# Patient Record
Sex: Female | Born: 1950 | Race: Black or African American | Hispanic: No | Marital: Single | Smoking: Former smoker
Health system: Southern US, Community
[De-identification: ages and names within clinical notes are randomized; demographics above are authoritative.]

## PROBLEM LIST (undated history)

## (undated) DIAGNOSIS — K5790 Diverticulosis of intestine, part unspecified, without perforation or abscess without bleeding: Secondary | ICD-10-CM

## (undated) DIAGNOSIS — D3502 Benign neoplasm of left adrenal gland: Secondary | ICD-10-CM

## (undated) DIAGNOSIS — E042 Nontoxic multinodular goiter: Secondary | ICD-10-CM

## (undated) DIAGNOSIS — K649 Unspecified hemorrhoids: Secondary | ICD-10-CM

## (undated) DIAGNOSIS — C539 Malignant neoplasm of cervix uteri, unspecified: Secondary | ICD-10-CM

## (undated) DIAGNOSIS — E785 Hyperlipidemia, unspecified: Secondary | ICD-10-CM

## (undated) DIAGNOSIS — I517 Cardiomegaly: Secondary | ICD-10-CM

## (undated) DIAGNOSIS — Z8679 Personal history of other diseases of the circulatory system: Secondary | ICD-10-CM

## (undated) DIAGNOSIS — C189 Malignant neoplasm of colon, unspecified: Secondary | ICD-10-CM

## (undated) DIAGNOSIS — Z9889 Other specified postprocedural states: Secondary | ICD-10-CM

## (undated) DIAGNOSIS — E119 Type 2 diabetes mellitus without complications: Secondary | ICD-10-CM

## (undated) DIAGNOSIS — R112 Nausea with vomiting, unspecified: Secondary | ICD-10-CM

## (undated) DIAGNOSIS — Z803 Family history of malignant neoplasm of breast: Secondary | ICD-10-CM

## (undated) DIAGNOSIS — I10 Essential (primary) hypertension: Secondary | ICD-10-CM

## (undated) DIAGNOSIS — I519 Heart disease, unspecified: Secondary | ICD-10-CM

## (undated) DIAGNOSIS — R7309 Other abnormal glucose: Secondary | ICD-10-CM

## (undated) HISTORY — DX: Malignant neoplasm of colon, unspecified: C18.9

## (undated) HISTORY — DX: Other abnormal glucose: R73.09

## (undated) HISTORY — DX: Family history of malignant neoplasm of breast: Z80.3

## (undated) HISTORY — PX: DILATION AND CURETTAGE OF UTERUS: SHX78

## (undated) HISTORY — DX: Type 2 diabetes mellitus without complications: E11.9

## (undated) HISTORY — PX: ADENOIDECTOMY: SUR15

## (undated) HISTORY — DX: Hyperlipidemia, unspecified: E78.5

---

## 2001-02-08 DIAGNOSIS — Z8679 Personal history of other diseases of the circulatory system: Secondary | ICD-10-CM

## 2001-02-08 HISTORY — DX: Personal history of other diseases of the circulatory system: Z86.79

## 2001-02-28 ENCOUNTER — Ambulatory Visit (HOSPITAL_COMMUNITY): Admission: RE | Admit: 2001-02-28 | Discharge: 2001-02-28 | Payer: Self-pay | Admitting: Family Medicine

## 2001-02-28 ENCOUNTER — Encounter: Payer: Self-pay | Admitting: Family Medicine

## 2001-03-20 ENCOUNTER — Encounter: Admission: RE | Admit: 2001-03-20 | Discharge: 2001-03-20 | Payer: Self-pay | Admitting: Family Medicine

## 2001-03-20 ENCOUNTER — Encounter: Payer: Self-pay | Admitting: Family Medicine

## 2002-06-28 ENCOUNTER — Emergency Department (HOSPITAL_COMMUNITY): Admission: EM | Admit: 2002-06-28 | Discharge: 2002-06-28 | Payer: Self-pay | Admitting: Emergency Medicine

## 2002-10-09 ENCOUNTER — Encounter: Admission: RE | Admit: 2002-10-09 | Discharge: 2002-10-09 | Payer: Self-pay | Admitting: Family Medicine

## 2002-10-09 ENCOUNTER — Encounter: Payer: Self-pay | Admitting: Family Medicine

## 2004-03-27 ENCOUNTER — Other Ambulatory Visit: Admission: RE | Admit: 2004-03-27 | Discharge: 2004-03-27 | Payer: Self-pay | Admitting: Family Medicine

## 2006-05-26 ENCOUNTER — Other Ambulatory Visit: Admission: RE | Admit: 2006-05-26 | Discharge: 2006-05-26 | Payer: Self-pay | Admitting: Family Medicine

## 2006-09-02 ENCOUNTER — Ambulatory Visit (HOSPITAL_COMMUNITY): Admission: RE | Admit: 2006-09-02 | Discharge: 2006-09-02 | Payer: Self-pay | Admitting: Obstetrics and Gynecology

## 2006-09-02 ENCOUNTER — Encounter (INDEPENDENT_AMBULATORY_CARE_PROVIDER_SITE_OTHER): Payer: Self-pay | Admitting: Obstetrics and Gynecology

## 2006-09-02 HISTORY — PX: HYSTEROSCOPY WITH D & C: SHX1775

## 2008-02-06 ENCOUNTER — Encounter: Admission: RE | Admit: 2008-02-06 | Discharge: 2008-02-06 | Payer: Self-pay | Admitting: Family Medicine

## 2010-06-18 ENCOUNTER — Other Ambulatory Visit (HOSPITAL_COMMUNITY): Payer: Self-pay | Admitting: Obstetrics and Gynecology

## 2010-06-18 DIAGNOSIS — E049 Nontoxic goiter, unspecified: Secondary | ICD-10-CM

## 2010-06-23 NOTE — Op Note (Signed)
[name], Toni Peterson              ACCOUNT NO.:  [id number]   MEDICAL RECORD NO.:  [id number]          PATIENT TYPE:  AMB   LOCATION:  SDC                           FACILITY:  [facility]   PHYSICIAN:  Juluis Mire, M.D.   DATE OF BIRTH:  1950/11/23   DATE OF PROCEDURE:  09/02/2006  DATE OF DISCHARGE:                               OPERATIVE REPORT   PREOPERATIVE DIAGNOSIS:  Post menopausal bleeding with evidence of  endometrial outgrowths.   POSTOPERATIVE DIAGNOSIS:  Post menopausal bleeding with evidence of  endometrial outgrowths and with evidence of a submucosal fibroid.  No  evidence of polyp.   PROCEDURE:  Paracervical block using Nesacaine, hysteroscopy with  multiple endometrial biopsies and curettings.   SURGEON:  Juluis Mire, M.D.   ANESTHESIA:  General.   ESTIMATED BLOOD LOSS:  Minimal.   PACKS AND DRAINS:  None.   BLOOD REPLACED:  None.   COMPLICATIONS:  None.   INDICATIONS:  See Dictated history and physical.   PROCEDURE IN DETAIL:  The patient was taken to the OR and placed in a  supine position.  After a satisfactory level of general anesthesia was  obtained, the patient was placed in the dorsal lithotomy position using  the Kober stirrups.  The perineum and vagina were prepped out with  Betadine and draped in a sterile field.  A speculum was placed in the  vaginal vault.  The cervix was grasped with a single tooth tenaculum.  A  paracervical block was instituted using 1% Nesacaine.  The uterus  sounded to 8 cm.  The cervix was serially dilated to a size 35 Pratt  dilator.  The operative hysteroscope was introduced.  Visualization  revealed some old clot in the intrauterine cavity that was removed.  Visualization revealed a smooth atrophic endometrium but she did have a  large submucosal fibroid extending from the right side of the uterus and  posteriorly. We did multiple biopsies of the fibroids as well as  multiple endometrial biopsies.  I could not see any  polyps that had been  noted on saline infusion, this may have just been part of the fibroid.  At this point in time, we endometrial curettings.  Total deficit was 60  mL.  We had no active  bleeding or signs of perforation.  The single tooth tenaculum and  speculum were then removed.  The patient was taken out of the dorsal  lithotomy position, once alert and extubated, was transferred to the  recovery room in good condition.      Juluis Mire, M.D.  Electronically Signed     JSM/MEDQ  D:  09/02/2006  T:  09/03/2006  Job:  [id number]

## 2010-06-23 NOTE — H&P (Signed)
NAME:  Toni, Peterson NO.:  [id number]   MEDICAL RECORD NO.:  [id number]          PATIENT TYPE:  AMB   LOCATION:  SDC                           [facility]:  [facility]   PHYSICIAN:  Juluis Mire, M.D.   DATE OF BIRTH:  August 03, 1950   DATE OF ADMISSION:  09/02/2006  DATE OF DISCHARGE:                              HISTORY & PHYSICAL   The patient is a 60-year gravida 5, para 1, abortus 4, married female,  presents for hysteroscopy.  She was referred to our practice for  postmenopausal bleeding.  A saline infusion ultrasound did reveal a  large intrauterine fibroid with myometrial component, but she also had a  couple of polyps noted.  In view of this now, she presents for  hysteroscopic evaluation.   ALLERGIES:  IN TERMS OF ALLERGIES, SHE IS INTOLERANT TO DILACOR, NORVASC  AND BIAXIN.   PAST MEDICAL HISTORY:  She is being actively managed by Dr. Merri Brunette for hypertension.   PAST SURGICAL HISTORY:  She has had an adenoidectomy.   OBSTETRICAL HISTORY:  Had one vaginal delivery.  The rest were  miscarriages.   SOCIAL HISTORY:  She has no history of tobacco use.  Does have rare  alcohol use.   FAMILY HISTORY:  Positive for hypertension in mother, brother, father at  maternal grandmother.  There is also history of coronary artery disease  in her mother at age 82.  Brother had a stroke at age 85.   REVIEW OF SYSTEMS:  Noncontributory.   PHYSICAL EXAMINATION:  The patient is afebrile, stable vital signs.  HEENT:  The patient is normocephalic.  Pupils equal, round, reactive to  light and accommodation.  Extraocular movements were intact.  Sclerae  and conjunctivae are clear.  Oropharynx clear.  NECK:  Without thyromegaly.  BREASTS:  Not examined.  LUNGS:  Clear.  CARDIAC SYSTEM:  Regular rate without murmurs or gallops.  ABDOMINAL EXAM:  Benign.  No mass, organomegaly or tenderness.  PELVIC:  Normal external genitalia.  Vaginal mucosa clear.  Cervix  remarkable.   Uterus upper limits of normal size.  Adnexa unremarkable.  EXTREMITIES:  Trace edema.  NEUROLOGIC:  Grossly within normal limits.   IMPRESSION:  1. Postmenopausal bleeding with endometrial polyp and fibroid.  2. Hypertension.   PLAN:  The patient to undergo hysteroscopic evaluation with resection of  polyp.  The risks of surgery have been discussed, including the risk of  infection.  The risk of hemorrhage could require transfusion with the  risk of AIDS or hepatitis.  Risk of injury to adjacent organs through  perforation could require exploratory surgery, risk of deep venous  thrombosis and pulmonary embolus.  The patient expressed understanding  of indications and risks.      Juluis Mire, M.D.  Electronically Signed     JSM/MEDQ  D:  09/02/2006  T:  09/02/2006  Job:  [id number]

## 2010-06-25 ENCOUNTER — Other Ambulatory Visit (HOSPITAL_COMMUNITY): Payer: Self-pay

## 2010-07-02 ENCOUNTER — Other Ambulatory Visit (HOSPITAL_COMMUNITY): Payer: Self-pay

## 2010-07-02 ENCOUNTER — Ambulatory Visit (HOSPITAL_COMMUNITY)
Admission: RE | Admit: 2010-07-02 | Discharge: 2010-07-02 | Disposition: A | Discharge status: Home / Self Care | Payer: BC Managed Care – PPO | Source: Ambulatory Visit | Attending: Obstetrics and Gynecology | Admitting: Obstetrics and Gynecology

## 2010-07-02 DIAGNOSIS — E049 Nontoxic goiter, unspecified: Secondary | ICD-10-CM

## 2010-07-02 DIAGNOSIS — E042 Nontoxic multinodular goiter: Secondary | ICD-10-CM | POA: Insufficient documentation

## 2010-07-24 ENCOUNTER — Other Ambulatory Visit: Payer: Self-pay | Admitting: Obstetrics and Gynecology

## 2010-07-24 DIAGNOSIS — R928 Other abnormal and inconclusive findings on diagnostic imaging of breast: Secondary | ICD-10-CM

## 2010-07-28 ENCOUNTER — Other Ambulatory Visit: Payer: Self-pay | Admitting: Obstetrics and Gynecology

## 2010-07-28 DIAGNOSIS — R928 Other abnormal and inconclusive findings on diagnostic imaging of breast: Secondary | ICD-10-CM

## 2010-07-30 ENCOUNTER — Other Ambulatory Visit: Payer: Self-pay | Admitting: Obstetrics and Gynecology

## 2010-07-30 DIAGNOSIS — R928 Other abnormal and inconclusive findings on diagnostic imaging of breast: Secondary | ICD-10-CM

## 2010-08-07 ENCOUNTER — Ambulatory Visit
Admission: RE | Admit: 2010-08-07 | Discharge: 2010-08-07 | Disposition: A | Discharge status: Home / Self Care | Payer: BC Managed Care – PPO | Source: Ambulatory Visit | Attending: Obstetrics and Gynecology | Admitting: Obstetrics and Gynecology

## 2010-08-07 DIAGNOSIS — R928 Other abnormal and inconclusive findings on diagnostic imaging of breast: Secondary | ICD-10-CM

## 2010-08-14 ENCOUNTER — Other Ambulatory Visit: Payer: Self-pay | Admitting: Endocrinology

## 2010-08-14 DIAGNOSIS — E041 Nontoxic single thyroid nodule: Secondary | ICD-10-CM

## 2010-08-21 ENCOUNTER — Inpatient Hospital Stay (HOSPITAL_COMMUNITY): Admission: RE | Admit: 2010-08-21 | Payer: BC Managed Care – PPO | Source: Ambulatory Visit

## 2010-08-25 ENCOUNTER — Ambulatory Visit
Admission: RE | Admit: 2010-08-25 | Discharge: 2010-08-25 | Disposition: A | Discharge status: Home / Self Care | Payer: BC Managed Care – PPO | Source: Ambulatory Visit | Attending: Endocrinology | Admitting: Endocrinology

## 2010-08-25 ENCOUNTER — Other Ambulatory Visit (HOSPITAL_COMMUNITY)
Admission: RE | Admit: 2010-08-25 | Discharge: 2010-08-25 | Disposition: A | Discharge status: Home / Self Care | Payer: BC Managed Care – PPO | Source: Ambulatory Visit | Attending: Interventional Radiology | Admitting: Interventional Radiology

## 2010-08-25 DIAGNOSIS — E049 Nontoxic goiter, unspecified: Secondary | ICD-10-CM | POA: Insufficient documentation

## 2010-08-25 DIAGNOSIS — E041 Nontoxic single thyroid nodule: Secondary | ICD-10-CM

## 2010-08-28 ENCOUNTER — Encounter (HOSPITAL_COMMUNITY): Admission: RE | Payer: Self-pay | Source: Ambulatory Visit

## 2010-08-28 ENCOUNTER — Ambulatory Visit (HOSPITAL_COMMUNITY)
Admission: RE | Admit: 2010-08-28 | Payer: BC Managed Care – PPO | Source: Ambulatory Visit | Admitting: Obstetrics and Gynecology

## 2010-08-28 SURGERY — DILATATION & CURETTAGE/HYSTEROSCOPY WITH RESECTOCOPE
Anesthesia: General

## 2010-09-01 ENCOUNTER — Other Ambulatory Visit: Payer: Self-pay | Admitting: Obstetrics and Gynecology

## 2010-11-23 LAB — CBC
HCT: 35.7 — ABNORMAL LOW
MCHC: 33.5
MCV: 93.6
Platelets: 339
RBC: 3.81 — ABNORMAL LOW

## 2011-02-09 HISTORY — PX: COLONOSCOPY: SHX174

## 2011-03-05 ENCOUNTER — Encounter (HOSPITAL_BASED_OUTPATIENT_CLINIC_OR_DEPARTMENT_OTHER): Payer: Self-pay | Admitting: *Deleted

## 2011-03-08 ENCOUNTER — Encounter (HOSPITAL_BASED_OUTPATIENT_CLINIC_OR_DEPARTMENT_OTHER): Payer: Self-pay | Admitting: *Deleted

## 2011-03-08 NOTE — Progress Notes (Signed)
Pt instructed NPO p MN 1/30 x atenolol w/ sip of h20.  To [facility] 1/31 @ 0600.  Needs ekg, cbc, cmet, serum hcg on arrival.

## 2011-03-08 NOTE — H&P (Signed)
  Patient name  Toni Peterson DICTATION#  [id number] CSN# [id number]  Juluis Mire, MD 03/08/2011 11:59 AM

## 2011-03-09 NOTE — H&P (Signed)
Toni Peterson, Toni Peterson NO.:  [id number]  MEDICAL RECORD NO.:  [id number]  LOCATION:                               FACILITY:  [facility]  PHYSICIAN:  Juluis Mire, M.D.   DATE OF BIRTH:  1950/05/02  DATE OF ADMISSION:  03/11/2011 DATE OF DISCHARGE:                             HISTORY & PHYSICAL   DATE OF SURGERY:  March 11, 2011.  Surgery is going to be done at [facility] outpatient area on [facility].  HISTORY:  The patient is a 61 year old, gravida 5, para 1, abortus 4 postmenopausal patient who presents for hysteroscopy and D and C.  The patient has been having trouble with abnormal bleeding.  Previous blood tests were consistent with menopause.  A saline infusion ultrasound had been done previously and revealed an endometrial polyp. She was scheduled for hysteroscopy, which she never followed through with.  We repeated the saline infusion ultrasound on January 17.  She had submucosal fibroid and this was seen previously.  It had enlarged slightly, but she had an enlarging apparent endometrial polyp now measuring 2.2 cm.  In view of that she now presents for hysteroscopic resection.  ALLERGIES:  No known drug allergies.  MEDICATIONS:  She is on enalapril, hydrochlorothiazide, Tenormin and Crestor.  PAST MEDICAL HISTORY:  She has a history of hypertension, hypercholesterolemia.  She is under active management for this.  PAST SURGICAL HISTORY:  She has had a previous hysteroscopic evaluation resection.  She has had a previous adenoidectomy.  OBSTETRICAL HISTORY:  She has had 1 vaginal delivery.  The rest 4 miscarriages.  SOCIAL HISTORY:  Reveals no tobacco or alcohol use.  FAMILY HISTORY:  She has a family history of hypertension in her mother. Also brother, father, maternal grandmother had hypertension.  History of coronary artery disease in her mother at age 87.  Brother had a stroke at age 69.  REVIEW OF SYSTEMS:   Noncontributory.  PHYSICAL EXAMINATION:  VITAL SIGNS:  The patient is afebrile, stable vital signs. HEENT:  The patient is normocephalic.  Pupils equal, round, reactive to light and accommodation.  Extraocular movements intact.  Sclerae clear. Oropharynx clear. NECK:  Without thyromegaly. BREASTS:  No discrete masses. LUNGS:  Clear. CARDIOVASCULAR:  Regular rhythm, rate.  There are no murmurs or gallops. ABDOMINAL:  Exam is benign.  No masses, organomegaly, or tenderness. PELVIC:  Normal external genitalia.  Vaginal mucosa clear.  Cervix unremarkable.  Uterus, upper limits of normal size.  Adnexa are unremarkable. EXTREMITIES:  Trace edema. NEUROLOGIC:  Exam is grossly within normal limits.  IMPRESSION: 1. Large endometrial polyp. 2. Submucosal fibroid. 3. Hypertension. 4. Hypercholesterolemia.  PLAN:  The patient will undergo a hysteroscopic evaluation with resection along with dilation and curettage.  Risks of procedure have been discussed including the risk of infection.  Risk of vascular injury that could lead to hemorrhage requiring transfusion with the risk of AIDS or hepatitis.  Excessive bleeding could require hysterectomy. There is a risk of uterine perforation leading to injury to adjacent organs such as bowel, bladder, ureters that could require further exploratory surgery.  Risk of deep venous thrombosis and pulmonary emboli.  The patient does understand  potential risks, and complications.     Juluis Mire, M.D.     JSM/MEDQ  D:  03/08/2011  T:  03/08/2011  Job:  [id number]

## 2011-03-10 NOTE — Anesthesia Preprocedure Evaluation (Addendum)
Anesthesia Evaluation  Patient identified by MRN, date of birth, ID band Patient awake    Reviewed: Allergy & Precautions, H&P , NPO status , Patient's Chart, lab work & pertinent test results, reviewed documented beta blocker date and time   History of Anesthesia Complications (+) PONV  Airway Mallampati: II TM Distance: >3 FB Neck ROM: full    Dental No notable dental hx. (+) Teeth Intact and Dental Advisory Given   Pulmonary neg pulmonary ROS,  clear to auscultation  Pulmonary exam normal       Cardiovascular Exercise Tolerance: Good hypertension, On Home Beta Blockers regular Normal Junctional rhythm on ECG.   Neuro/Psych Negative Neurological ROS  Negative Psych ROS   GI/Hepatic negative GI ROS, Neg liver ROS,   Endo/Other  Negative Endocrine ROS  Renal/GU negative Renal ROS  Genitourinary negative   Musculoskeletal   Abdominal   Peds  Hematology negative hematology ROS (+)   Anesthesia Other Findings   Reproductive/Obstetrics negative OB ROS                          Anesthesia Physical Anesthesia Plan  ASA: II  Anesthesia Plan: General   Post-op Pain Management:    Induction: Intravenous  Airway Management Planned: LMA  Additional Equipment:   Intra-op Plan:   Post-operative Plan:   Informed Consent: I have reviewed the patients History and Physical, chart, labs and discussed the procedure including the risks, benefits and alternatives for the proposed anesthesia with the patient or authorized representative who has indicated his/her understanding and acceptance.   Dental Advisory Given  Plan Discussed with: CRNA and Surgeon  Anesthesia Plan Comments:         Anesthesia Quick Evaluation

## 2011-03-11 ENCOUNTER — Other Ambulatory Visit: Payer: Self-pay | Admitting: Obstetrics and Gynecology

## 2011-03-11 ENCOUNTER — Other Ambulatory Visit: Payer: Self-pay

## 2011-03-11 ENCOUNTER — Encounter (HOSPITAL_BASED_OUTPATIENT_CLINIC_OR_DEPARTMENT_OTHER)
Admission: RE | Disposition: A | Discharge status: Home / Self Care | Payer: Self-pay | Source: Ambulatory Visit | Attending: Obstetrics and Gynecology

## 2011-03-11 ENCOUNTER — Ambulatory Visit (HOSPITAL_BASED_OUTPATIENT_CLINIC_OR_DEPARTMENT_OTHER): Payer: BC Managed Care – PPO | Admitting: Anesthesiology

## 2011-03-11 ENCOUNTER — Encounter (HOSPITAL_BASED_OUTPATIENT_CLINIC_OR_DEPARTMENT_OTHER): Payer: Self-pay | Admitting: Anesthesiology

## 2011-03-11 ENCOUNTER — Ambulatory Visit (HOSPITAL_BASED_OUTPATIENT_CLINIC_OR_DEPARTMENT_OTHER)
Admission: RE | Admit: 2011-03-11 | Discharge: 2011-03-11 | Disposition: A | Discharge status: Home / Self Care | Payer: BC Managed Care – PPO | Source: Ambulatory Visit | Attending: Obstetrics and Gynecology | Admitting: Obstetrics and Gynecology

## 2011-03-11 ENCOUNTER — Encounter (HOSPITAL_BASED_OUTPATIENT_CLINIC_OR_DEPARTMENT_OTHER): Payer: Self-pay | Admitting: *Deleted

## 2011-03-11 DIAGNOSIS — D250 Submucous leiomyoma of uterus: Secondary | ICD-10-CM | POA: Insufficient documentation

## 2011-03-11 DIAGNOSIS — Z79899 Other long term (current) drug therapy: Secondary | ICD-10-CM | POA: Insufficient documentation

## 2011-03-11 DIAGNOSIS — E7800 Pure hypercholesterolemia, unspecified: Secondary | ICD-10-CM | POA: Insufficient documentation

## 2011-03-11 DIAGNOSIS — D259 Leiomyoma of uterus, unspecified: Secondary | ICD-10-CM

## 2011-03-11 DIAGNOSIS — I10 Essential (primary) hypertension: Secondary | ICD-10-CM | POA: Insufficient documentation

## 2011-03-11 HISTORY — DX: Nausea with vomiting, unspecified: R11.2

## 2011-03-11 HISTORY — DX: Other specified postprocedural states: Z98.890

## 2011-03-11 HISTORY — DX: Essential (primary) hypertension: I10

## 2011-03-11 HISTORY — PX: DILATION AND CURETTAGE OF UTERUS: SHX78

## 2011-03-11 HISTORY — PX: HYSTEROSCOPY WITH RESECTOSCOPE: SHX5395

## 2011-03-11 LAB — CBC
HCT: 35.8 % — ABNORMAL LOW (ref 36.0–46.0)
MCHC: 33.2 g/dL (ref 30.0–36.0)
RDW: 14.3 % (ref 11.5–15.5)

## 2011-03-11 LAB — COMPREHENSIVE METABOLIC PANEL
ALT: 18 U/L (ref 0–35)
Calcium: 9.8 mg/dL (ref 8.4–10.5)
GFR calc Af Amer: >90 mL/min (ref >90)
Glucose, Bld: 118 mg/dL — ABNORMAL HIGH (ref 70–99)
Sodium: 139 mEq/L (ref 135–145)
Total Protein: 7.2 g/dL (ref 6.0–8.3)

## 2011-03-11 LAB — HCG, SERUM, QUALITATIVE: Preg, Serum: NEGATIVE

## 2011-03-11 SURGERY — HYSTEROSCOPY, USING RESECTOSCOPE
Anesthesia: General | Site: Vagina | Wound class: Clean Contaminated

## 2011-03-11 MED ORDER — LACTATED RINGERS IV SOLN: INTRAVENOUS | Status: DC

## 2011-03-11 MED ORDER — GLYCINE 1.5 % IR SOLN
Status: DC | PRN
  Administered 2011-03-11: 3000 mL

## 2011-03-11 MED ORDER — EPHEDRINE SULFATE 50 MG/ML IJ SOLN
INTRAMUSCULAR | Status: DC | PRN
  Administered 2011-03-11: 10 mg via INTRAVENOUS

## 2011-03-11 MED ORDER — PROPOFOL 10 MG/ML IV EMUL
INTRAVENOUS | Status: DC | PRN
  Administered 2011-03-11: 200 mg via INTRAVENOUS

## 2011-03-11 MED ORDER — DEXAMETHASONE SODIUM PHOSPHATE 4 MG/ML IJ SOLN
INTRAMUSCULAR | Status: DC | PRN
  Administered 2011-03-11: 8 mg via INTRAVENOUS

## 2011-03-11 MED ORDER — OXYCODONE-ACETAMINOPHEN 5-325 MG PO TABS
1.0000 | ORAL_TABLET | ORAL | Status: AC | PRN
  Administered 2011-03-11: 1 via ORAL

## 2011-03-11 MED ORDER — LIDOCAINE HCL 1 % IJ SOLN
INTRAMUSCULAR | Status: DC | PRN
  Administered 2011-03-11: 18 mL

## 2011-03-11 MED ORDER — MIDAZOLAM HCL 5 MG/5ML IJ SOLN
INTRAMUSCULAR | Status: DC | PRN
  Administered 2011-03-11: 2 mg via INTRAVENOUS

## 2011-03-11 MED ORDER — ONDANSETRON HCL 4 MG/2ML IJ SOLN
INTRAMUSCULAR | Status: DC | PRN
  Administered 2011-03-11: 4 mg via INTRAVENOUS

## 2011-03-11 MED ORDER — LIDOCAINE HCL (CARDIAC) 20 MG/ML IV SOLN
INTRAVENOUS | Status: DC | PRN
  Administered 2011-03-11: 100 mg via INTRAVENOUS

## 2011-03-11 MED ORDER — FENTANYL CITRATE 0.05 MG/ML IJ SOLN
INTRAMUSCULAR | Status: DC | PRN
  Administered 2011-03-11: 50 ug via INTRAVENOUS
  Administered 2011-03-11: 25 ug via INTRAVENOUS

## 2011-03-11 MED ORDER — PROMETHAZINE HCL 25 MG/ML IJ SOLN: 6.2500 mg | INTRAMUSCULAR | Status: DC | PRN

## 2011-03-11 MED ORDER — FENTANYL CITRATE 0.05 MG/ML IJ SOLN: 25.0000 ug | INTRAMUSCULAR | Status: DC | PRN

## 2011-03-11 MED ORDER — LACTATED RINGERS IV SOLN
INTRAVENOUS | Status: DC
  Administered 2011-03-11: 100 mL/h via INTRAVENOUS

## 2011-03-11 SURGICAL SUPPLY — 36 items
CANISTER SUCTION 2500CC (MISCELLANEOUS) ×2 IMPLANT
CATH ROBINSON RED A/P 16FR (CATHETERS) IMPLANT
CLOTH BEACON ORANGE TIMEOUT ST (SAFETY) ×2 IMPLANT
CORD ACTIVE DISPOSABLE (ELECTRODE) ×1
CORD ELECTRO ACTIVE DISP (ELECTRODE) IMPLANT
COVER TABLE BACK 60X90 (DRAPES) ×2 IMPLANT
DRAPE CAMERA CLOSED 9X96 (DRAPES) ×2 IMPLANT
DRAPE LG THREE QUARTER DISP (DRAPES) ×2 IMPLANT
DRESSING TELFA 8X3 (GAUZE/BANDAGES/DRESSINGS) ×2 IMPLANT
ELECT LOOP GYNE PRO 24FR (CUTTING LOOP)
ELECT REM PT RETURN 9FT ADLT (ELECTROSURGICAL) ×2
ELECT VAPORTRODE GRVD BAR (ELECTRODE) IMPLANT
ELECTRODE LOOP GYNE PRO 24FR (CUTTING LOOP) IMPLANT
ELECTRODE REM PT RTRN 9FT ADLT (ELECTROSURGICAL) ×1 IMPLANT
ELECTRODE ROLLER BARREL 22FR (ELECTROSURGICAL) IMPLANT
ELECTRODE VAPORCUT 22FR (ELECTROSURGICAL) IMPLANT
GLOVE BIO SURGEON STRL SZ7 (GLOVE) ×4 IMPLANT
GLOVE BIOGEL PI IND STRL 6.5 (GLOVE) IMPLANT
GLOVE BIOGEL PI IND STRL 7.0 (GLOVE) IMPLANT
GLOVE BIOGEL PI INDICATOR 6.5 (GLOVE) ×2
GLOVE BIOGEL PI INDICATOR 7.0 (GLOVE) ×1
GLOVE ECLIPSE 7.0 STRL STRAW (GLOVE) ×1 IMPLANT
GOWN PREVENTION PLUS LG XLONG (DISPOSABLE) ×4 IMPLANT
GOWN STRL NON-REIN LRG LVL3 (GOWN DISPOSABLE) ×2 IMPLANT
LEGGING LITHOTOMY PAIR STRL (DRAPES) ×2 IMPLANT
LOOP ANGLED CUTTING 22FR (CUTTING LOOP) ×1 IMPLANT
NDL SPNL 18GX3.5 QUINCKE PK (NEEDLE) IMPLANT
NDL SPNL 22GX3.5 QUINCKE BK (NEEDLE) IMPLANT
NEEDLE SPNL 18GX3.5 QUINCKE PK (NEEDLE) ×2 IMPLANT
NEEDLE SPNL 22GX3.5 QUINCKE BK (NEEDLE) ×2 IMPLANT
PACK BASIN DAY SURGERY FS (CUSTOM PROCEDURE TRAY) ×2 IMPLANT
PAD OB MATERNITY 4.3X12.25 (PERSONAL CARE ITEMS) ×2 IMPLANT
PAD PREP 24X48 CUFFED NSTRL (MISCELLANEOUS) ×2 IMPLANT
TOWEL OR 17X24 6PK STRL BLUE (TOWEL DISPOSABLE) ×4 IMPLANT
TUBING HYDROFLEX HYSTEROSCOPY (TUBING) ×2 IMPLANT
WATER STERILE IRR 500ML POUR (IV SOLUTION) ×2 IMPLANT

## 2011-03-11 NOTE — Brief Op Note (Signed)
03/11/2011  7:58 AM  PATIENT:  Blima Rich  61 y.o. female  PRE-OPERATIVE DIAGNOSIS:  endometrial polyp  POST-OPERATIVE DIAGNOSIS:  endometrial polyp  PROCEDURE:  Procedure(s): HYSTEROSCOPY WITH RESECTOSCOPE  SURGEON:  Surgeon(s): Juluis Mire, MD  PHYSICIAN ASSISTANT:   ASSISTANTS: none   ANESTHESIA:   local and general  EBL:  Total I/O In: 100 [I.V.:100] Out: -   BLOOD ADMINISTERED:none  DRAINS: none   LOCAL MEDICATIONS USED:  XYLOCAINE 18CC  SPECIMEN:  Source of Specimen:  submucosal fibroid and curretting  DISPOSITION OF SPECIMEN:  PATHOLOGY  COUNTS:  YES  TOURNIQUET:  * No tourniquets in log *  DICTATION: .Other Dictation: Dictation Number [id number]  PLAN OF CARE: Discharge to home after PACU  PATIENT DISPOSITION:  PACU - hemodynamically stable.   Delay start of Pharmacological VTE agent (>24hrs) due to surgical blood loss or risk of bleeding:  {YES/NO/NOT APPLICABLE:20182

## 2011-03-11 NOTE — Anesthesia Procedure Notes (Signed)
Procedure Name: LMA Insertion Date/Time: 03/11/2011 7:33 AM Performed by: Renella Cunas D Pre-anesthesia Checklist: Patient identified, Emergency Drugs available, Suction available and Patient being monitored Patient Re-evaluated:Patient Re-evaluated prior to inductionOxygen Delivery Method: Circle System Utilized Preoxygenation: Pre-oxygenation with 100% oxygen Intubation Type: IV induction Ventilation: Mask ventilation without difficulty LMA: LMA inserted LMA Size: 4.0 Number of attempts: 1 Airway Equipment and Method: bite block Placement Confirmation: positive ETCO2 Tube secured with: Tape Dental Injury: Teeth and Oropharynx as per pre-operative assessment

## 2011-03-11 NOTE — Anesthesia Postprocedure Evaluation (Signed)
  Anesthesia Post-op Note  Patient: Toni Peterson  Procedure(s) Performed:  HYSTEROSCOPY WITH RESECTOSCOPE; DILATATION AND CURETTAGE  Patient Location: PACU  Anesthesia Type: General  Level of Consciousness: awake and alert   Airway and Oxygen Therapy: Patient Spontanous Breathing  Post-op Pain: mild  Post-op Assessment: Post-op Vital signs reviewed, Patient's Cardiovascular Status Stable, Respiratory Function Stable, Patent Airway and No signs of Nausea or vomiting  Post-op Vital Signs: stable  Complications: No apparent anesthesia complications

## 2011-03-11 NOTE — H&P (Signed)
  History and physical exam unchanged 

## 2011-03-11 NOTE — Transfer of Care (Signed)
Immediate Anesthesia Transfer of Care Note  Patient: Toni Peterson  Procedure(s) Performed:  HYSTEROSCOPY WITH RESECTOSCOPE; DILATATION AND CURETTAGE  Patient Location: PACU  Anesthesia Type: General  Level of Consciousness: awake, oriented, sedated and patient cooperative  Airway & Oxygen Therapy: Patient Spontanous Breathing and Patient connected to face mask oxygen  Post-op Assessment: Report given to PACU RN and Post -op Vital signs reviewed and stable  Post vital signs: Reviewed and stable  Complications: No apparent anesthesia complications

## 2011-03-11 NOTE — Op Note (Signed)
NAMEMarland Kitchen  Toni, Peterson NO.:  [id number]  MEDICAL RECORD NO.:  [id number]  LOCATION:                                 FACILITY:  PHYSICIAN:  Juluis Mire, M.D.        DATE OF BIRTH:  DATE OF PROCEDURE:  03/11/2011 DATE OF DISCHARGE:                              OPERATIVE REPORT   PREOPERATIVE DIAGNOSIS:  Endometrial polyp and fibroids.  POSTOPERATIVE DIAGNOSIS:  Submucosal fibroids.  OPERATIVE PROCEDURE: 1. Paracervical block. 2. Hysteroscopy with resection of submucosal fibroid. 3. Endometrial curettings.  SURGEON:  Juluis Mire, M.D.  ANESTHESIA:  Paracervical block and general anesthesia.  ESTIMATED BLOOD LOSS:  Minimal.  PACKS AND DRAINS:  None.  INTRAOPERATIVE BLOOD PLACED:  None.  COMPLICATIONS:  None.  INDICATION:  Dictated in history and physical.  PROCEDURE IN DETAIL:  Patient was taken to the OR, placed in supine position.  After satisfactory level of general anesthesia obtained, the patient was placed in a dorsal lithotomy position using the Fromer stirrups.  Perineum and vagina prepped out with Betadine and draped sterile fields.  Speculum was placed in vaginal vault.  Cervix grasped with single-toothed tenaculum.  Paracervical block 1% Xylocaine was instituted, uterus sounded to 9 cm, cervix serially dilated to a size 33- Pratt dilator.  Operative hysteroscope was introduced and intrauterine cavity was distended using glycine.  Visualization; she had a fundal submucosal fibroid, in the lower segment was another submucosal fibroid that was mistakenly thought to be a polyp.  The lower segment fibroid was resected and sent for pathology.  We did multiple biopsies from the larger fundal fibroid.  These were sent for pathology.  We also obtained endometrial curettings.  Endometrium was otherwise clear.  There were no signs of perforation.  Total deficit was 50 cc.  At this point in time, the single-tooth tenaculum speculum then removed.   Patient taken out of the dorsal lithotomy position.  Once alert and extubated, transferred to recovery room in good condition.  Sponge, instrument, and needle count reported as correct by circulating nurse.     Juluis Mire, M.D.     JSM/MEDQ  D:  03/11/2011  T:  03/11/2011  Job:  [id number]

## 2011-03-11 NOTE — Op Note (Signed)
Patient name  Toni Peterson, Toni Peterson DICTATION#  [id number] CSN# [id number]  Juluis Mire, MD 03/11/2011 8:02 AM

## 2011-03-12 ENCOUNTER — Encounter (HOSPITAL_BASED_OUTPATIENT_CLINIC_OR_DEPARTMENT_OTHER): Payer: Self-pay | Admitting: Obstetrics and Gynecology

## 2011-03-12 NOTE — Addendum Note (Signed)
Addendum  created 03/12/11 1112 by Eriyanna Kofoed L Rayleen Wyrick, MD   Modules edited:Anesthesia Responsible Staff    

## 2011-03-12 NOTE — Addendum Note (Signed)
Addendum  created 03/12/11 1112 by Gaetano Hawthorne, MD   Modules edited:Anesthesia Responsible Staff

## 2012-11-03 ENCOUNTER — Encounter: Payer: Self-pay | Admitting: Cardiology

## 2012-11-03 ENCOUNTER — Encounter: Payer: Self-pay | Admitting: *Deleted

## 2012-11-03 DIAGNOSIS — I10 Essential (primary) hypertension: Secondary | ICD-10-CM | POA: Insufficient documentation

## 2012-11-03 DIAGNOSIS — R7309 Other abnormal glucose: Secondary | ICD-10-CM | POA: Insufficient documentation

## 2012-11-03 DIAGNOSIS — E785 Hyperlipidemia, unspecified: Secondary | ICD-10-CM | POA: Insufficient documentation

## 2012-11-03 DIAGNOSIS — R112 Nausea with vomiting, unspecified: Secondary | ICD-10-CM | POA: Insufficient documentation

## 2012-11-03 DIAGNOSIS — Z9889 Other specified postprocedural states: Secondary | ICD-10-CM

## 2012-11-10 ENCOUNTER — Encounter: Payer: Self-pay | Admitting: Cardiology

## 2012-11-10 ENCOUNTER — Ambulatory Visit (INDEPENDENT_AMBULATORY_CARE_PROVIDER_SITE_OTHER): Payer: BC Managed Care – PPO | Admitting: Cardiology

## 2012-11-10 VITALS — BP 150/90 | HR 51 | Ht 61.0 in | Wt 183.0 lb

## 2012-11-10 DIAGNOSIS — E785 Hyperlipidemia, unspecified: Secondary | ICD-10-CM

## 2012-11-10 DIAGNOSIS — Z789 Other specified health status: Secondary | ICD-10-CM

## 2012-11-10 DIAGNOSIS — Z888 Allergy status to other drugs, medicaments and biological substances status: Secondary | ICD-10-CM

## 2012-11-10 DIAGNOSIS — E669 Obesity, unspecified: Secondary | ICD-10-CM

## 2012-11-10 DIAGNOSIS — I10 Essential (primary) hypertension: Secondary | ICD-10-CM

## 2012-11-10 NOTE — Progress Notes (Addendum)
[address], [address] [place], [place]  [address] Phone: [phone] Fax:  [phone]  Date:  11/10/2012   ID:  Toni Peterson, DOB 1950-12-30, MRN [id number]  PCP:  No primary provider on file.   History of Present Illness: Toni Peterson is a 62 y.o. female with statin intolerance. Since January of last year (tried Crestor once a week now off), who feels like she has done well with diet exercise, down 15 pounds.   Tried 10mg  but felt knee pain. When she stopped felt better.  Tried 5mg  as well but still with same results.  For a month now, tried 5mg  again. Taking on Sunday. Now doing OK but skeptical.   HgA1c went up. Dr. Azucena Cecil gave her a meter. Not quite 7 but close. Been 104 to 124 (one day only).   Brother had massive stroke at age 40.   Professor at [employer].    Wt Readings from Last 3 Encounters:  11/10/12 183 lb (83.008 kg)  03/08/11 190 lb (86.183 kg)  03/08/11 190 lb (86.183 kg)     Past Medical History  Diagnosis Date  . Hypertension   . PONV (postoperative nausea and vomiting)     slow awakening after dental visit  . Hyperlipidemia   . Other abnormal glucose   . Diabetes     Past Surgical History  Procedure Laterality Date  . Hysteroscopy w/d&c  09-02-2006    ENDOMETRIAL BX'S  . Dilation and curettage of uterus    . Adenoidectomy    . Hysteroscopy with resectoscope  03/11/2011    Procedure: HYSTEROSCOPY WITH RESECTOSCOPE;  Surgeon: Juluis Mire, MD;  [facility];  Service: Gynecology;  Laterality: N/A;  . Dilation and curettage of uterus  03/11/2011    Procedure: DILATATION AND CURETTAGE;  Surgeon: Juluis Mire, MD;  Location: [facility];  Service: Gynecology;  Laterality: N/A;    Current Outpatient Prescriptions  Medication Sig Dispense Refill  . Ascorbic Acid (VITAMIN C) 100 MG tablet Take 100 mg by mouth daily.      Marland Kitchen atenolol (TENORMIN) 25 MG tablet Take by mouth daily.      . beta carotene  w/minerals (OCUVITE) tablet Take 1 tablet by mouth daily.      . enalapril (VASOTEC) 10 MG tablet Take 10 mg by mouth daily.      . fish oil-omega-3 fatty acids 1000 MG capsule Take 2 g by mouth daily.      . hydrochlorothiazide (HYDRODIURIL) 25 MG tablet Take 25 mg by mouth daily.      Marland Kitchen ibuprofen (ADVIL,MOTRIN) 200 MG tablet Take 200 mg by mouth every 6 (six) hours as needed.      . RED YEAST RICE EXTRACT PO Take 1 tablet by mouth daily.       . vitamin B-12 (CYANOCOBALAMIN) 100 MCG tablet Take 50 mcg by mouth daily.      Marland Kitchen zinc sulfate 220 MG capsule Take 220 mg by mouth daily.       No current facility-administered medications for this visit.    Allergies:    Allergies  Allergen Reactions  . Clarithromycin   . Dilacor [Diltiazem Hcl]   . Norvasc [Amlodipine Besylate]     Social History:  The patient  reports that she has never smoked. She has never used smokeless tobacco. She reports that she does not drink alcohol or use illicit drugs.   ROS:  Please see  the history of present illness.  Denies any bleeding, syncope, orthopnea, PND   All other systems reviewed and negative.   PHYSICAL EXAM: VS:  BP 150/90  Pulse 51  Ht 5\' 1"  (1.549 m)  Wt 183 lb (83.008 kg)  BMI 34.6 kg/m2 Well nourished, well developed, in no acute distress HEENT: normal Neck: no JVD Cardiac:  normal S1, S2; RRR; no murmur Lungs:  clear to auscultation bilaterally, no wheezing, rhonchi or rales Abd: soft, nontender, no hepatomegalyobese Ext: no edema Skin: warm and dry Neuro: no focal abnormalities noted      ASSESSMENT AND PLAN:  62 year old with family history of stroke, hyperlipidemia, obesity  1. Hyperlipidemia/statin intolerance-she has gone back and forth on taking Crestor and is currently taking Crestor 5 mg once a week. She seems to be tolerating it. Previously had knee pain with higher dosing. With her brothers massive stroke at an early age, LDL of 92, I would like for her to try to  continue at least once a week statin therapy. I will also have her referred to lipid clinic with [facility], Pharm.D. to discuss both nonpharmacologic as well as pharmacologic options. 2. Obesity-she has great job with weight loss.  Signed, Donato Schultz, MD [facility]  11/10/2012 2:32 PM   EKG today shows sinus bradycardia rate 50 with nonspecific T-wave flattening. No significant changes.

## 2012-11-10 NOTE — Patient Instructions (Addendum)
Your physician recommends that you continue on your current medications as directed. Please refer to the Current Medication list given to you today.  Your physician wants you to follow-up in: 1 year with Dr. Algis Liming will receive a reminder letter in the mail two months in advance. If you don't receive a letter, please call our office to schedule the follow-up appointment.  You are being referred to the Lipid Clinic, (Sally/Jeremy)

## 2012-11-17 ENCOUNTER — Ambulatory Visit: Payer: BC Managed Care – PPO | Admitting: Pharmacist

## 2012-12-05 ENCOUNTER — Encounter: Payer: BC Managed Care – PPO | Attending: Family Medicine | Admitting: Dietician

## 2012-12-05 VITALS — Ht 61.5 in | Wt 184.4 lb

## 2012-12-05 DIAGNOSIS — E119 Type 2 diabetes mellitus without complications: Secondary | ICD-10-CM | POA: Insufficient documentation

## 2012-12-05 DIAGNOSIS — Z713 Dietary counseling and surveillance: Secondary | ICD-10-CM | POA: Insufficient documentation

## 2012-12-06 ENCOUNTER — Encounter: Payer: Self-pay | Admitting: Dietician

## 2012-12-06 NOTE — Patient Instructions (Signed)
Goals:  Monitor glucose levels as instructed by your doctor  Bring food record and glucose log to your next nutrition visit 

## 2012-12-06 NOTE — Progress Notes (Signed)
Patient was seen on 12/05/12 for the first of a series of three diabetes self-management courses at the [facility].  Current HbA1c: 6.7% on 8/15  The following learning objectives were met by the patient during this class:  Describe diabetes  State some common risk factors for diabetes  Defines the role of glucose and insulin  Identifies type of diabetes and pathophysiology  Describe the relationship between diabetes and cardiovascular risk  State the members of the Healthcare Team  States the rationale for glucose monitoring  State when to test glucose  State their individual Target Range  State the importance of logging glucose readings  Describe how to interpret glucose readings  Identifies A1C target  Explain the correlation between A1c and eAG values  State symptoms and treatment of high blood glucose  State symptoms and treatment of low blood glucose  Explain proper technique for glucose testing  Identifies proper sharps disposal  Handouts given during class include:  Living Well with Diabetes book  Carb Counting and Meal Planning book  Meal Plan Card  Carbohydrate guide  Meal planning worksheet  Low Sodium Flavoring Tips  The diabetes portion plate  Low Carbohydrate Snack Suggestions  A1c to eAG Conversion Chart  Diabetes Medications  Stress Management  Diabetes Recommended Care Schedule  Diabetes Success Plan  Core Class Satisfaction Survey  Your patient has identified their diabetes care support plan as:  [facility]  Staff   Follow-Up Plan:  Attend core 2

## 2012-12-12 ENCOUNTER — Encounter: Payer: BC Managed Care – PPO | Attending: Family Medicine

## 2012-12-12 DIAGNOSIS — Z713 Dietary counseling and surveillance: Secondary | ICD-10-CM | POA: Insufficient documentation

## 2012-12-12 DIAGNOSIS — E119 Type 2 diabetes mellitus without complications: Secondary | ICD-10-CM

## 2012-12-13 NOTE — Progress Notes (Signed)
Patient was seen on 12/12/12 for the second of a series of three diabetes self-management courses at the [facility]. The following learning objectives were met by the patient during this class:   Describe the role of different macronutrients on glucose  Explain how carbohydrates affect blood glucose  State what foods contain the most carbohydrates  Demonstrate carbohydrate counting  Demonstrate how to read Nutrition Facts food label  Describe effects of various fats on heart health  Describe the importance of good nutrition for health and healthy eating strategies  Describe techniques for managing your shopping, cooking and meal planning  List strategies to follow meal plan when dining out  Describe the effects of alcohol on glucose and how to use it safely  Follow-Up Plan:  Attend Core 3  Work towards following your personal food plan.    

## 2012-12-19 DIAGNOSIS — E119 Type 2 diabetes mellitus without complications: Secondary | ICD-10-CM

## 2012-12-20 NOTE — Progress Notes (Signed)
Patient was seen on 12/19/12 for the third of a series of three diabetes self-management courses at the [facility]. The following learning objectives were met by the patient during this class:    State the amount of activity recommended for healthy living   Describe activities suitable for individual needs   Identify ways to regularly incorporate activity into daily life   Identify barriers to activity and ways to over come these barriers  Identify diabetes medications being personally used and their primary action for lowering glucose and possible side effects   Describe role of stress on blood glucose and develop strategies to address psychosocial issues   Identify diabetes complications and ways to prevent them  Explain how to manage diabetes during illness   Evaluate success in meeting personal goal   Establish 2-3 goals that they will plan to diligently work on until they return for the free 46-month follow-up visit  Your patient has established the following 4 month goals in their individualized success plan: I will count my carb choices at most meals and snacks and reduce my fat intake I will increase my activity level at least 3 days a week  Your patient has identified these potential barriers to change:  None stated  Your patient has identified their diabetes self-care support plan as  None stated  Support group available

## 2013-04-24 ENCOUNTER — Ambulatory Visit: Payer: BC Managed Care – PPO | Admitting: *Deleted

## 2014-01-11 ENCOUNTER — Other Ambulatory Visit: Payer: Self-pay | Admitting: Obstetrics and Gynecology

## 2014-01-14 LAB — CYTOLOGY - PAP

## 2014-01-15 ENCOUNTER — Other Ambulatory Visit: Payer: Self-pay | Admitting: Obstetrics and Gynecology

## 2014-01-15 DIAGNOSIS — R928 Other abnormal and inconclusive findings on diagnostic imaging of breast: Secondary | ICD-10-CM

## 2014-02-05 ENCOUNTER — Encounter (INDEPENDENT_AMBULATORY_CARE_PROVIDER_SITE_OTHER): Payer: Self-pay

## 2014-02-05 ENCOUNTER — Ambulatory Visit
Admission: RE | Admit: 2014-02-05 | Discharge: 2014-02-05 | Disposition: A | Discharge status: Home / Self Care | Payer: BC Managed Care – PPO | Source: Ambulatory Visit | Attending: Obstetrics and Gynecology | Admitting: Obstetrics and Gynecology

## 2014-02-05 DIAGNOSIS — R928 Other abnormal and inconclusive findings on diagnostic imaging of breast: Secondary | ICD-10-CM

## 2014-03-18 ENCOUNTER — Other Ambulatory Visit: Payer: Self-pay | Admitting: Family Medicine

## 2014-03-18 ENCOUNTER — Ambulatory Visit
Admission: RE | Admit: 2014-03-18 | Discharge: 2014-03-18 | Disposition: A | Discharge status: Home / Self Care | Payer: BC Managed Care – PPO | Source: Ambulatory Visit | Attending: Family Medicine | Admitting: Family Medicine

## 2014-03-18 DIAGNOSIS — M25561 Pain in right knee: Secondary | ICD-10-CM

## 2015-01-22 ENCOUNTER — Other Ambulatory Visit: Payer: Self-pay | Admitting: Obstetrics and Gynecology

## 2015-01-22 DIAGNOSIS — N6320 Unspecified lump in the left breast, unspecified quadrant: Secondary | ICD-10-CM

## 2015-01-28 ENCOUNTER — Other Ambulatory Visit: Payer: Self-pay | Admitting: Obstetrics and Gynecology

## 2015-01-28 ENCOUNTER — Ambulatory Visit
Admission: RE | Admit: 2015-01-28 | Discharge: 2015-01-28 | Disposition: A | Discharge status: Home / Self Care | Payer: BC Managed Care – PPO | Source: Ambulatory Visit | Attending: Obstetrics and Gynecology | Admitting: Obstetrics and Gynecology

## 2015-01-28 DIAGNOSIS — N6320 Unspecified lump in the left breast, unspecified quadrant: Secondary | ICD-10-CM

## 2016-01-28 ENCOUNTER — Other Ambulatory Visit (HOSPITAL_COMMUNITY): Payer: Self-pay | Admitting: Obstetrics and Gynecology

## 2016-01-28 DIAGNOSIS — E042 Nontoxic multinodular goiter: Secondary | ICD-10-CM

## 2016-01-28 DIAGNOSIS — E049 Nontoxic goiter, unspecified: Secondary | ICD-10-CM

## 2016-02-04 ENCOUNTER — Ambulatory Visit (HOSPITAL_COMMUNITY): Admission: RE | Admit: 2016-02-04 | Payer: BC Managed Care – PPO | Source: Ambulatory Visit

## 2017-02-08 DIAGNOSIS — D3502 Benign neoplasm of left adrenal gland: Secondary | ICD-10-CM

## 2017-02-08 HISTORY — DX: Benign neoplasm of left adrenal gland: D35.02

## 2017-04-15 ENCOUNTER — Other Ambulatory Visit: Payer: Self-pay | Admitting: Obstetrics and Gynecology

## 2017-04-15 DIAGNOSIS — E041 Nontoxic single thyroid nodule: Secondary | ICD-10-CM

## 2017-05-09 DIAGNOSIS — E042 Nontoxic multinodular goiter: Secondary | ICD-10-CM

## 2017-05-09 HISTORY — DX: Nontoxic multinodular goiter: E04.2

## 2017-05-10 ENCOUNTER — Ambulatory Visit
Admission: RE | Admit: 2017-05-10 | Discharge: 2017-05-10 | Disposition: A | Discharge status: Home / Self Care | Payer: BC Managed Care – PPO | Source: Ambulatory Visit | Attending: Obstetrics and Gynecology | Admitting: Obstetrics and Gynecology

## 2017-05-10 DIAGNOSIS — E041 Nontoxic single thyroid nodule: Secondary | ICD-10-CM

## 2017-07-27 ENCOUNTER — Other Ambulatory Visit: Payer: Self-pay | Admitting: Obstetrics and Gynecology

## 2017-07-27 DIAGNOSIS — E041 Nontoxic single thyroid nodule: Secondary | ICD-10-CM

## 2017-09-15 ENCOUNTER — Other Ambulatory Visit (HOSPITAL_COMMUNITY)
Admission: RE | Admit: 2017-09-15 | Discharge: 2017-09-15 | Disposition: A | Discharge status: Home / Self Care | Payer: BC Managed Care – PPO | Source: Ambulatory Visit | Attending: Physician Assistant | Admitting: Physician Assistant

## 2017-09-15 ENCOUNTER — Ambulatory Visit
Admission: RE | Admit: 2017-09-15 | Discharge: 2017-09-15 | Disposition: A | Discharge status: Home / Self Care | Payer: BC Managed Care – PPO | Source: Ambulatory Visit | Attending: Obstetrics and Gynecology | Admitting: Obstetrics and Gynecology

## 2017-09-15 DIAGNOSIS — E041 Nontoxic single thyroid nodule: Secondary | ICD-10-CM | POA: Insufficient documentation

## 2017-09-15 NOTE — Procedures (Signed)
  PROCEDURE SUMMARY:  Using direct ultrasound guidance, 4 passes were made using 25 g needles into the nodule within the right lobe of the thyroid.   Ultrasound was used to confirm needle placements on all occasions.   Specimens were sent to Pathology for analysis.  See procedure note under Imaging tab in Epic for full procedure details.  Judie Grieve Zanna Hawn PA-C 09/15/2017 3:46 PM

## 2017-09-25 NOTE — Progress Notes (Signed)
Cardiology Office Note   Date:  09/27/2017   ID:  Toni Peterson, DOB 1950/07/03, MRN [id number]  PCP:  Antony Contras, MD  Cardiologist:   Minus Breeding, MD   Chief Complaint  Patient presents with  . Hypertension      History of Present Illness: Toni Peterson is a 67 y.o. female who is referred by Antony Contras, MD for evaluation of difficult to control.  She was seen in 2014 by Dr. Marlou Porch.  Note that she had an echo in 2013 that was within normal EF.  She has had difficult to control hypertension recently.  She came with some leg pain to see her primary care doctor and I reviewed notes where her blood pressure was 205/112.  It subsequently came down and she did not have medication adjustment but she still running somewhat high with blood pressures at start in the 120s in the morning and are more often 623 systolic in the evening.  She takes all of her medicines at night.  The patient denies any new symptoms such as chest discomfort, neck or arm discomfort. There has been no new shortness of breath, PND or orthopnea. There have been no reported palpitations, presyncope or syncope.  She does fair amount of walking at work.       Past Medical History:  Diagnosis Date  . Diabetes ([place])   . Hyperlipidemia   . Hypertension   . Other abnormal glucose   . PONV (postoperative nausea and vomiting)    slow awakening after dental visit    Past Surgical History:  Procedure Laterality Date  . ADENOIDECTOMY    . DILATION AND CURETTAGE OF UTERUS    . DILATION AND CURETTAGE OF UTERUS  03/11/2011   Procedure: DILATATION AND CURETTAGE;  Surgeon: Darlyn Chamber, MD;  Location: [facility];  Service: Gynecology;  Laterality: N/A;  . HYSTEROSCOPY W/D&C  09-02-2006   ENDOMETRIAL BX'S  . HYSTEROSCOPY WITH RESECTOSCOPE  03/11/2011   Procedure: HYSTEROSCOPY WITH RESECTOSCOPE;  Surgeon: Darlyn Chamber, MD;  Location: [facility];  Service: Gynecology;  Laterality:  N/A;     Current Outpatient Medications  Medication Sig Dispense Refill  . amLODipine (NORVASC) 10 MG tablet Take 1 tablet by mouth every evening.     . Ascorbic Acid (VITAMIN C) 100 MG tablet Take 100 mg by mouth daily.    Marland Kitchen atenolol (TENORMIN) 50 MG tablet Take 50 mg by mouth daily.  1  . beta carotene w/minerals (OCUVITE) tablet Take 1 tablet by mouth daily.    . Cholecalciferol (VITAMIN D3) 5000 units CAPS Take 1 capsule by mouth daily.    . cyclobenzaprine (FLEXERIL) 10 MG tablet TAKE 1 TABLET BY MOUTH THREE TIMES DAILY AS NEEDED FOR 10 DAYS  0  . enalapril (VASOTEC) 20 MG tablet Take 20 mg by mouth daily.  1  . fish oil-omega-3 fatty acids 1000 MG capsule Take 1 g by mouth daily.     Marland Kitchen ibuprofen (ADVIL,MOTRIN) 200 MG tablet Take 200 mg by mouth every 6 (six) hours as needed.    Marland Kitchen spironolactone (ALDACTONE) 25 MG tablet Take 1 tablet (25 mg total) by mouth daily. 90 tablet 3   No current facility-administered medications for this visit.     Allergies:   Clarithromycin; Dilacor [diltiazem hcl]; and Norvasc [amlodipine besylate]    Social History:  The patient  reports that she has never smoked. She has never used smokeless tobacco. She reports that  she does not drink alcohol or use drugs.   Family History:  The patient's family history includes Heart attack in her mother; Hyperlipidemia in her other; Hypertension in her father, mother, and other; Stroke in her brother.    ROS:  Please see the history of present illness.   Otherwise, review of systems are positive for none.   All other systems are reviewed and negative.    PHYSICAL EXAM: VS:  BP (!) 164/83 (BP Location: Right Arm)   Pulse 80   Ht 5' 1.5" (1.562 m)   Wt 191 lb (86.6 kg)   BMI 35.50 kg/m  , BMI Body mass index is 35.5 kg/m. GENERAL:  Well appearing HEENT:  Pupils equal round and reactive, fundi not visualized, oral mucosa unremarkable NECK:  No jugular venous distention, waveform within normal limits,  carotid upstroke brisk and symmetric, no bruits, no thyromegaly LYMPHATICS:  No cervical, inguinal adenopathy LUNGS:  Clear to auscultation bilaterally BACK:  No CVA tenderness CHEST:  Unremarkable HEART:  PMI not displaced or sustained,S1 and S2 within normal limits, no S3, no S4, no clicks, no rubs,  no murmurs ABD:  Flat, positive bowel sounds normal in frequency in pitch, no bruits, no rebound, no guarding, no midline pulsatile mass, no hepatomegaly, no splenomegaly EXT:  2 plus pulses throughout, no edema, no cyanosis no clubbing SKIN:  No rashes no nodules NEURO:  Cranial nerves II through XII grossly intact, motor grossly intact throughout PSYCH:  Cognitively intact, oriented to person place and time    EKG:  EKG is not ordered today.    Recent Labs: No results found for requested labs within last 8760 hours.    Lipid Panel No results found for: CHOL, TRIG, HDL, CHOLHDL, VLDL, LDLCALC, LDLDIRECT    Wt Readings from Last 3 Encounters:  09/27/17 191 lb (86.6 kg)  12/06/12 184 lb 6.4 oz (83.6 kg)  11/10/12 183 lb (83 kg)      Other studies Reviewed: Additional studies/ records that were reviewed today include: Labs, office recrods. Review of the above records demonstrates:  Please see elsewhere in the note.     ASSESSMENT AND PLAN:  HTN:   She has familial hypertension.  I suspect probably hyper renin situation and will change her hydrochlorothiazide to spironolactone 25 mg daily.  Should get a basic metabolic profile in 1 week.  She will continue the other medicines.  I would like her to take the spironolactone in the morning and if her blood pressures are not better controlled I would like her to switch the ACE inhibitor to the morning as well.  We also talked at length about therapeutic lifestyle changes to include weight loss, salt restriction and increased physical activity.  Her labs otherwise are unremarkable.  I will check a TSH when she comes back.  OVERWEIGHT:   We discussed this at length and I gave her a goal of 10 lbs of weight loss.     Current medicines are reviewed at length with the patient today.  The patient does not have concerns regarding medicines.  The following changes have been made:  As above  Labs/ tests ordered today include:   Orders Placed This Encounter  Procedures  . Basic Metabolic Panel (BMET)  . TSH     Disposition:   FU with me or APP in one month.      Signed, Minus Breeding, MD  09/27/2017 5:16 PM    [place]

## 2017-09-27 ENCOUNTER — Encounter: Payer: Self-pay | Admitting: Cardiology

## 2017-09-27 ENCOUNTER — Ambulatory Visit: Payer: BC Managed Care – PPO | Admitting: Cardiology

## 2017-09-27 VITALS — BP 164/83 | HR 80 | Ht 61.5 in | Wt 191.0 lb

## 2017-09-27 DIAGNOSIS — I10 Essential (primary) hypertension: Secondary | ICD-10-CM

## 2017-09-27 DIAGNOSIS — Z79899 Other long term (current) drug therapy: Secondary | ICD-10-CM

## 2017-09-27 DIAGNOSIS — R5383 Other fatigue: Secondary | ICD-10-CM | POA: Diagnosis not present

## 2017-09-27 MED ORDER — SPIRONOLACTONE 25 MG PO TABS: 25.0000 mg | ORAL_TABLET | Freq: Every day | ORAL | 3 refills | Status: DC

## 2017-09-27 NOTE — Patient Instructions (Addendum)
Medication Instructions:  STOP- Hydrochlorothiazide START- Spironolactone 25 mg daily  If you need a refill on your cardiac medications before your next appointment, please call your pharmacy.  Labwork: BMP and TSH in 1 week HERE IN OUR OFFICE AT [facility]  Take the provided lab slips with you to the lab for your blood draw.   You will NOT need to fast   Testing/Procedures: None Ordered   Follow-Up: Your physician wants you to follow-up in: 1 Month.     Thank you for choosing [facility] at [facility]!!

## 2017-11-17 NOTE — Progress Notes (Signed)
Cardiology Office Note   Date:  11/18/2017   ID:  Toni Peterson, DOB June 02, 1950, MRN [id number]  PCP:  Antony Contras, MD  Cardiologist:   Minus Breeding, MD   Chief Complaint  Patient presents with  . Hypertension  . Chest Pain      History of Present Illness: Toni Peterson is a 67 y.o. female who is referred by Antony Contras, MD for evaluation of difficult to control.  She was seen in 2014 by Dr. Marlou Porch.  Note that she had an echo in 2013 that demonstrated a normal EF.  She has had difficult to control hypertension.  When I last saw her I had spironolactone.  She is done well with this.  Her blood pressures have been much better controlled.  She brings her machine with her and although she had accidentally erased the readings she reports that they are typically with systolics in the 403K.  She did have one episode of chest discomfort under her left breast about a week ago.  She thought it might of been gas but it took a long time to go away.  She is otherwise not having any chest pressure, neck or arm discomfort.  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  She does not typically bring on any symptoms with this however.  She denies any new shortness of breath, PND or orthopnea.  She is had no new palpitations, presyncope or syncope.  Past Medical History:  Diagnosis Date  . Diabetes (Toni Peterson)   . Hyperlipidemia   . Hypertension   . Other abnormal glucose   . PONV (postoperative nausea and vomiting)    slow awakening after dental visit    Past Surgical History:  Procedure Laterality Date  . ADENOIDECTOMY    . DILATION AND CURETTAGE OF UTERUS    . DILATION AND CURETTAGE OF UTERUS  03/11/2011   Procedure: DILATATION AND CURETTAGE;  Surgeon: Darlyn Chamber, MD;  Location: [facility];  Service: Gynecology;  Laterality: N/A;  . HYSTEROSCOPY W/D&C  09-02-2006   ENDOMETRIAL BX'S  . HYSTEROSCOPY WITH RESECTOSCOPE  03/11/2011   Procedure: HYSTEROSCOPY WITH  RESECTOSCOPE;  Surgeon: Darlyn Chamber, MD;  Location: [facility];  Service: Gynecology;  Laterality: N/A;     Current Outpatient Medications  Medication Sig Dispense Refill  . amLODipine (NORVASC) 10 MG tablet Take 1 tablet by mouth every evening.     . Ascorbic Acid (VITAMIN C) 100 MG tablet Take 100 mg by mouth daily.    Marland Kitchen atenolol (TENORMIN) 50 MG tablet Take 50 mg by mouth daily.  1  . beta carotene w/minerals (OCUVITE) tablet Take 1 tablet by mouth daily.    . Cholecalciferol (VITAMIN D3) 5000 units CAPS Take 1 capsule by mouth daily.    . cyclobenzaprine (FLEXERIL) 10 MG tablet TAKE 1 TABLET BY MOUTH THREE TIMES DAILY AS NEEDED FOR 10 DAYS  0  . enalapril (VASOTEC) 20 MG tablet Take 20 mg by mouth daily.  1  . fish oil-omega-3 fatty acids 1000 MG capsule Take 1 g by mouth daily.     Marland Kitchen ibuprofen (ADVIL,MOTRIN) 200 MG tablet Take 200 mg by mouth every 6 (six) hours as needed.    Marland Kitchen spironolactone (ALDACTONE) 25 MG tablet Take 1 tablet (25 mg total) by mouth daily. 90 tablet 3   No current facility-administered medications for this visit.     Allergies:   Clarithromycin; Dilacor [diltiazem hcl]; and  Norvasc [amlodipine besylate]    ROS:  Please see the history of present illness.   Otherwise, review of systems are positive for none.   All other systems are reviewed and negative.    PHYSICAL EXAM: VS:  BP 124/72   Pulse (!) 55   Ht 5' 1.5" (1.562 m)   Wt 188 lb (85.3 kg)   SpO2 98%   BMI 34.95 kg/m  , BMI Body mass index is 34.95 kg/m. GENERAL:  Well appearing NECK:  No jugular venous distention, waveform within normal limits, carotid upstroke brisk and symmetric, no bruits, no thyromegaly LUNGS:  Clear to auscultation bilaterally CHEST:  Unremarkable HEART:  PMI not displaced or sustained,S1 and S2 within normal limits, no S3, no S4, no clicks, no rubs, 2 out of 6 systolic murmur heard best at the right upper sternal border and no change with Valsalva, no  diastolic murmurs ABD:  Flat, positive bowel sounds normal in frequency in pitch, no bruits, no rebound, no guarding, no midline pulsatile mass, no hepatomegaly, no splenomegaly EXT:  2 plus pulses throughout, no edema, no cyanosis no clubbing   EKG:  EKG is ordered today. Sinus bradycardia, rate 55, axis within normal limits, intervals within normal limits, no acute ST-T wave changes.   Recent Labs: No results found for requested labs within last 8760 hours.    Lipid Panel No results found for: CHOL, TRIG, HDL, CHOLHDL, VLDL, LDLCALC, LDLDIRECT    Wt Readings from Last 3 Encounters:  11/18/17 188 lb (85.3 kg)  09/27/17 191 lb (86.6 kg)  12/06/12 184 lb 6.4 oz (83.6 kg)      Other studies Reviewed: Additional studies/ records that were reviewed today include:  Labs Review of the above records demonstrates:      ASSESSMENT AND PLAN:  HTN:   Her blood pressure is much better controlled.  No change in therapy.  MURMUR: In 2013 and echo demonstrated some septal hypertrophy.  I will repeat an echocardiogram.  There was no dynamic component to this murmur.  I will be looking at the echo and also doing the POET (Plain Old Exercise Treadmill) which will allow me to risk stratify septal hypertrophy as well.  OVERWEIGHT: She lost a little weight and I encouraged more of the same.  CHEST PAIN: This was somewhat atypical.  I like to bring her back for a POET (Plain Old Exercise Treadmill).  I am going to do this on beta-blocker realizing that it might be difficult to get her heart rate elevated to target.     Current medicines are reviewed at length with the patient today.  The patient does not have concerns regarding medicines.  The following changes have been made: None  Labs/ tests ordered today include:  None  Orders Placed This Encounter  Procedures  . EXERCISE TOLERANCE TEST (ETT)  . EKG 12-Lead  . ECHOCARDIOGRAM COMPLETE     Disposition:   FU with me      Signed, Minus Breeding, MD  11/18/2017 5:26 PM    [place]

## 2017-11-18 ENCOUNTER — Ambulatory Visit: Payer: BC Managed Care – PPO | Admitting: Cardiology

## 2017-11-18 ENCOUNTER — Encounter: Payer: Self-pay | Admitting: Cardiology

## 2017-11-18 VITALS — BP 124/72 | HR 55 | Ht 61.5 in | Wt 188.0 lb

## 2017-11-18 DIAGNOSIS — I10 Essential (primary) hypertension: Secondary | ICD-10-CM | POA: Diagnosis not present

## 2017-11-18 DIAGNOSIS — R079 Chest pain, unspecified: Secondary | ICD-10-CM | POA: Diagnosis not present

## 2017-11-18 DIAGNOSIS — I517 Cardiomegaly: Secondary | ICD-10-CM | POA: Diagnosis not present

## 2017-11-18 NOTE — Patient Instructions (Signed)
Medication Instructions:  Continue current medications  If you need a refill on your cardiac medications before your next appointment, please call your pharmacy.  Labwork: None Ordered   If you have labs (blood work) drawn today and your tests are completely normal, you will receive your results only by: Marland Kitchen MyChart Message (if you have MyChart) OR . A paper copy in the mail If you have any lab test that is abnormal or we need to change your treatment, we will call you to review the results.  Testing/Procedures: Your physician has requested that you have an echocardiogram. Echocardiography is a painless test that uses sound waves to create images of your heart. It provides your doctor with information about the size and shape of your heart and how well your heart's chambers and valves are working. This procedure takes approximately one hour. There are no restrictions for this procedure.  Your physician has requested that you have an exercise tolerance test. For further information please visit [email or web address]. Please also follow instruction sheet, as given.    Follow-Up: You will need a follow up appointment in 1 Year.  Please call our office 2 months in advance([phone]) to schedule the appointment.  You may see  DR Percival Spanish or one of the following Advanced Practice Providers on your designated Care Team:   . Rosaria Ferries, PA-C . Jory Sims, DNP, ANP  At [facility], you and your health needs are our priority.  As part of our continuing mission to provide you with exceptional heart care, we have created designated Provider Care Teams.  These Care Teams include your primary Cardiologist (physician) and Advanced Practice Providers (APPs -  Physician Assistants and Nurse Practitioners) who all work together to provide you with the care you need, when you need it.   Thank you for choosing [facility] at [facility]!!

## 2017-11-30 ENCOUNTER — Telehealth: Payer: Self-pay | Admitting: *Deleted

## 2017-11-30 NOTE — Telephone Encounter (Signed)
Patient called back and was giving the appt for tomorrow along with instructions for parking and exam

## 2017-11-30 NOTE — Telephone Encounter (Signed)
Called and left the patient a message to call the office back. Need to schedule the patient for a new patient appt  

## 2017-12-01 ENCOUNTER — Encounter: Payer: Self-pay | Admitting: Oncology

## 2017-12-01 ENCOUNTER — Encounter: Payer: Self-pay | Admitting: Radiation Oncology

## 2017-12-01 ENCOUNTER — Inpatient Hospital Stay: Payer: BC Managed Care – PPO | Attending: Gynecologic Oncology | Admitting: Gynecologic Oncology

## 2017-12-01 ENCOUNTER — Encounter: Payer: Self-pay | Admitting: Gynecologic Oncology

## 2017-12-01 ENCOUNTER — Telehealth: Payer: Self-pay | Admitting: Oncology

## 2017-12-01 VITALS — BP 138/83 | HR 57 | Temp 98.4°F | Resp 16 | Ht 61.5 in | Wt 184.3 lb

## 2017-12-01 DIAGNOSIS — C530 Malignant neoplasm of endocervix: Secondary | ICD-10-CM

## 2017-12-01 DIAGNOSIS — C539 Malignant neoplasm of cervix uteri, unspecified: Secondary | ICD-10-CM | POA: Diagnosis not present

## 2017-12-01 DIAGNOSIS — Z87891 Personal history of nicotine dependence: Secondary | ICD-10-CM | POA: Insufficient documentation

## 2017-12-01 DIAGNOSIS — I10 Essential (primary) hypertension: Secondary | ICD-10-CM | POA: Insufficient documentation

## 2017-12-01 DIAGNOSIS — Z79899 Other long term (current) drug therapy: Secondary | ICD-10-CM | POA: Insufficient documentation

## 2017-12-01 NOTE — Patient Instructions (Signed)
Plan to have a PET scan. Nothing to eat or drink six hours before your PET scan. We will arrange for you to meet with Dr. Gery Pray with Radiation Oncology to discuss radiation.  We will also arrange for you to meet with a medical oncologist to discuss chemotherapy.  Please call our office at [phone] for any questions or concerns.

## 2017-12-01 NOTE — Telephone Encounter (Signed)
Toni Peterson and scheduled appointment to see Dr. Alvy Bimler tomorrow, 12/01/17 at 11:30 am with arrival at 11:15 am.  She verbalized understanding and agreement.

## 2017-12-01 NOTE — Progress Notes (Signed)
Consult Note: Gyn-Onc  Consult was requested by Dr. Radene Knee for the evaluation of Toni Peterson 67 y.o. female  CC:  Chief Complaint  Patient presents with  . Cervical Cancer    Assessment/Plan:  Toni Peterson  is a 67 y.o.  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  Given the size of the lesion >4cm, I do not think she is a good candidate for primary surgery with radical hysterectomy as radiation would be necessary postop which is associated with higher morbidity. However, she may be a candidate for chemoradiation and abbreviated brachytherapy dosing with a plan for extrafascial hysterectomy 6 weeks post radiation. This plan would be contingent upon demonstrating good response to therapy and pretreatment staging imaging showing no metastatic disease.  We will order a PET/CT for staging purposes.  I have facilitated referrals to medical oncology for weekly cddp and radiation oncology to discuss external beam and brachytherapy.   HPI: Toni Peterson is a 67 year old P1 who is seen in consultation at the request of Dr Radene Knee for endocervical vs endometrial carcinoma.  Patient has a history of symptomatic endometrial fibroids in 2013 for which she underwent hysteroscopic resection with benign pathology.  She has had lifelong normal Pap smears including in 2019.  Of note HPV testing was not performed in this Pap in 2019.  She developed symptoms of very light vaginal spotting in August 2019 and was seen by her gynecologist, Dr. Radene Knee, who performed a pelvic examination.  On physical examination and endocervical mass was appreciated and felt to be likely to be a polyp or fibroid was biopsied on 11/22/17.  This revealed poorly differentiated invasive carcinoma.  Immunostains revealed that it was CK7 positive, P 16+, p53 positive, PAX 8+ and CK 5 6 weakly positive.  CK 20, ER PR and p53 immunostains were negative.  The morphology and  Immuno profile favored a gynecologic primary with endocervical and endometrial adenocarcinoma in the differential diagnosis.  An ultrasound scan on November 22, 2017 revealed several intramural fibroids.  With saline infusion there was a 1.8 cm density within the endometrial cavity which could be a fibroid or polyp.  She has medical history significant for atypical chest pain and is scheduled for stress test 12/26/17 with Dr Percival Spanish. She has type II DM which is diet controlled.  Current Meds:  Outpatient Encounter Medications as of 12/01/2017  Medication Sig  . amLODipine (NORVASC) 10 MG tablet Take 1 tablet by mouth every evening.   . Ascorbic Acid (VITAMIN C) 100 MG tablet Take 100 mg by mouth daily.  Marland Kitchen atenolol (TENORMIN) 50 MG tablet Take 50 mg by mouth daily.  . beta carotene w/minerals (OCUVITE) tablet Take 1 tablet by mouth daily.  . Cholecalciferol (VITAMIN D3) 5000 units CAPS Take 1 capsule by mouth daily.  . cyclobenzaprine (FLEXERIL) 10 MG tablet TAKE 1 TABLET BY MOUTH THREE TIMES DAILY AS NEEDED FOR 10 DAYS  . enalapril (VASOTEC) 20 MG tablet Take 20 mg by mouth daily.  . fish oil-omega-3 fatty acids 1000 MG capsule Take 1 g by mouth daily.   Marland Kitchen ibuprofen (ADVIL,MOTRIN) 200 MG tablet Take 200 mg by mouth every 6 (six) hours as needed.  Marland Kitchen spironolactone (ALDACTONE) 25 MG tablet Take 1 tablet (25 mg total) by mouth daily.   No facility-administered encounter medications on file as of 12/01/2017.     Allergy:  Allergies  Allergen Reactions  . Clarithromycin   . Dilacor [Diltiazem  Hcl]   . Norvasc [Amlodipine Besylate]     Social Hx:   Social History   Socioeconomic History  . Marital status: Single    Spouse name: Not on file  . Number of children: Not on file  . Years of education: Not on file  . Highest education level: Not on file  Occupational History  . Not on file  Social Needs  . Financial resource strain: Not on file  . Food insecurity:    Worry: Not on  file    Inability: Not on file  . Transportation needs:    Medical: Not on file    Non-medical: Not on file  Tobacco Use  . Smoking status: Never Smoker  . Smokeless tobacco: Never Used  Substance and Sexual Activity  . Alcohol use: No  . Drug use: No  . Sexual activity: Not on file  Lifestyle  . Physical activity:    Days per week: Not on file    Minutes per session: Not on file  . Stress: Not on file  Relationships  . Social connections:    Talks on phone: Not on file    Gets together: Not on file    Attends religious service: Not on file    Active member of club or organization: Not on file    Attends meetings of clubs or organizations: Not on file    Relationship status: Not on file  . Intimate partner violence:    Fear of current or ex partner: Not on file    Emotionally abused: Not on file    Physically abused: Not on file    Forced sexual activity: Not on file  Other Topics Concern  . Not on file  Social History Narrative   Former smoker, Quit in year quit 2003. Tobacco history last updated 11/052014.no smoking.no Alcohol , no recreational  Drugs   Nothing structured.Occupation : Building services engineer Status Single   Children 1   Religion ; [employer]   sdeat belt use  yes          Past Surgical Hx:  Past Surgical History:  Procedure Laterality Date  . ADENOIDECTOMY    . DILATION AND CURETTAGE OF UTERUS    . DILATION AND CURETTAGE OF UTERUS  03/11/2011   Procedure: DILATATION AND CURETTAGE;  Surgeon: Darlyn Chamber, MD;  Location: [facility];  Service: Gynecology;  Laterality: N/A;  . HYSTEROSCOPY W/D&C  09-02-2006   ENDOMETRIAL BX'S  . HYSTEROSCOPY WITH RESECTOSCOPE  03/11/2011   Procedure: HYSTEROSCOPY WITH RESECTOSCOPE;  Surgeon: Darlyn Chamber, MD;  Location: [facility];  Service: Gynecology;  Laterality: N/A;    Past Medical Hx:  Past Medical History:  Diagnosis Date  . Diabetes ([place])   . Hyperlipidemia   .  Hypertension   . Other abnormal glucose   . PONV (postoperative nausea and vomiting)    slow awakening after dental visit    Past Gynecological History:  SVD x 1, no hx of abnormal paps, last pap in 2019 No LMP recorded. (Menstrual status: Perimenopausal).  Family Hx:  Family History  Problem Relation Age of Onset  . Stroke Brother        Died 44  . Heart attack Mother        Vague  . Hypertension Mother   . Hyperlipidemia Other   . Hypertension Other   . Hypertension Father     Review of Systems:  Constitutional  Feels well,  ENT Normal appearing ears and nares bilaterally Skin/Breast  No rash, sores, jaundice, itching, dryness Cardiovascular  No chest pain, shortness of breath, or edema  Pulmonary  No cough or wheeze.  Gastro Intestinal  No nausea, vomitting, or diarrhoea. No bright red blood per rectum, no abdominal pain, change in bowel movement, or constipation.  Genito Urinary  No frequency, urgency, dysuria, + postmenopausal bleeding Musculo Skeletal  No myalgia, arthralgia, joint swelling or pain  Neurologic  No weakness, numbness, change in gait,  Psychology  No depression, anxiety, insomnia.   Vitals:  Blood pressure 138/83, pulse (!) 57, temperature 98.4 F (36.9 C), temperature source Oral, resp. rate 16, height 5' 1.5" (1.562 m), weight 184 lb 5 oz (83.6 kg), SpO2 100 %.  Physical Exam: WD in NAD Neck  Supple NROM, without any enlargements.  Lymph Node Survey No cervical supraclavicular or inguinal adenopathy Cardiovascular  Pulse normal rate, regularity and rhythm. S1 and S2 normal.  Lungs  Clear to auscultation bilateraly, without wheezes/crackles/rhonchi. Good air movement.  Skin  No rash/lesions/breakdown  Psychiatry  Alert and oriented to person, place, and time  Abdomen  Normoactive bowel sounds, abdomen soft, non-tender and obese without evidence of hernia.  Back No CVA tenderness Genito Urinary  Vulva/vagina: Normal external  female genitalia.  No lesions. No discharge or bleeding.  Bladder/urethra:  No lesions or masses, well supported bladder  Vagina: normal  Cervix: There is a firm, immobile endocervical mass 4+cm in diameter which extends into the endocervical os and replaces the anterior lip of the cervix. There is no apparent vaginal involvement.  Uterus: Bulky,, mobile, no parametrial involvement or nodularity.  Adnexa: no palpable parametrial disease (though the mass protrudes/barrels towards the right) or adnexal masses. Rectal  Good tone, no masses no cul de sac nodularity. No parametrial disease palpable Extremities  No bilateral cyanosis, clubbing or edema.   Thereasa Solo, MD  12/01/2017, 11:35 AM

## 2017-12-02 ENCOUNTER — Encounter: Payer: Self-pay | Admitting: Oncology

## 2017-12-02 ENCOUNTER — Telehealth: Payer: Self-pay | Admitting: Hematology and Oncology

## 2017-12-02 ENCOUNTER — Encounter: Payer: Self-pay | Admitting: Hematology and Oncology

## 2017-12-02 ENCOUNTER — Inpatient Hospital Stay (HOSPITAL_BASED_OUTPATIENT_CLINIC_OR_DEPARTMENT_OTHER): Payer: BC Managed Care – PPO | Admitting: Hematology and Oncology

## 2017-12-02 DIAGNOSIS — Z79899 Other long term (current) drug therapy: Secondary | ICD-10-CM

## 2017-12-02 DIAGNOSIS — Z87891 Personal history of nicotine dependence: Secondary | ICD-10-CM | POA: Diagnosis not present

## 2017-12-02 DIAGNOSIS — I10 Essential (primary) hypertension: Secondary | ICD-10-CM | POA: Diagnosis not present

## 2017-12-02 DIAGNOSIS — C539 Malignant neoplasm of cervix uteri, unspecified: Secondary | ICD-10-CM | POA: Diagnosis present

## 2017-12-02 DIAGNOSIS — C530 Malignant neoplasm of endocervix: Secondary | ICD-10-CM

## 2017-12-02 NOTE — Assessment & Plan Note (Addendum)
Based on the assessment of GYN oncologist, she could either have locally advanced cervical cancer versus uterine cancer PET CT scan will hopefully help delineate the type of disease  The patient is stunned with the diagnosis She has received regular care and Pap smear without any abnormalities in the past and has difficulties accepting the diagnosis I have asked the GYN oncologist navigator to review test results of PET CT scan with her next week She has appointment to see radiation oncologist on December 12, 2017 If PET CT scan confirmed diagnosis of cervical cancer, then I would recommend concurrent chemo radiation therapy with weekly cisplatin.  In this case, I would recommend the addition of PDL 1 testing on her tissue However, if she has disease suggestive of uterine cancer, I would recommend combination chemotherapy with carboplatin and Taxol. In this case, I would recommend ER, PR and MSI testing on her tissue sample If she has diffuse metastatic cancer of either type, then I would recommend combination chemotherapy with carboplatin and Taxol with or without bevacizumab We discussed port placement.  The patient is overwhelmed and undecided I recommend chemo education class on December 12, 2017.  We discussed briefly some of the expected risk, benefits, side effects of therapy.

## 2017-12-02 NOTE — Progress Notes (Signed)
[facility] CONSULT NOTE  Patient Care Team: Antony Contras, MD as PCP - General (Family Medicine) Minus Breeding, MD as PCP - Cardiology (Cardiology)  ASSESSMENT & PLAN:  Cervical cancer [facility]) Based on the assessment of GYN oncologist, she could either have locally advanced cervical cancer versus uterine cancer PET CT scan will hopefully help delineate the type of disease  The patient is stunned with the diagnosis She has received regular care and Pap smear without any abnormalities in the past and has difficulties accepting the diagnosis I have asked the GYN oncologist navigator to review test results of PET CT scan with her next week She has appointment to see radiation oncologist on December 12, 2017 If PET CT scan confirmed diagnosis of cervical cancer, then I would recommend concurrent chemo radiation therapy with weekly cisplatin.  In this case, I would recommend the addition of PDL 1 testing on her tissue However, if she has disease suggestive of uterine cancer, I would recommend combination chemotherapy with carboplatin and Taxol. In this case, I would recommend ER, PR and MSI testing on her tissue sample If she has diffuse metastatic cancer of either type, then I would recommend combination chemotherapy with carboplatin and Taxol with or without bevacizumab We discussed port placement.  The patient is overwhelmed and undecided I recommend chemo education class on December 12, 2017.  We discussed briefly some of the expected risk, benefits, side effects of therapy.     No orders of the defined types were placed in this encounter.    CHIEF COMPLAINTS/PURPOSE OF CONSULTATION:  Locally advanced cancer involving the cervix, either cervical cancer versus uterine cancer  HISTORY OF PRESENTING ILLNESS:  Toni Peterson 67 y.o. female is here because of recent diagnosis of GYN malignancy. She was referred here due to intermittent vaginal spotting since August of this  year.  She also have discomfort in her pelvis which she described like menstrual cramps. I have the opportunity to reviewed the outside materials I have reviewed her chart and materials related to her cancer extensively and collaborated history with the patient. Summary of oncologic history is as follows:   Cervical cancer ([place])   09/08/2017 Initial Diagnosis    Patient has a history of symptomatic endometrial fibroids in 2013 for which she underwent hysteroscopic resection with benign pathology.  She has had lifelong normal Pap smears including in 2019.  Of note HPV testing was not performed in this Pap in 2019.  She developed symptoms of very light vaginal spotting in August 2019     11/22/2017 Procedure    She was seen by her gynecologist, Dr. Radene Knee, who performed a pelvic examination.  On physical examination and endocervical mass was appreciated and felt to be likely to be a polyp or fibroid was biopsied on 11/22/17.       11/22/2017 Pathology Results    This revealed poorly differentiated invasive carcinoma.  Immunostains revealed that it was CK7 positive, P 16+, p53 positive, PAX 8+ and CK 5 6 weakly positive.  CK 20, ER PR and p53 immunostains were negative.  The morphology and Immuno profile favored a gynecologic primary with endocervical and endometrial adenocarcinoma in the differential diagnosis.     11/22/2017 Imaging    An ultrasound scan on November 22, 2017 revealed several intramural fibroids.  With saline infusion there was a 1.8 cm density within the endometrial cavity which could be a fibroid or polyp.     The patient is stunned with a diagnosis.  Her appetite remains stable and she denies recent weight loss.  No recent changes in bowel habits.  No urinary retention hematuria or hematochezia   MEDICAL HISTORY:  Past Medical History:  Diagnosis Date  . Diabetes ([place])   . Hyperlipidemia   . Hypertension   . Other abnormal glucose   . PONV (postoperative nausea and  vomiting)    slow awakening after dental visit    SURGICAL HISTORY: Past Surgical History:  Procedure Laterality Date  . ADENOIDECTOMY    . DILATION AND CURETTAGE OF UTERUS    . DILATION AND CURETTAGE OF UTERUS  03/11/2011   Procedure: DILATATION AND CURETTAGE;  Surgeon: Darlyn Chamber, MD;  Location: [facility];  Service: Gynecology;  Laterality: N/A;  . HYSTEROSCOPY W/D&C  09-02-2006   ENDOMETRIAL BX'S  . HYSTEROSCOPY WITH RESECTOSCOPE  03/11/2011   Procedure: HYSTEROSCOPY WITH RESECTOSCOPE;  Surgeon: Darlyn Chamber, MD;  Location: [facility];  Service: Gynecology;  Laterality: N/A;    SOCIAL HISTORY: Social History   Socioeconomic History  . Marital status: Single    Spouse name: Not on file  . Number of children: 1  . Years of education: Not on file  . Highest education level: Not on file  Occupational History  . Not on file  Social Needs  . Financial resource strain: Not on file  . Food insecurity:    Worry: Not on file    Inability: Not on file  . Transportation needs:    Medical: Not on file    Non-medical: Not on file  Tobacco Use  . Smoking status: Never Smoker  . Smokeless tobacco: Never Used  Substance and Sexual Activity  . Alcohol use: No  . Drug use: No  . Sexual activity: Not on file  Lifestyle  . Physical activity:    Days per week: Not on file    Minutes per session: Not on file  . Stress: Not on file  Relationships  . Social connections:    Talks on phone: Not on file    Gets together: Not on file    Attends religious service: Not on file    Active member of club or organization: Not on file    Attends meetings of clubs or organizations: Not on file    Relationship status: Not on file  . Intimate partner violence:    Fear of current or ex partner: Not on file    Emotionally abused: Not on file    Physically abused: Not on file    Forced sexual activity: Not on file  Other Topics Concern  . Not on file  Social  History Narrative   Former smoker, Quit in year quit 2003. 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 Stroke Brother        Died 44  . Heart attack Mother        Vague  . Hypertension Mother   . Hyperlipidemia Other   . Hypertension Other   . Hypertension Father   . Cancer Paternal Aunt        breast ca  . Cancer Cousin        breast ca  ALLERGIES:  is allergic to clarithromycin; dilacor [diltiazem hcl]; and norvasc [amlodipine besylate].  MEDICATIONS:  Current Outpatient Medications  Medication Sig Dispense Refill  . amLODipine (NORVASC) 10 MG tablet Take 1 tablet by mouth every evening.     . Ascorbic Acid (VITAMIN C) 100 MG tablet Take 100 mg by mouth daily.    Marland Kitchen atenolol (TENORMIN) 50 MG tablet Take 50 mg by mouth daily.  1  . beta carotene w/minerals (OCUVITE) tablet Take 1 tablet by mouth daily.    . Cholecalciferol (VITAMIN D3) 5000 units CAPS Take 1 capsule by mouth daily.    . cyclobenzaprine (FLEXERIL) 10 MG tablet TAKE 1 TABLET BY MOUTH THREE TIMES DAILY AS NEEDED FOR 10 DAYS  0  . enalapril (VASOTEC) 20 MG tablet Take 20 mg by mouth daily.  1  . fish oil-omega-3 fatty acids 1000 MG capsule Take 1 g by mouth daily.     Marland Kitchen ibuprofen (ADVIL,MOTRIN) 200 MG tablet Take 200 mg by mouth every 6 (six) hours as needed.    Marland Kitchen spironolactone (ALDACTONE) 25 MG tablet Take 1 tablet (25 mg total) by mouth daily. 90 tablet 3   No current facility-administered medications for this visit.     REVIEW OF SYSTEMS:   Constitutional: Denies fevers, chills or abnormal night sweats Eyes: Denies blurriness of vision, double vision or watery eyes Ears, nose, mouth, throat, and face: Denies mucositis or sore throat Respiratory:  Denies cough, dyspnea or wheezes Cardiovascular: Denies palpitation, chest discomfort or lower extremity swelling Gastrointestinal:  Denies nausea, heartburn or change in bowel habits Skin: Denies abnormal skin rashes Lymphatics: Denies new lymphadenopathy or easy bruising Neurological:Denies numbness, tingling or new weaknesses Behavioral/Psych: Mood is stable, no new changes  All other systems were reviewed with the patient and are negative.  PHYSICAL EXAMINATION: ECOG PERFORMANCE STATUS: 1 - Symptomatic but completely ambulatory  Vitals:   12/02/17 1207  BP: (!) 144/59  Pulse: (!) 55  Resp: 18  Temp: 98.3 F (36.8 C)  SpO2: 100%   Filed Weights   12/02/17 1207  Weight: 185 lb 9.6 oz (84.2 kg)    GENERAL:alert, no distress and comfortable SKIN: skin color, texture, turgor are normal, no rashes or significant lesions EYES: normal, conjunctiva are pink and non-injected, sclera clear OROPHARYNX:no exudate, no erythema and lips, buccal mucosa, and tongue normal  NECK: supple, thyroid normal size, non-tender, without nodularity LYMPH:  no palpable lymphadenopathy in the cervical, axillary or inguinal LUNGS: clear to auscultation and percussion with normal breathing effort HEART: regular rate & rhythm and no murmurs and no lower extremity edema ABDOMEN:abdomen soft, non-tender and normal bowel sounds Musculoskeletal:no cyanosis of digits and no clubbing  PSYCH: alert & oriented x 3 with fluent speech NEURO: no focal motor/sensory deficits  LABORATORY DATA:  I have reviewed the data as listed Lab Results  Component Value Date   WBC 4.8 03/11/2011   HGB 11.9 (L) 03/11/2011   HCT 35.8 (L) 03/11/2011   MCV 92.5 03/11/2011   PLT 314 03/11/2011   I spent 50 minutes counseling the patient face to face. The total time spent in the appointment was 60 minutes and more than 50% was on counseling.  All questions were answered. The patient knows to call the clinic with any problems,  questions or concerns.  Heath Lark, MD 12/02/2017 5:00 PM

## 2017-12-02 NOTE — Telephone Encounter (Signed)
Gave pt avs and calendar  °

## 2017-12-05 ENCOUNTER — Telehealth: Payer: Self-pay | Admitting: Oncology

## 2017-12-05 NOTE — Telephone Encounter (Addendum)
Toni Peterson called back and said she needs to have an afternoon appointment due to her work.  Rescheduled the appointment to 1 pm.  Notified Melissa in scheduling.

## 2017-12-05 NOTE — Telephone Encounter (Signed)
Left a message for patient regarding appointment with Dr. Talbert Cage on 12/14/17 at 10 am.  Requested a return call.

## 2017-12-07 NOTE — Progress Notes (Signed)
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  Toni Peterson presented with symptoms of: She developed symptoms of very light vaginal spotting in August 2019 and was seen by her gynecologist, Dr. Radene Knee, who performed a pelvic examination.  On physical examination and endocervical mass was appreciated and felt to be likely to be a polyp or fibroid was biopsied on 11/22/17.  This revealed poorly differentiated invasive carcinoma.  Immunostains revealed that it was CK7 positive, P 16+, p53 positive, PAX 8+ and CK 5 6 weakly positive.  CK 20, ER PR and p53 immunostains were negative.  The morphology and Immuno profile favored a gynecologic primary with endocervical and endometrial adenocarcinoma in the differential diagnosis.  Biopsies revealed:   Past/Anticipated interventions by Gyn/Onc surgery, if any: Per Dr. Denman George 12/01/17:  Given the size of the lesion >4cm, I do not think she is a good candidate for primary surgery with radical hysterectomy as radiation would be necessary postop which is associated with higher morbidity. However, she may be a candidate for chemoradiation and abbreviated brachytherapy dosing with a plan for extrafascial hysterectomy 6 weeks post radiation. This plan would be contingent upon demonstrating good response to therapy and pretreatment staging imaging showing no metastatic disease.  We will order a PET/CT for staging purposes.  I have facilitated referrals to medical oncology for weekly cddp and radiation oncology to discuss external beam and brachytherapy.  Past/Anticipated interventions by medical oncology, if any: Per Dr. Alvy Bimler 12/02/17: If PET CT scan confirmed diagnosis of cervical cancer, then I would recommend concurrent chemo radiation therapy with weekly cisplatin.  In this case, I would recommend the addition of PDL 1 testing on her tissue However, if she has disease suggestive of uterine  cancer, I would recommend combination chemotherapy with carboplatin and Taxol. In this case, I would recommend ER, PR and MSI testing on her tissue sample If she has diffuse metastatic cancer of either type, then I would recommend combination chemotherapy with carboplatin and Taxol with or without bevacizumab We discussed port placement.  The patient is overwhelmed and undecided I recommend chemo education class on December 12, 2017.  We discussed briefly some of the expected risk, benefits, side effects of therapy.  Weight changes, if ZJQ:BHAL  Bowel/Bladder complaints, if any: Patient states that she has constipation sometime . Patient states that she takes medication for relief. Patient denies any hematuria or dysuria. Patient denies any vaginal discharge . Patient denies any rectal bleeding.  Nausea/Vomiting, if PFX:TKWI   Pain issues, if any:  Patient denies any vaginal pain. States that she has some pain when she gets constipated.  Pacemaker/ICD? No  Possible current pregnancy? No  Is the patient on methotrexate? No  Current Complaints / other details:  None Vitals:   12/12/17 0826  BP: (!) 159/84  Pulse: 61  Resp: 20  Temp: 98.2 F (36.8 C)  TempSrc: Oral  SpO2: 100%  Weight: 185 lb 12.8 oz (84.3 kg)  Height: 5' 1.5" (1.562 m)   Wt Readings from Last 3 Encounters:  12/12/17 185 lb 12.8 oz (84.3 kg)  12/02/17 185 lb 9.6 oz (84.2 kg)  12/01/17 184 lb 5 oz (83.6 kg)

## 2017-12-08 ENCOUNTER — Ambulatory Visit (HOSPITAL_COMMUNITY)
Admission: RE | Admit: 2017-12-08 | Discharge: 2017-12-08 | Disposition: A | Discharge status: Home / Self Care | Payer: BC Managed Care – PPO | Source: Ambulatory Visit | Attending: Gynecologic Oncology | Admitting: Gynecologic Oncology

## 2017-12-08 ENCOUNTER — Telehealth: Payer: Self-pay | Admitting: Oncology

## 2017-12-08 ENCOUNTER — Ambulatory Visit: Payer: BC Managed Care – PPO | Admitting: Radiation Oncology

## 2017-12-08 DIAGNOSIS — C530 Malignant neoplasm of endocervix: Secondary | ICD-10-CM | POA: Insufficient documentation

## 2017-12-08 LAB — GLUCOSE, CAPILLARY: Glucose-Capillary: 96 mg/dL (ref 70–99)

## 2017-12-08 MED ORDER — FLUDEOXYGLUCOSE F - 18 (FDG) INJECTION
9.5000 | Freq: Once | INTRAVENOUS | Status: AC | PRN
  Administered 2017-12-08: 9.5 via INTRAVENOUS

## 2017-12-08 NOTE — Telephone Encounter (Signed)
Patient called and said she ate oatmeal at about 8 am this morning and wants to make sure this is OK.  She also drank water about 30 minutes ago.  Called Nuclear Medicine and they said the oatmeal was fine and will not interfere with the scan.  They also said the water is fine but to advised her not to eat or drink anything else.  Called patient back and advised her of message from Nuclear Medicine.  She verbalized understanding and agreement.

## 2017-12-12 ENCOUNTER — Encounter: Payer: Self-pay | Admitting: Radiation Oncology

## 2017-12-12 ENCOUNTER — Telehealth: Payer: Self-pay | Admitting: Oncology

## 2017-12-12 ENCOUNTER — Inpatient Hospital Stay: Payer: BC Managed Care – PPO | Attending: Hematology and Oncology

## 2017-12-12 ENCOUNTER — Other Ambulatory Visit: Payer: Self-pay | Admitting: Hematology

## 2017-12-12 ENCOUNTER — Ambulatory Visit
Admission: RE | Admit: 2017-12-12 | Discharge: 2017-12-12 | Disposition: A | Discharge status: Home / Self Care | Payer: BC Managed Care – PPO | Source: Ambulatory Visit | Attending: Radiation Oncology | Admitting: Radiation Oncology

## 2017-12-12 ENCOUNTER — Other Ambulatory Visit: Payer: Self-pay | Admitting: Gynecologic Oncology

## 2017-12-12 ENCOUNTER — Encounter: Payer: Self-pay | Admitting: Oncology

## 2017-12-12 ENCOUNTER — Other Ambulatory Visit: Payer: Self-pay

## 2017-12-12 VITALS — BP 159/84 | HR 61 | Temp 98.2°F | Resp 20 | Ht 61.5 in | Wt 185.8 lb

## 2017-12-12 DIAGNOSIS — Z79899 Other long term (current) drug therapy: Secondary | ICD-10-CM | POA: Diagnosis not present

## 2017-12-12 DIAGNOSIS — G959 Disease of spinal cord, unspecified: Secondary | ICD-10-CM

## 2017-12-12 DIAGNOSIS — C539 Malignant neoplasm of cervix uteri, unspecified: Secondary | ICD-10-CM | POA: Diagnosis not present

## 2017-12-12 DIAGNOSIS — R933 Abnormal findings on diagnostic imaging of other parts of digestive tract: Secondary | ICD-10-CM | POA: Insufficient documentation

## 2017-12-12 DIAGNOSIS — C530 Malignant neoplasm of endocervix: Secondary | ICD-10-CM

## 2017-12-12 DIAGNOSIS — C7951 Secondary malignant neoplasm of bone: Secondary | ICD-10-CM | POA: Insufficient documentation

## 2017-12-12 NOTE — Telephone Encounter (Signed)
Called [facility] and advised her that a biopsy of the L5 lesion has been ordered and that we will give her a call with the appointment once it is scheduled.  Also advised her that the plan is for her to see Dr. Sondra Come 2 days after the biopsy to discuss the results and plan for treatment.  If the lesion is not cancer, she will proceed with external beam radiation for 25 treatments along with weekly chemotherapy followed by 1 implant.  Also let her know surgery will be planned for 6 weeks after radiation is finished.  Discussed per Dr. Sondra Come that there was an abnormal area in her colon on the PET scan and that a colonoscopy would be scheduled after chemoradiation during the 6 weeks before surgery.  She verbalized understanding and agreement.

## 2017-12-12 NOTE — Progress Notes (Signed)
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 female who is presenting to the office today for evaluation of  IB3 endocervical vs stage IV  poorly differentiated adenocarcinoma. She is accompanied by her cousin.    She developed symptoms of very light vaginal spotting in August 2019 and was seen by her gynecologist, Dr. Radene Knee, who performed a pelvic examination.  On physical examination and endocervical mass was appreciated and felt to be likely to be a polyp or fibroid was biopsied on 11/22/17.  This revealed poorly differentiated invasive carcinoma.  Immunostains revealed that it was CK7 positive, P 16+, p53 positive, PAX 8+ and CK 5 6 weakly positive.  CK 20, ER PR and p53 immunostains were negative.  The morphology and Immuno profile favored a gynecologic primary with endocervical and endometrial adenocarcinoma in the differential diagnosis.  The patient was seen by Dr. Denman George and exam revealed a 4+cm cervical mass, not felt to be a candidate definitive surgery alone. Referrals made to medical oncology for weekly cddp and radiation oncology to discuss external beam and brachytherapy  She recently underwent a PET Skull base to thigh on 12/08/17 that showed marked hypermetabolism involving the cervix and uterus with no evidence of parametrial or parauterine tumor and no pelvic or retroperitoneal hypermetabolic lymphadenopathy; L5 vertebral body lesion worrisome for metastasis; hypermetabolic area involving the hepatic flexure region of the colon worrisome for colon cancer; benign left adrenal gland adenoma; and symmetric hypermetabolism in the  tongue base and tonsillar regions that is likely inflamed lymphoid tissue - no neck mass or adenopathy.    she reports associated anxiety, and constipation. she denies chest pain, low back pain, incontinence, headaches, visual problems, cough, breathing problems, numbness or swelling in the lower extremities and any other symptoms.    PREVIOUS RADIATION THERAPY: No  PAST MEDICAL HISTORY:  has a past medical history of Diabetes ([employer]), Hyperlipidemia, Hypertension, Other abnormal glucose, and PONV (postoperative nausea and vomiting).    PAST SURGICAL HISTORY: Past Surgical History:  Procedure Laterality Date  . ADENOIDECTOMY    . DILATION AND CURETTAGE OF UTERUS    . DILATION AND CURETTAGE OF UTERUS  03/11/2011   Procedure: DILATATION AND CURETTAGE;  Surgeon: Darlyn Chamber, MD;  Location: [facility];  Service: Gynecology;  Laterality: N/A;  . HYSTEROSCOPY W/D&C  09-02-2006   ENDOMETRIAL BX'S  . HYSTEROSCOPY WITH RESECTOSCOPE  03/11/2011   Procedure: HYSTEROSCOPY WITH RESECTOSCOPE;  Surgeon: Darlyn Chamber, MD;  Location: [facility];  Service: Gynecology;  Laterality: N/A;    FAMILY HISTORY: family history includes Cancer in her cousin and paternal aunt; Heart attack in her mother; Hyperlipidemia in her other; Hypertension in her father, mother, and other; Stroke in her brother.  SOCIAL HISTORY:  reports that she has never smoked. She has never used smokeless tobacco. She reports that she does not drink alcohol or use drugs.  ALLERGIES: Clarithromycin; Dilacor [diltiazem hcl]; and Norvasc [amlodipine besylate]  MEDICATIONS:  Current Outpatient Medications  Medication Sig Dispense Refill  . amLODipine (NORVASC) 10 MG tablet Take 1 tablet by mouth every evening.     . Ascorbic Acid (VITAMIN C)  100 MG tablet Take 100 mg by mouth daily.    Marland Kitchen atenolol (TENORMIN) 50 MG tablet Take 50 mg by mouth daily.  1  . Cholecalciferol (VITAMIN D3) 5000 units CAPS Take 1  capsule by mouth daily.    . enalapril (VASOTEC) 20 MG tablet Take 20 mg by mouth daily.  1  . fish oil-omega-3 fatty acids 1000 MG capsule Take 1 g by mouth daily.     Marland Kitchen ibuprofen (ADVIL,MOTRIN) 200 MG tablet Take 200 mg by mouth every 6 (six) hours as needed.    Marland Kitchen spironolactone (ALDACTONE) 25 MG tablet Take 1 tablet (25 mg total) by mouth daily. 90 tablet 3  . beta carotene w/minerals (OCUVITE) tablet Take 1 tablet by mouth daily.    . cyclobenzaprine (FLEXERIL) 10 MG tablet TAKE 1 TABLET BY MOUTH THREE TIMES DAILY AS NEEDED FOR 10 DAYS  0   No current facility-administered medications for this encounter.     REVIEW OF SYSTEMS:  A 10+ POINT REVIEW OF SYSTEMS WAS OBTAINED including neurology, dermatology, psychiatry, cardiac, respiratory, lymph, extremities, GI, GU, musculoskeletal, constitutional, reproductive, HEENT. All pertinent positives are noted in the HPI. All others are negative.    PHYSICAL EXAM:  height is 5' 1.5" (1.562 m) and weight is 185 lb 12.8 oz (84.3 kg). Her oral temperature is 98.2 F (36.8 C). Her blood pressure is 159/84 (abnormal) and her pulse is 61. Her respiration is 20 and oxygen saturation is 100%.   General: Alert and oriented, in no acute distress HEENT: Head is normocephalic. Extraocular movements are intact. Oropharynx is clear. Neck: Neck is supple, no palpable cervical or supraclavicular lymphadenopathy. Heart: Regular in rate and rhythm with no murmurs, rubs, or gallops. Chest: Clear to auscultation bilaterally, with no rhonchi, wheezes, or rales. Abdomen: Soft, nontender, nondistended, with no rigidity or guarding. Extremities: No cyanosis or edema. Lymphatics: see Neck Exam Skin: No concerning lesions. Musculoskeletal: symmetric strength and muscle tone throughout. Neurologic: Cranial nerves II through XII are grossly intact. No obvious focalities. Speech is fluent. Coordination is intact. Psychiatric: Judgment and insight are intact. Affect is  appropriate.  Pelvic exam delayed in light of new findings on PET scan.     ECOG = 0  0 - Asymptomatic (Fully active, able to carry on all predisease activities without restriction)  1 - Symptomatic but completely ambulatory (Restricted in physically strenuous activity but ambulatory and able to carry out work of a light or sedentary nature. For example, light housework, office work)  2 - Symptomatic, <50% in bed during the day (Ambulatory and capable of all self care but unable to carry out any work activities. Up and about more than 50% of waking hours)  3 - Symptomatic, >50% in bed, but not bedbound (Capable of only limited self-care, confined to bed or chair 50% or more of waking hours)  4 - Bedbound (Completely disabled. Cannot carry on any self-care. Totally confined to bed or chair)  5 - Death   Eustace Pen MM, Creech RH, Tormey DC, et al. [phone]). "Toxicity and response criteria of the [facility]". [place] Oncol. 5 (6): 649-55  LABORATORY DATA:  Lab Results  Component Value Date   WBC 4.8 03/11/2011   HGB 11.9 (L) 03/11/2011   HCT 35.8 (L) 03/11/2011   MCV 92.5 03/11/2011   PLT 314 03/11/2011   Lab Results  Component Value Date   NA 139 03/11/2011   K 3.6 03/11/2011   CL 101 03/11/2011  CO2 29 03/11/2011   GLUCOSE 118 (H) 03/11/2011   CREATININE 0.75 03/11/2011   CALCIUM 9.8 03/11/2011      RADIOGRAPHY: Nm Pet Image Initial (pi) Skull Base To Thigh  Result Date: 12/09/2017 CLINICAL DATA:  Initial treatment strategy for cervical cancer. EXAM: NUCLEAR MEDICINE PET SKULL BASE TO THIGH TECHNIQUE: 9.5 mCi F-18 FDG was injected intravenously. Full-ring PET imaging was performed from the skull base to thigh after the radiotracer. CT data was obtained and used for attenuation correction and anatomic localization. Fasting blood glucose: 96 mg/dl COMPARISON:  None. FINDINGS: Mediastinal blood pool activity: SUV max 2.91 NECK: Symmetric hypermetabolism  noted in the base of the tongue and tonsillar regions, consistent with lymphoid tissue which may be due to upper respiratory infection. There is no mass on the CT scan and no adenopathy in the neck. Incidental CT findings: Enlarged multinodular thyroid gland. No worrisome hypermetabolism. CHEST: No enlarged or hypermetabolic mediastinal or hilar lymph nodes. No definite pulmonary nodules. No breast masses, supraclavicular or axillary adenopathy. Incidental CT findings: none ABDOMEN/PELVIS: Focal area of fairly marked hypermetabolism noted in the hepatic flexure region of the colon with some associated soft tissue density on the CT scan. SUV max is 16.55. Recommend colonoscopy for further evaluation and to exclude colon cancer. No worrisome hypermetabolic hepatic lesions. Small cysts are noted. There is a 16 mm left adrenal gland nodule which measures 1 Hounsfield units and is consistent with a benign adenoma. It is mildly hypermetabolic which is not atypical. SUV max is 5.0. The uterus and cervix are markedly hypermetabolic with SUV max of 32.35. A large left-sided uterine fibroid is noted. The epicenter appears to be the cervix and I would favor this being cervical cancer that has extended up into the uterus. No pelvic sidewall or locoregional lymphadenopathy. No hydronephrosis. No mesenteric or retroperitoneal lymphadenopathy. Incidental CT findings: Moderate distal aortic and iliac artery atherosclerotic calcifications. SKELETON: There is a hypermetabolic lesion involving the L5 vertebral body. SUV max is 12.04. Mixed lytic and sclerotic appearance on the CT scan. I do not see any other definite bone metastasis. Incidental CT findings: Advanced facet disease in the lower lumbar spine. IMPRESSION: 1. Marked hypermetabolism involving the cervix and uterus. Difficult to be certain of the origin but favor cervix. No evidence of parametrial or parauterine tumor and no pelvic or retroperitoneal hypermetabolic  lymphadenopathy. 2. L5 vertebral body lesion worrisome for metastasis. 3. Hypermetabolic area involving the hepatic flexure region of the colon is worrisome for colon cancer. Recommend colonoscopy. 4. Benign left adrenal gland adenoma. 5. Symmetric hypermetabolism in the tongue base and tonsillar regions is likely inflamed lymphoid tissue. No neck mass or adenopathy. Electronically Signed   By: Marijo Sanes M.D.   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 The PET scan is a surprise finding. Pt is completely asymptomatic with no pain in the lumbar spine or pelvis region. The activity on the PET scan, however, is concerning for metastatic disease. I would recommend biopsy of this area, but will discuss with Dr. Denman George to get her input. If the CT guided biopsy is not a safe proc then we may want to consider MRI of the lumbar spine for further evaluation.   We discussed the general course of radiation for early stage cervical cancer with the patient and her cousin. We discussed some of the general side effects with this treatment. Further discussion after staging issues clarified.  PLAN: Pending further evaluation of the L5 lesion.   ------------------------------------------------  Blair Promise, PhD, MD      This document serves as a record of services personally performed by Gery Pray, MD. It was created on his behalf by Mary-Margaret Loma Messing, a trained medical scribe. The creation of this record is based on the scribe's personal observations and the provider's statements to them. This document has been checked and approved by the attending provider.

## 2017-12-13 ENCOUNTER — Telehealth (HOSPITAL_COMMUNITY): Payer: Self-pay

## 2017-12-13 ENCOUNTER — Other Ambulatory Visit: Payer: Self-pay | Admitting: Gynecologic Oncology

## 2017-12-13 DIAGNOSIS — M899 Disorder of bone, unspecified: Secondary | ICD-10-CM

## 2017-12-13 NOTE — Progress Notes (Unsigned)
MR lumbar spine with contrast ordered at request of Dr Kathlene Cote for planning purposes prior to attempting biopsy.  Thereasa Solo, MD

## 2017-12-13 NOTE — Progress Notes (Signed)
[place] OFFICE PROGRESS NOTE  Patient Care Team: Toni Contras, MD as PCP - General (Family Medicine) Minus Breeding, MD as PCP - Cardiology (Cardiology)  HEME/ONC OVERVIEW: 1. 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 Sondra Come of radiation oncology on 12/12/2017, who has ordered CT-guided biopsy of the L5 vertebral lesion (scheduled on 12/26/2017) -Pending the pathology from the biopsy, if metastatic disease is confirmed, then she will require palliative chemotherapy with carbo/Taxol with or without Avastin; we will also request PD-L1 stain on the biopsy if it is confirmed to be metastatic disease  -If the biopsy is negative for malignancy, then she will likely require concurrent chemoradiation with weekly cisplatin  -As the patient will require chemotherapy (either in definitive or  metastatic setting), I will go ahead and order port placement  L5 vertebral body lesion -As discussed above, CT-guided biopsy of the bony lesion has been ordered to assess for possible metastatic disease -Patient is currently asymptomatic at this time  Incidental colonic lesion near the hepatic flexure -I will refer the patient to gastroenterology for diagnostic colonoscopy -If colonoscopy shows second primary malignancy, the patient will warrant genetic evaluation to rule out any genetic syndrome  Orders Placed This Encounter  Procedures  . IR IMAGING GUIDED PORT INSERTION    Standing Status:   Future    Standing Expiration Date:   02/14/2019    Order Specific Question:   Reason for Exam (SYMPTOM  OR DIAGNOSIS REQUIRED)    Answer:   Chemotherapy access for cervical cancer; pt is already scheduled for CT-guided bx on 12/21/2017    Order Specific Question:   Preferred Imaging Location?    Answer:   [facility]  . CBC with Differential ([place] Only)    Standing Status:   Future    Standing Expiration Date:   01/18/2019  . CMP ([place] only)    Standing Status:   Future    Standing Expiration Date:   01/18/2019  . Magnesium    Standing Status:   Future    Standing Expiration Date:   01/18/2019  . Ambulatory referral to Gastroenterology    Referral Priority:   Urgent    Referral Type:   Consultation    Referral Reason:   Specialty Services Required    Number of Visits Requested:   1   All questions were answered. The patient knows to call the clinic with any problems, questions or concerns. No barriers to learning was detected.  A total of more than 25 minutes were spent face-to-face with the  patient during this encounter and over half of that time was spent on counseling and coordination of care as outlined above.   Return to clinic on 12/28/2017 to follow up biopsy results and determine the next step of treatment.   Tish Men, MD 12/14/2017 3:57 PM  CHIEF  COMPLAINT: "I am here for my scan results"  INTERVAL HISTORY: Toni Peterson returns to clinic for follow-up of recent PET scan results.  Patient reports that she had one episode of night sweat this week, but otherwise denies any fever, chill, weight change, chest pain, dyspnea, abdominal pain, nausea/vomiting, diarrhea, or back pain.  SUMMARY OF ONCOLOGIC HISTORY:   Cervical cancer ([place])   09/08/2017 Initial Diagnosis    Patient has a history of symptomatic endometrial fibroids in 2013 for which she underwent hysteroscopic resection with benign pathology.  She has had lifelong normal Pap smears including in 2019.  Of note HPV testing was not performed in this Pap in 2019.  She developed symptoms of very light vaginal spotting in August 2019     11/22/2017 Procedure    She was seen by her gynecologist, Dr. Radene Knee, who performed a pelvic examination.  On physical examination and endocervical mass was appreciated and felt to be likely to be a polyp or fibroid was biopsied on 11/22/17.       11/22/2017 Pathology Results    This revealed poorly differentiated invasive carcinoma.  Immunostains revealed that it was CK7 positive, P 16+, p53 positive, PAX 8+ and CK 5 6 weakly positive.  CK 20, ER PR and p53 immunostains were negative.  The morphology and Immuno profile favored a gynecologic primary with endocervical and endometrial adenocarcinoma in the differential diagnosis.     11/22/2017 Imaging    An ultrasound scan on November 22, 2017 revealed several intramural fibroids.  With saline infusion there was a 1.8 cm density within the endometrial cavity which could be a fibroid or polyp.     12/08/2017 Imaging    PET:  1. Marked hypermetabolism involving the cervix and uterus. Difficult to be certain of the origin but favor cervix. No evidence of parametrial or parauterine tumor and no pelvic or retroperitoneal hypermetabolic lymphadenopathy. 2. L5 vertebral body lesion worrisome for metastasis. 3.  Hypermetabolic area involving the hepatic flexure region of the colon is worrisome for colon cancer. Recommend colonoscopy. 4. Benign left adrenal gland adenoma. 5. Symmetric hypermetabolism in the tongue base and tonsillar regions is likely inflamed lymphoid tissue. No neck mass or adenopathy.     REVIEW OF SYSTEMS:   Constitutional: ( - ) fevers, ( - )  chills , ( - ) night sweats Eyes: ( - ) blurriness of vision, ( - ) double vision, ( - ) watery eyes Ears, nose, mouth, throat, and face: ( - ) mucositis, ( - ) sore throat Respiratory: ( - ) cough, ( - ) dyspnea, ( - ) wheezes Cardiovascular: ( - ) palpitation, ( - ) chest discomfort, ( - ) lower extremity swelling Gastrointestinal:  ( - ) nausea, ( - ) heartburn, ( - ) change in bowel habits Skin: ( - ) abnormal skin rashes Lymphatics: ( - ) new lymphadenopathy, ( - ) easy bruising Neurological: ( - ) numbness, ( - ) tingling, ( - ) new weaknesses Behavioral/Psych: ( - ) mood change, ( - ) new changes  All other systems were reviewed with the patient and are negative.  I have reviewed the past medical history, past surgical history, social history and  family history with the patient and they are unchanged from previous note.  ALLERGIES:  is allergic to clarithromycin; dilacor [diltiazem hcl]; and norvasc [amlodipine besylate].  MEDICATIONS:  Current Outpatient Medications  Medication Sig Dispense Refill  . amLODipine (NORVASC) 10 MG tablet Take 1 tablet by mouth every evening.     . Ascorbic Acid (VITAMIN C) 100 MG tablet Take 100 mg by mouth daily.    Marland Kitchen atenolol (TENORMIN) 50 MG tablet Take 50 mg by mouth daily.  1  . beta carotene w/minerals (OCUVITE) tablet Take 1 tablet by mouth daily.    . Cholecalciferol (VITAMIN D3) 5000 units CAPS Take 1 capsule by mouth daily.    . cyclobenzaprine (FLEXERIL) 10 MG tablet TAKE 1 TABLET BY MOUTH THREE TIMES DAILY AS NEEDED FOR 10 DAYS  0  . enalapril (VASOTEC) 20 MG tablet Take 20 mg by  mouth daily.  1  . fish oil-omega-3 fatty acids 1000 MG capsule Take 1 g by mouth daily.     Marland Kitchen ibuprofen (ADVIL,MOTRIN) 200 MG tablet Take 200 mg by mouth every 6 (six) hours as needed.    Marland Kitchen spironolactone (ALDACTONE) 25 MG tablet Take 1 tablet (25 mg total) by mouth daily. 90 tablet 3   No current facility-administered medications for this visit.     PHYSICAL EXAMINATION: ECOG PERFORMANCE STATUS: 0 - Asymptomatic  Today's Vitals   12/14/17 1306 12/14/17 1308  BP: (!) 144/72   Pulse: (!) 59   Resp: 17   Temp: 98 F (36.7 C)   TempSrc: Oral   SpO2: 100%   Weight: 185 lb 12.8 oz (84.3 kg)   Height: 5' 1.5" (1.562 m)   PainSc:  3    Body mass index is 34.54 kg/m.  Filed Weights   12/14/17 1306  Weight: 185 lb 12.8 oz (84.3 kg)    GENERAL: alert, no distress and comfortable SKIN: skin color, texture, turgor are normal, no rashes or significant lesions EYES: conjunctiva are pink and non-injected, sclera clear OROPHARYNX: no exudate, no erythema; lips, buccal mucosa, and tongue normal  NECK: supple, non-tender LYMPH:  no palpable lymphadenopathy in the cervical or axillary  LUNGS: clear to auscultation and percussion with normal breathing effort HEART: regular rate & rhythm and no murmurs and no lower extremity edema ABDOMEN: soft, non-tender, non-distended, normal bowel sounds Musculoskeletal: no cyanosis of digits and no clubbing  PSYCH: alert & oriented x 3, fluent speech NEURO: no focal motor/sensory deficits  LABORATORY DATA:  I have reviewed the data as listed    Component Value Date/Time   NA 139 03/11/2011 0615   K 3.6 03/11/2011 0615   CL 101 03/11/2011 0615   CO2 29 03/11/2011 0615   GLUCOSE 118 (H) 03/11/2011 0615   BUN 14 03/11/2011 0615   CREATININE 0.75 03/11/2011 0615   CALCIUM 9.8 03/11/2011 0615   PROT 7.2 03/11/2011 0615   ALBUMIN 3.6 03/11/2011 0615   AST 17 03/11/2011 0615   ALT 18 03/11/2011 0615   ALKPHOS 52 03/11/2011 0615   BILITOT 0.2  (L) 03/11/2011 0615   GFRNONAA >90 03/11/2011 0615   GFRAA >90 03/11/2011 0615    No results found for: SPEP, UPEP  Lab Results  Component Value Date   WBC 4.8 03/11/2011   HGB 11.9 (L) 03/11/2011   HCT 35.8 (L) 03/11/2011   MCV 92.5 03/11/2011   PLT 314 03/11/2011      Chemistry      Component Value Date/Time   NA 139 03/11/2011  0615   K 3.6 03/11/2011 0615   CL 101 03/11/2011 0615   CO2 29 03/11/2011 0615   BUN 14 03/11/2011 0615   CREATININE 0.75 03/11/2011 0615      Component Value Date/Time   CALCIUM 9.8 03/11/2011 0615   ALKPHOS 52 03/11/2011 0615   AST 17 03/11/2011 0615   ALT 18 03/11/2011 0615   BILITOT 0.2 (L) 03/11/2011 0615       RADIOGRAPHIC STUDIES: I have personally reviewed the radiological images as listed below and agreed with the findings in the report. Nm Pet Image Initial (pi) Skull Base To Thigh  Result Date: 12/09/2017 CLINICAL DATA:  Initial treatment strategy for cervical cancer. EXAM: NUCLEAR MEDICINE PET SKULL BASE TO THIGH TECHNIQUE: 9.5 mCi F-18 FDG was injected intravenously. Full-ring PET imaging was performed from the skull base to thigh after the radiotracer. CT data was obtained and used for attenuation correction and anatomic localization. Fasting blood glucose: 96 mg/dl COMPARISON:  None. FINDINGS: Mediastinal blood pool activity: SUV max 2.91 NECK: Symmetric hypermetabolism noted in the base of the tongue and tonsillar regions, consistent with lymphoid tissue which may be due to upper respiratory infection. There is no mass on the CT scan and no adenopathy in the neck. Incidental CT findings: Enlarged multinodular thyroid gland. No worrisome hypermetabolism. CHEST: No enlarged or hypermetabolic mediastinal or hilar lymph nodes. No definite pulmonary nodules. No breast masses, supraclavicular or axillary adenopathy. Incidental CT findings: none ABDOMEN/PELVIS: Focal area of fairly marked hypermetabolism noted in the hepatic flexure region of  the colon with some associated soft tissue density on the CT scan. SUV max is 16.55. Recommend colonoscopy for further evaluation and to exclude colon cancer. No worrisome hypermetabolic hepatic lesions. Small cysts are noted. There is a 16 mm left adrenal gland nodule which measures 1 Hounsfield units and is consistent with a benign adenoma. It is mildly hypermetabolic which is not atypical. SUV max is 5.0. The uterus and cervix are markedly hypermetabolic with SUV max of 22.48. A large left-sided uterine fibroid is noted. The epicenter appears to be the cervix and I would favor this being cervical cancer that has extended up into the uterus. No pelvic sidewall or locoregional lymphadenopathy. No hydronephrosis. No mesenteric or retroperitoneal lymphadenopathy. Incidental CT findings: Moderate distal aortic and iliac artery atherosclerotic calcifications. SKELETON: There is a hypermetabolic lesion involving the L5 vertebral body. SUV max is 12.04. Mixed lytic and sclerotic appearance on the CT scan. I do not see any other definite bone metastasis. Incidental CT findings: Advanced facet disease in the lower lumbar spine. IMPRESSION: 1. Marked hypermetabolism involving the cervix and uterus. Difficult to be certain of the origin but favor cervix. No evidence of parametrial or parauterine tumor and no pelvic or retroperitoneal hypermetabolic lymphadenopathy. 2. L5 vertebral body lesion worrisome for metastasis. 3. Hypermetabolic area involving the hepatic flexure region of the colon is worrisome for colon cancer. Recommend colonoscopy. 4. Benign left adrenal gland adenoma. 5. Symmetric hypermetabolism in the tongue base and tonsillar regions is likely inflamed lymphoid tissue. No neck mass or adenopathy. Electronically Signed   By: Marijo Sanes M.D.   On: 12/09/2017 09:18

## 2017-12-13 NOTE — Telephone Encounter (Signed)
Called to schedule biopsy, no answer, left vm. AW 

## 2017-12-14 ENCOUNTER — Inpatient Hospital Stay (HOSPITAL_BASED_OUTPATIENT_CLINIC_OR_DEPARTMENT_OTHER): Payer: BC Managed Care – PPO | Admitting: Hematology

## 2017-12-14 ENCOUNTER — Telehealth: Payer: Self-pay | Admitting: Hematology

## 2017-12-14 ENCOUNTER — Ambulatory Visit: Payer: BC Managed Care – PPO | Admitting: Hematology

## 2017-12-14 ENCOUNTER — Encounter: Payer: Self-pay | Admitting: Hematology

## 2017-12-14 ENCOUNTER — Encounter: Payer: Self-pay | Admitting: Oncology

## 2017-12-14 VITALS — BP 144/72 | HR 59 | Temp 98.0°F | Resp 17 | Ht 61.5 in | Wt 185.8 lb

## 2017-12-14 DIAGNOSIS — R833 Abnormal level of substances chiefly nonmedicinal as to source in cerebrospinal fluid: Secondary | ICD-10-CM

## 2017-12-14 DIAGNOSIS — C539 Malignant neoplasm of cervix uteri, unspecified: Secondary | ICD-10-CM | POA: Diagnosis not present

## 2017-12-14 DIAGNOSIS — Z79899 Other long term (current) drug therapy: Secondary | ICD-10-CM

## 2017-12-14 DIAGNOSIS — K639 Disease of intestine, unspecified: Secondary | ICD-10-CM

## 2017-12-14 DIAGNOSIS — R933 Abnormal findings on diagnostic imaging of other parts of digestive tract: Secondary | ICD-10-CM | POA: Diagnosis not present

## 2017-12-14 DIAGNOSIS — C530 Malignant neoplasm of endocervix: Secondary | ICD-10-CM

## 2017-12-14 DIAGNOSIS — C7951 Secondary malignant neoplasm of bone: Secondary | ICD-10-CM | POA: Diagnosis not present

## 2017-12-14 NOTE — Progress Notes (Signed)
Toni Peterson with appointment for MRI Lumbar spine on Monday, 12/19/17 at 7 pm.  She verbalized understanding and agreement.

## 2017-12-14 NOTE — Telephone Encounter (Signed)
Appts scheduled avs/calendar printed per 11/6 los °

## 2017-12-15 ENCOUNTER — Other Ambulatory Visit: Payer: Self-pay | Admitting: Oncology

## 2017-12-15 DIAGNOSIS — C530 Malignant neoplasm of endocervix: Secondary | ICD-10-CM

## 2017-12-19 ENCOUNTER — Ambulatory Visit (HOSPITAL_COMMUNITY)
Admission: RE | Admit: 2017-12-19 | Discharge: 2017-12-19 | Disposition: A | Discharge status: Home / Self Care | Payer: BC Managed Care – PPO | Source: Ambulatory Visit | Attending: Gynecologic Oncology | Admitting: Gynecologic Oncology

## 2017-12-19 ENCOUNTER — Telehealth: Payer: Self-pay | Admitting: Oncology

## 2017-12-19 ENCOUNTER — Encounter (HOSPITAL_COMMUNITY): Payer: Self-pay

## 2017-12-19 ENCOUNTER — Other Ambulatory Visit: Payer: Self-pay | Admitting: Radiology

## 2017-12-19 DIAGNOSIS — M48061 Spinal stenosis, lumbar region without neurogenic claudication: Secondary | ICD-10-CM | POA: Diagnosis not present

## 2017-12-19 DIAGNOSIS — M899 Disorder of bone, unspecified: Secondary | ICD-10-CM | POA: Diagnosis present

## 2017-12-19 MED ORDER — GADOBUTROL 1 MMOL/ML IV SOLN
8.0000 mL | Freq: Once | INTRAVENOUS | Status: AC | PRN
  Administered 2017-12-19: 9 mL via INTRAVENOUS

## 2017-12-19 NOTE — Telephone Encounter (Signed)
Toni Peterson called and had questions about when her port will be placed.  She thought the appointment was for 12/28/17.  Advised her that that appointment was for labs and for a port flush.  The port insertion appointment will be on 12/23/17.  She verbalized understanding and agreement.

## 2017-12-20 ENCOUNTER — Ambulatory Visit (INDEPENDENT_AMBULATORY_CARE_PROVIDER_SITE_OTHER): Payer: BC Managed Care – PPO

## 2017-12-20 ENCOUNTER — Other Ambulatory Visit: Payer: Self-pay

## 2017-12-20 ENCOUNTER — Ambulatory Visit (HOSPITAL_BASED_OUTPATIENT_CLINIC_OR_DEPARTMENT_OTHER): Payer: BC Managed Care – PPO

## 2017-12-20 DIAGNOSIS — G959 Disease of spinal cord, unspecified: Secondary | ICD-10-CM | POA: Diagnosis not present

## 2017-12-20 DIAGNOSIS — E042 Nontoxic multinodular goiter: Secondary | ICD-10-CM | POA: Diagnosis not present

## 2017-12-20 DIAGNOSIS — C412 Malignant neoplasm of vertebral column: Secondary | ICD-10-CM | POA: Diagnosis not present

## 2017-12-20 DIAGNOSIS — E785 Hyperlipidemia, unspecified: Secondary | ICD-10-CM | POA: Diagnosis not present

## 2017-12-20 DIAGNOSIS — Z881 Allergy status to other antibiotic agents status: Secondary | ICD-10-CM | POA: Diagnosis not present

## 2017-12-20 DIAGNOSIS — Z9889 Other specified postprocedural states: Secondary | ICD-10-CM | POA: Diagnosis not present

## 2017-12-20 DIAGNOSIS — Z79899 Other long term (current) drug therapy: Secondary | ICD-10-CM | POA: Diagnosis not present

## 2017-12-20 DIAGNOSIS — I517 Cardiomegaly: Secondary | ICD-10-CM

## 2017-12-20 DIAGNOSIS — Z8249 Family history of ischemic heart disease and other diseases of the circulatory system: Secondary | ICD-10-CM | POA: Diagnosis not present

## 2017-12-20 DIAGNOSIS — R079 Chest pain, unspecified: Secondary | ICD-10-CM | POA: Diagnosis not present

## 2017-12-20 DIAGNOSIS — C539 Malignant neoplasm of cervix uteri, unspecified: Secondary | ICD-10-CM | POA: Diagnosis not present

## 2017-12-20 DIAGNOSIS — Z888 Allergy status to other drugs, medicaments and biological substances status: Secondary | ICD-10-CM | POA: Diagnosis not present

## 2017-12-20 DIAGNOSIS — I110 Hypertensive heart disease with heart failure: Secondary | ICD-10-CM | POA: Diagnosis not present

## 2017-12-20 DIAGNOSIS — Z823 Family history of stroke: Secondary | ICD-10-CM | POA: Diagnosis not present

## 2017-12-20 DIAGNOSIS — Z803 Family history of malignant neoplasm of breast: Secondary | ICD-10-CM | POA: Diagnosis not present

## 2017-12-20 DIAGNOSIS — E119 Type 2 diabetes mellitus without complications: Secondary | ICD-10-CM | POA: Diagnosis not present

## 2017-12-20 DIAGNOSIS — I5189 Other ill-defined heart diseases: Secondary | ICD-10-CM

## 2017-12-20 DIAGNOSIS — I420 Dilated cardiomyopathy: Secondary | ICD-10-CM | POA: Diagnosis not present

## 2017-12-20 DIAGNOSIS — I5030 Unspecified diastolic (congestive) heart failure: Secondary | ICD-10-CM | POA: Diagnosis not present

## 2017-12-20 DIAGNOSIS — Z87891 Personal history of nicotine dependence: Secondary | ICD-10-CM | POA: Diagnosis not present

## 2017-12-20 HISTORY — DX: Other ill-defined heart diseases: I51.89

## 2017-12-20 HISTORY — DX: Cardiomegaly: I51.7

## 2017-12-20 LAB — EXERCISE TOLERANCE TEST
CHL CUP MPHR: 155 {beats}/min
CHL CUP RESTING HR STRESS: 62 {beats}/min
CHL RATE OF PERCEIVED EXERTION: 17
CSEPEDS: 0 s
Estimated workload: 7.6 METS
Exercise duration (min): 7 min
Peak HR: 142 {beats}/min
Percent HR: 91 %

## 2017-12-20 LAB — POCT I-STAT CREATININE: Creatinine, Ser: 0.6 mg/dL (ref 0.44–1.00)

## 2017-12-21 ENCOUNTER — Other Ambulatory Visit: Payer: Self-pay

## 2017-12-21 ENCOUNTER — Other Ambulatory Visit: Payer: Self-pay | Admitting: Radiology

## 2017-12-21 ENCOUNTER — Other Ambulatory Visit: Payer: Self-pay | Admitting: Gynecologic Oncology

## 2017-12-21 ENCOUNTER — Ambulatory Visit (HOSPITAL_COMMUNITY)
Admission: RE | Admit: 2017-12-21 | Discharge: 2017-12-21 | Disposition: A | Discharge status: Home / Self Care | Payer: BC Managed Care – PPO | Source: Ambulatory Visit | Attending: Gynecologic Oncology | Admitting: Gynecologic Oncology

## 2017-12-21 ENCOUNTER — Encounter (HOSPITAL_COMMUNITY): Payer: Self-pay

## 2017-12-21 DIAGNOSIS — I420 Dilated cardiomyopathy: Secondary | ICD-10-CM | POA: Insufficient documentation

## 2017-12-21 DIAGNOSIS — E785 Hyperlipidemia, unspecified: Secondary | ICD-10-CM | POA: Insufficient documentation

## 2017-12-21 DIAGNOSIS — Z8249 Family history of ischemic heart disease and other diseases of the circulatory system: Secondary | ICD-10-CM | POA: Insufficient documentation

## 2017-12-21 DIAGNOSIS — E119 Type 2 diabetes mellitus without complications: Secondary | ICD-10-CM | POA: Insufficient documentation

## 2017-12-21 DIAGNOSIS — Z79899 Other long term (current) drug therapy: Secondary | ICD-10-CM | POA: Insufficient documentation

## 2017-12-21 DIAGNOSIS — Z9889 Other specified postprocedural states: Secondary | ICD-10-CM | POA: Insufficient documentation

## 2017-12-21 DIAGNOSIS — I110 Hypertensive heart disease with heart failure: Secondary | ICD-10-CM | POA: Insufficient documentation

## 2017-12-21 DIAGNOSIS — I5030 Unspecified diastolic (congestive) heart failure: Secondary | ICD-10-CM | POA: Insufficient documentation

## 2017-12-21 DIAGNOSIS — Z87891 Personal history of nicotine dependence: Secondary | ICD-10-CM | POA: Insufficient documentation

## 2017-12-21 DIAGNOSIS — Z823 Family history of stroke: Secondary | ICD-10-CM | POA: Insufficient documentation

## 2017-12-21 DIAGNOSIS — C412 Malignant neoplasm of vertebral column: Secondary | ICD-10-CM | POA: Insufficient documentation

## 2017-12-21 DIAGNOSIS — Z881 Allergy status to other antibiotic agents status: Secondary | ICD-10-CM | POA: Insufficient documentation

## 2017-12-21 DIAGNOSIS — Z803 Family history of malignant neoplasm of breast: Secondary | ICD-10-CM | POA: Insufficient documentation

## 2017-12-21 DIAGNOSIS — C539 Malignant neoplasm of cervix uteri, unspecified: Secondary | ICD-10-CM | POA: Insufficient documentation

## 2017-12-21 DIAGNOSIS — G959 Disease of spinal cord, unspecified: Secondary | ICD-10-CM | POA: Insufficient documentation

## 2017-12-21 DIAGNOSIS — E042 Nontoxic multinodular goiter: Secondary | ICD-10-CM | POA: Insufficient documentation

## 2017-12-21 DIAGNOSIS — Z888 Allergy status to other drugs, medicaments and biological substances status: Secondary | ICD-10-CM | POA: Insufficient documentation

## 2017-12-21 HISTORY — PX: IR FLUORO GUIDED NEEDLE PLC ASPIRATION/INJECTION LOC: IMG2395

## 2017-12-21 LAB — CBC
HCT: 35.8 % — ABNORMAL LOW (ref 36.0–46.0)
Hemoglobin: 11.2 g/dL — ABNORMAL LOW (ref 12.0–15.0)
MCH: 28.6 pg (ref 26.0–34.0)
MCHC: 31.3 g/dL (ref 30.0–36.0)
MCV: 91.3 fL (ref 80.0–100.0)
PLATELETS: 531 10*3/uL — AB (ref 150–400)
RBC: 3.92 MIL/uL (ref 3.87–5.11)
RDW: 15 % (ref 11.5–15.5)
WBC: 5.9 10*3/uL (ref 4.0–10.5)
nRBC: 0 % (ref 0.0–0.2)

## 2017-12-21 LAB — PROTIME-INR
INR: 1.12
Prothrombin Time: 14.3 seconds (ref 11.4–15.2)

## 2017-12-21 LAB — GLUCOSE, CAPILLARY: Glucose-Capillary: 108 mg/dL — ABNORMAL HIGH (ref 70–99)

## 2017-12-21 MED ORDER — HYDROCODONE-ACETAMINOPHEN 5-325 MG PO TABS: 1.0000 | ORAL_TABLET | ORAL | Status: DC | PRN

## 2017-12-21 MED ORDER — FENTANYL CITRATE (PF) 100 MCG/2ML IJ SOLN
INTRAMUSCULAR | Status: AC | PRN
  Administered 2017-12-21 (×2): 50 ug via INTRAVENOUS

## 2017-12-21 MED ORDER — LIDOCAINE HCL 1 % IJ SOLN
INTRAMUSCULAR | Status: AC
  Filled 2017-12-21: qty 20

## 2017-12-21 MED ORDER — SODIUM CHLORIDE 0.9 % IV SOLN
INTRAVENOUS | Status: AC | PRN
  Administered 2017-12-21: 10 mL/h via INTRAVENOUS

## 2017-12-21 MED ORDER — MIDAZOLAM HCL 2 MG/2ML IJ SOLN
INTRAMUSCULAR | Status: AC | PRN
  Administered 2017-12-21 (×2): 1 mg via INTRAVENOUS

## 2017-12-21 MED ORDER — SODIUM CHLORIDE 0.9 % IV SOLN: INTRAVENOUS | Status: DC

## 2017-12-21 MED ORDER — LIDOCAINE HCL 1 % IJ SOLN
INTRAMUSCULAR | Status: AC | PRN
  Administered 2017-12-21: 20 mL
  Administered 2017-12-21: 10 mL

## 2017-12-21 MED ORDER — FENTANYL CITRATE (PF) 100 MCG/2ML IJ SOLN
INTRAMUSCULAR | Status: AC
  Filled 2017-12-21: qty 2

## 2017-12-21 MED ORDER — MIDAZOLAM HCL 2 MG/2ML IJ SOLN
INTRAMUSCULAR | Status: AC
  Filled 2017-12-21: qty 2

## 2017-12-21 NOTE — Discharge Instructions (Signed)
Needle Biopsy, Care After °These instructions give you information about caring for yourself after your procedure. Your doctor may also give you more specific instructions. Call your doctor if you have any problems or questions after your procedure. °Follow these instructions at home: °· Rest as told by your doctor. °· Take medicines only as told by your doctor. °· There are many different ways to close and cover the biopsy site, including stitches (sutures), skin glue, and adhesive strips. Follow instructions from your doctor about: °? How to take care of your biopsy site. °? When and how you should change your bandage (dressing). °? When you should remove your dressing. °? Removing whatever was used to close your biopsy site. °· Check your biopsy site every day for signs of infection. Watch for: °? Redness, swelling, or pain. °? Fluid, blood, or pus. °Contact a doctor if: °· You have a fever. °· You have redness, swelling, or pain at the biopsy site, and it lasts longer than a few days. °· You have fluid, blood, or pus coming from the biopsy site. °· You feel sick to your stomach (nauseous). °· You throw up (vomit). °Get help right away if: °· You are short of breath. °· You have trouble breathing. °· Your chest hurts. °· You feel dizzy or you pass out (faint). °· You have bleeding that does not stop with pressure or a bandage. °· You cough up blood. °· Your belly (abdomen) hurts. °This information is not intended to replace advice given to you by your health care provider. Make sure you discuss any questions you have with your health care provider. °Document Released: 01/08/2008 Document Revised: 07/03/2015 Document Reviewed: 01/21/2014 °Elsevier Interactive Patient Education © 2018 [employer] ° °

## 2017-12-21 NOTE — Procedures (Signed)
  Procedure: Fluoro guided L5 core biopsy x2 EBL:   minimal Complications:  none immediate  See full dictation in [facility].  Dillard Cannon MD Main # [phone] Pager  [phone]

## 2017-12-21 NOTE — H&P (Signed)
Chief Complaint: Patient was seen in consultation today for Lumbar 5 lesion biopsy at the request of Dr Wardell Honour  Referring Physician(s): Dr Maylon Peppers Supervising Physician: Arne Cleveland  Patient Status: [facility] - Out-pt  History of Present Illness: Toni Peterson is a 67 y.o. female   Cervical Ca dx 11/2017 Pathology Results      This revealed poorly differentiated invasive carcinoma. Immunostains revealed that it was CK7 positive, P 16+, p53 positive, PAX 8+ and CK 5 6 weakly positive. CK 20, ER PR and p53 immunostains were negative. The morphology and Immuno profile favored a gynecologic primary with endocervical and endometrial adenocarcinoma in the differential diagnosis     PET 10/31: IMPRESSION: 1. Marked hypermetabolism involving the cervix and uterus. Difficult to be certain of the origin but favor cervix. No evidence of parametrial or parauterine tumor and no pelvic or retroperitoneal hypermetabolic lymphadenopathy. 2. L5 vertebral body lesion worrisome for metastasis. 3. Hypermetabolic area involving the hepatic flexure region of the colon is worrisome for colon cancer. Recommend colonoscopy. 4. Benign left adrenal gland adenoma. 5. Symmetric hypermetabolism in the tongue base and tonsillar regions is likely inflamed lymphoid tissue. No neck mass or Adenopathy.  Lumbar MRI:  IMPRESSION: 1. Diffuse marrow replacement in the L5 vertebral body, indeterminate though metastatic disease is a concern. Correlate with upcoming biopsy. No extraosseous tumor or pathologic fracture.  Now scheduled for L5 Lesion biopsy  Past Medical History:  Diagnosis Date  . Cancer ([place])    Cervical  . Diabetes ([place])   . Hyperlipidemia   . Hypertension   . Other abnormal glucose   . PONV (postoperative nausea and vomiting)    slow awakening after dental visit    Past Surgical History:  Procedure Laterality Date  . ADENOIDECTOMY    . DILATION AND CURETTAGE OF UTERUS    .  DILATION AND CURETTAGE OF UTERUS  03/11/2011   Procedure: DILATATION AND CURETTAGE;  Surgeon: Darlyn Chamber, MD;  Location: [facility];  Service: Gynecology;  Laterality: N/A;  . HYSTEROSCOPY W/D&C  09-02-2006   ENDOMETRIAL BX'S  . HYSTEROSCOPY WITH RESECTOSCOPE  03/11/2011   Procedure: HYSTEROSCOPY WITH RESECTOSCOPE;  Surgeon: Darlyn Chamber, MD;  Location: [facility];  Service: Gynecology;  Laterality: N/A;    Allergies: Clarithromycin; Dilacor [diltiazem hcl]; and Norvasc [amlodipine besylate]  Medications: Prior to Admission medications   Medication Sig Start Date End Date Taking? Authorizing Provider  amLODipine (NORVASC) 10 MG tablet Take 10 mg by mouth daily.  09/01/17  Yes [provider]  atenolol (TENORMIN) 50 MG tablet Take 50 mg by mouth daily. 07/25/17  Yes [provider]  Cholecalciferol (VITAMIN D3) 5000 units CAPS Take 5,000 Units by mouth daily.    Yes [provider]  enalapril (VASOTEC) 20 MG tablet Take 20 mg by mouth daily. 09/22/17  Yes [provider]  fish oil-omega-3 fatty acids 1000 MG capsule Take 1 g by mouth daily.    Yes [provider]  Multiple Vitamin (MULTIVITAMIN WITH MINERALS) TABS tablet Take 1 tablet by mouth daily.   Yes [provider]  spironolactone (ALDACTONE) 25 MG tablet Take 1 tablet (25 mg total) by mouth daily. 09/27/17 12/26/17 Yes Minus Breeding, MD     Family History  Problem Relation Age of Onset  . Stroke Brother        Died 44  . Heart attack Mother        Vague  . Hypertension Mother   .  Hyperlipidemia Other   . Hypertension Other   . Hypertension Father   . Cancer Paternal Aunt        breast ca  . Cancer Cousin        breast ca    Social History   Socioeconomic History  . Marital status: Single    Spouse name: Not on file  . Number of children: 1  . Years of education: Not on file  . Highest education level: Not on file  Occupational  History  . Not on file  Social Needs  . Financial resource strain: Not on file  . Food insecurity:    Worry: Not on file    Inability: Not on file  . Transportation needs:    Medical: Not on file    Non-medical: Not on file  Tobacco Use  . Smoking status: Never Smoker  . Smokeless tobacco: Never Used  Substance and Sexual Activity  . Alcohol use: No  . Drug use: No  . Sexual activity: Not on file  Lifestyle  . Physical activity:    Days per week: Not on file    Minutes per session: Not on file  . Stress: Not on file  Relationships  . Social connections:    Talks on phone: Not on file    Gets together: Not on file    Attends religious service: Not on file    Active member of club or organization: Not on file    Attends meetings of clubs or organizations: Not on file    Relationship status: Not on file  Other Topics Concern  . Not on file  Social History Narrative   Former smoker, Quit in year quit 2003. Tobacco history last updated 11/052014.no smoking.no Alcohol , no recreational  Drugs   Nothing structured.Occupation : Building services engineer Status Single   Children 1   Religion ; [employer]   sdeat belt use  yes           Review of Systems: A 12 point ROS discussed and pertinent positives are indicated in the HPI above.  All other systems are negative.  Review of Systems  Constitutional: Negative for activity change, fatigue and fever.  Respiratory: Negative for cough and shortness of breath.   Gastrointestinal: Negative for abdominal pain.  Musculoskeletal: Negative for back pain.  Neurological: Negative for weakness.  Psychiatric/Behavioral: Negative for behavioral problems and confusion.    Vital Signs: BP (!) 151/63   Pulse 61   Temp 98.6 F (37 C)   Ht '5\' 1"'$  (1.549 m)   Wt 184 lb (83.5 kg)   SpO2 99%   BMI 34.77 kg/m   Physical Exam  Constitutional: She is oriented to person, place, and time.  Cardiovascular: Normal rate, regular  rhythm and normal heart sounds.  Pulmonary/Chest: Effort normal and breath sounds normal.  Abdominal: Soft. Bowel sounds are normal.  Musculoskeletal: Normal range of motion.  Neurological: She is alert and oriented to person, place, and time.  Skin: Skin is warm and dry.  Psychiatric: She has a normal mood and affect. Her behavior is normal. Judgment and thought content normal.  Vitals reviewed.   Imaging: Mr Lumbar Spine W Wo Contrast  Result Date: 12/20/2017 CLINICAL DATA:  Hypermetabolic L5 vertebral lesion on PET-CT. MRI for biopsy planning. History of cervical cancer. EXAM: MRI LUMBAR SPINE WITHOUT AND WITH CONTRAST TECHNIQUE: Multiplanar and multiecho pulse sequences of the lumbar spine were obtained without and with intravenous contrast. CONTRAST:  9 mL Gadavist COMPARISON:  PET/CT 12/08/2017 FINDINGS: Segmentation:  Standard. Alignment: Facet mediated anterolisthesis of L4 on L5 measuring 5 mm. Vertebrae: There is diffuse replacement of normal marrow signal in the L5 vertebral body with heterogeneous STIR hyperintensity and heterogeneously intense enhancement throughout. No vertebral body height loss or extraosseous tumor is evident. Bilateral posterior element edema may be degenerative in nature given advanced L4-5 facet arthritis. A 12 mm T1 and T2 hypointense focus anteriorly in the T12 vertebral body corresponds to an area of sclerosis on CT and was without significant hypermetabolism reported on PET. There is a small amount of surrounding STIR hyperintensity and enhancement, and there is a similar but smaller area along the T11 inferior endplate measuring 8 mm. The T11 and T12 lesions are associated with anterior vertebral osteophyte formation and are favored to be degenerative. Conus medullaris and cauda equina: Conus extends to the L1 level. Conus and cauda equina appear normal. Paraspinal and other soft tissues: Partial visualization of multiple uterine masses as well as of a mixed  cystic and solid right adnexal mass posterior to the uterus measuring approximately 4.1 x 2.7 cm. Disc levels: Disc desiccation throughout the lumbar and lower thoracic spine. Severe disc space narrowing anteriorly at T11-12 with moderate narrowing at T10-11 and L4-5. T11-12: Only imaged sagittally. Disc bulging without evidence of significant stenosis or spinal cord mass effect. T12-L1: Negative. L1-2: Negative. L2-3: Minimal disc bulging without stenosis. L3-4: Minimal disc bulging without stenosis. L4-5: Anterolisthesis with bulging uncovered disc, mild ligamentum flavum hypertrophy, and severe facet arthrosis result in mild bilateral lateral recess stenosis and mild-to-moderate bilateral neural foraminal stenosis. The facet joints are gaping with large effusions which can be seen in the setting of instability. Small subchondral cysts are associated with the right facet joint, and there is a small synovial cyst posterior to the right facet joint. L5-S1: Circumferential disc bulging and moderate facet arthrosis result in moderate bilateral neural foraminal stenosis without spinal stenosis. There are small facet joint effusions. IMPRESSION: 1. Diffuse marrow replacement in the L5 vertebral body, indeterminate though metastatic disease is a concern. Correlate with upcoming biopsy. No extraosseous tumor or pathologic fracture. 2. Marrow signal abnormality on both sides of the T11-12 disc space favored to be degenerative. 3. Severe L4-5 facet arthrosis with grade 1 anterolisthesis, mild bilateral lateral recess stenosis, and mild-to-moderate bilateral neural foraminal stenosis. Findings may worsen with standing. 4. Moderate bilateral neural foraminal stenosis at L5-S1. 5. Partially visualized uterine masses which may reflect a combination of known adenocarcinoma and fibroids. 4 cm right adnexal mass may reflect an exophytic subserosal fibroid or ovarian neoplasm. Electronically Signed   By: Logan Bores M.D.   On:  12/20/2017 09:09   Nm Pet Image Initial (pi) Skull Base To Thigh  Result Date: 12/09/2017 CLINICAL DATA:  Initial treatment strategy for cervical cancer. EXAM: NUCLEAR MEDICINE PET SKULL BASE TO THIGH TECHNIQUE: 9.5 mCi F-18 FDG was injected intravenously. Full-ring PET imaging was performed from the skull base to thigh after the radiotracer. CT data was obtained and used for attenuation correction and anatomic localization. Fasting blood glucose: 96 mg/dl COMPARISON:  None. FINDINGS: Mediastinal blood pool activity: SUV max 2.91 NECK: Symmetric hypermetabolism noted in the base of the tongue and tonsillar regions, consistent with lymphoid tissue which may be due to upper respiratory infection. There is no mass on the CT scan and no adenopathy in the neck. Incidental CT findings: Enlarged multinodular thyroid gland. No worrisome hypermetabolism. CHEST: No enlarged or hypermetabolic mediastinal  or hilar lymph nodes. No definite pulmonary nodules. No breast masses, supraclavicular or axillary adenopathy. Incidental CT findings: none ABDOMEN/PELVIS: Focal area of fairly marked hypermetabolism noted in the hepatic flexure region of the colon with some associated soft tissue density on the CT scan. SUV max is 16.55. Recommend colonoscopy for further evaluation and to exclude colon cancer. No worrisome hypermetabolic hepatic lesions. Small cysts are noted. There is a 16 mm left adrenal gland nodule which measures 1 Hounsfield units and is consistent with a benign adenoma. It is mildly hypermetabolic which is not atypical. SUV max is 5.0. The uterus and cervix are markedly hypermetabolic with SUV max of 25.36. A large left-sided uterine fibroid is noted. The epicenter appears to be the cervix and I would favor this being cervical cancer that has extended up into the uterus. No pelvic sidewall or locoregional lymphadenopathy. No hydronephrosis. No mesenteric or retroperitoneal lymphadenopathy. Incidental CT findings:  Moderate distal aortic and iliac artery atherosclerotic calcifications. SKELETON: There is a hypermetabolic lesion involving the L5 vertebral body. SUV max is 12.04. Mixed lytic and sclerotic appearance on the CT scan. I do not see any other definite bone metastasis. Incidental CT findings: Advanced facet disease in the lower lumbar spine. IMPRESSION: 1. Marked hypermetabolism involving the cervix and uterus. Difficult to be certain of the origin but favor cervix. No evidence of parametrial or parauterine tumor and no pelvic or retroperitoneal hypermetabolic lymphadenopathy. 2. L5 vertebral body lesion worrisome for metastasis. 3. Hypermetabolic area involving the hepatic flexure region of the colon is worrisome for colon cancer. Recommend colonoscopy. 4. Benign left adrenal gland adenoma. 5. Symmetric hypermetabolism in the tongue base and tonsillar regions is likely inflamed lymphoid tissue. No neck mass or adenopathy. Electronically Signed   By: Marijo Sanes M.D.   On: 12/09/2017 09:18    Labs:  CBC: No results for input(s): WBC, HGB, HCT, PLT in the last 8760 hours.  COAGS: No results for input(s): INR, APTT in the last 8760 hours.  BMP: Recent Labs    12/19/17 1903  CREATININE 0.60    LIVER FUNCTION TESTS: No results for input(s): BILITOT, AST, ALT, ALKPHOS, PROT, ALBUMIN in the last 8760 hours.  TUMOR MARKERS: No results for input(s): AFPTM, CEA, CA199, CHROMGRNA in the last 8760 hours.  Assessment and Plan:  Newly diagnosed cervical Ca Work up revealed L5 lesion Now for biopsy of same Risks and benefits discussed with the patient including, but not limited to bleeding, infection, damage to adjacent structures or low yield requiring additional tests.  All of the patient's questions were answered, patient is agreeable to proceed. Consent signed and in chart.   Thank you for this interesting consult.  I greatly enjoyed meeting Toni Peterson and look forward to participating  in their care.  A copy of this report was sent to the requesting provider on this date.  Electronically Signed: Lavonia Drafts, PA-C 12/21/2017, 10:47 AM   I spent a total of  30 Minutes   in face to face in clinical consultation, greater than 50% of which was counseling/coordinating care for L5 lesion bx

## 2017-12-22 ENCOUNTER — Telehealth: Payer: Self-pay | Admitting: Oncology

## 2017-12-22 ENCOUNTER — Encounter (HOSPITAL_COMMUNITY): Payer: Self-pay | Admitting: Interventional Radiology

## 2017-12-22 ENCOUNTER — Encounter: Payer: Self-pay | Admitting: Gastroenterology

## 2017-12-22 NOTE — Telephone Encounter (Signed)
Requested MSI testing per Dr. Maylon Peppers on L5 biopsy with [facility] in [facility] Pathology.

## 2017-12-23 ENCOUNTER — Other Ambulatory Visit: Payer: Self-pay

## 2017-12-23 ENCOUNTER — Ambulatory Visit (HOSPITAL_COMMUNITY)
Admission: RE | Admit: 2017-12-23 | Discharge: 2017-12-23 | Disposition: A | Discharge status: Home / Self Care | Payer: BC Managed Care – PPO | Source: Ambulatory Visit | Attending: Hematology | Admitting: Hematology

## 2017-12-23 ENCOUNTER — Encounter (HOSPITAL_COMMUNITY): Payer: Self-pay

## 2017-12-23 DIAGNOSIS — I10 Essential (primary) hypertension: Secondary | ICD-10-CM | POA: Insufficient documentation

## 2017-12-23 DIAGNOSIS — E119 Type 2 diabetes mellitus without complications: Secondary | ICD-10-CM | POA: Insufficient documentation

## 2017-12-23 DIAGNOSIS — C539 Malignant neoplasm of cervix uteri, unspecified: Secondary | ICD-10-CM | POA: Diagnosis present

## 2017-12-23 DIAGNOSIS — E785 Hyperlipidemia, unspecified: Secondary | ICD-10-CM | POA: Insufficient documentation

## 2017-12-23 DIAGNOSIS — C530 Malignant neoplasm of endocervix: Secondary | ICD-10-CM

## 2017-12-23 HISTORY — PX: IR IMAGING GUIDED PORT INSERTION: IMG5740

## 2017-12-23 LAB — BASIC METABOLIC PANEL
Anion gap: 9 (ref 5–15)
BUN: 13 mg/dL (ref 8–23)
CHLORIDE: 103 mmol/L (ref 98–111)
CO2: 26 mmol/L (ref 22–32)
CREATININE: 0.67 mg/dL (ref 0.44–1.00)
Calcium: 9.4 mg/dL (ref 8.9–10.3)
GFR calc non Af Amer: >60 mL/min (ref >60)
GLUCOSE: 110 mg/dL — AB (ref 70–99)
Potassium: 4 mmol/L (ref 3.5–5.1)
Sodium: 138 mmol/L (ref 135–145)

## 2017-12-23 LAB — CBC WITH DIFFERENTIAL/PLATELET
Abs Immature Granulocytes: 0.03 10*3/uL (ref 0.00–0.07)
BASOS ABS: 0.1 10*3/uL (ref 0.0–0.1)
Basophils Relative: 1 %
EOS PCT: 1 %
Eosinophils Absolute: 0.1 10*3/uL (ref 0.0–0.5)
HEMATOCRIT: 36.7 % (ref 36.0–46.0)
HEMOGLOBIN: 11.9 g/dL — AB (ref 12.0–15.0)
Immature Granulocytes: 1 %
LYMPHS ABS: 2 10*3/uL (ref 0.7–4.0)
LYMPHS PCT: 33 %
MCH: 30 pg (ref 26.0–34.0)
MCHC: 32.4 g/dL (ref 30.0–36.0)
MCV: 92.4 fL (ref 80.0–100.0)
MONOS PCT: 6 %
Monocytes Absolute: 0.4 10*3/uL (ref 0.1–1.0)
Neutro Abs: 3.6 10*3/uL (ref 1.7–7.7)
Neutrophils Relative %: 58 %
Platelets: 438 10*3/uL — ABNORMAL HIGH (ref 150–400)
RBC: 3.97 MIL/uL (ref 3.87–5.11)
RDW: 14.6 % (ref 11.5–15.5)
WBC: 6.1 10*3/uL (ref 4.0–10.5)
nRBC: 0 % (ref 0.0–0.2)

## 2017-12-23 LAB — GLUCOSE, CAPILLARY: Glucose-Capillary: 103 mg/dL — ABNORMAL HIGH (ref 70–99)

## 2017-12-23 MED ORDER — LIDOCAINE HCL 1 % IJ SOLN
INTRAMUSCULAR | Status: AC
  Filled 2017-12-23: qty 20

## 2017-12-23 MED ORDER — CEFAZOLIN SODIUM-DEXTROSE 2-4 GM/100ML-% IV SOLN
INTRAVENOUS | Status: AC
  Administered 2017-12-23: 2 g via INTRAVENOUS
  Filled 2017-12-23: qty 100

## 2017-12-23 MED ORDER — HEPARIN SOD (PORK) LOCK FLUSH 100 UNIT/ML IV SOLN
INTRAVENOUS | Status: AC | PRN
  Administered 2017-12-23: 500 [IU] via INTRAVENOUS

## 2017-12-23 MED ORDER — MIDAZOLAM HCL 2 MG/2ML IJ SOLN
INTRAMUSCULAR | Status: AC | PRN
  Administered 2017-12-23 (×2): 1 mg via INTRAVENOUS

## 2017-12-23 MED ORDER — SODIUM CHLORIDE 0.9 % IV SOLN
INTRAVENOUS | Status: DC
  Administered 2017-12-23: 12:00:00 via INTRAVENOUS

## 2017-12-23 MED ORDER — MIDAZOLAM HCL 2 MG/2ML IJ SOLN
INTRAMUSCULAR | Status: AC
  Filled 2017-12-23: qty 2

## 2017-12-23 MED ORDER — FENTANYL CITRATE (PF) 100 MCG/2ML IJ SOLN
INTRAMUSCULAR | Status: AC | PRN
  Administered 2017-12-23 (×2): 50 ug via INTRAVENOUS

## 2017-12-23 MED ORDER — FENTANYL CITRATE (PF) 100 MCG/2ML IJ SOLN
INTRAMUSCULAR | Status: AC
  Filled 2017-12-23: qty 2

## 2017-12-23 MED ORDER — LIDOCAINE HCL (PF) 1 % IJ SOLN
INTRAMUSCULAR | Status: AC | PRN
  Administered 2017-12-23 (×2): 10 mL via SUBCUTANEOUS

## 2017-12-23 MED ORDER — CEFAZOLIN SODIUM-DEXTROSE 2-4 GM/100ML-% IV SOLN
2.0000 g | INTRAVENOUS | Status: AC
  Administered 2017-12-23: 2 g via INTRAVENOUS

## 2017-12-23 NOTE — Procedures (Signed)
Interventional Radiology Procedure Note  Procedure: Placement of a right IJ approach single lumen PowerPort.  Tip is positioned at the superior cavoatrial junction and catheter is ready for immediate use.  Complications: No immediate Recommendations:  - Ok to shower tomorrow - Do not submerge for 7 days - Routine line care   Signed,  Azaan Leask K. Rylei Masella, MD   

## 2017-12-23 NOTE — Discharge Instructions (Signed)
implanted Implanted [facility] Guide An implanted port is a type of central line that is placed under the skin. Central lines are used to provide IV access when treatment or nutrition needs to be given through a persons veins. Implanted ports are used for long-term IV access. An implanted port may be placed because:  You need IV medicine that would be irritating to the small veins in your hands or arms.  You need long-term IV medicines, such as antibiotics.  You need IV nutrition for a long period.  You need frequent blood draws for lab tests.  You need dialysis.  Implanted ports are usually placed in the chest area, but they can also be placed in the upper arm, the abdomen, or the leg. An implanted port has two main parts:  Reservoir. The reservoir is round and will appear as a small, raised area under your skin. The reservoir is the part where a needle is inserted to give medicines or draw blood.  Catheter. The catheter is a thin, flexible tube that extends from the reservoir. The catheter is placed into a large vein. Medicine that is inserted into the reservoir goes into the catheter and then into the vein.  How will I care for my incision site? Do not get the incision site wet. Bathe or shower as directed by your health care provider. How is my port accessed? Special steps must be taken to access the port:  Before the port is accessed, a numbing cream can be placed on the skin. This helps numb the skin over the port site.  Your health care provider uses a sterile technique to access the port. ? Your health care provider must put on a mask and sterile gloves. ? The skin over your port is cleaned carefully with an antiseptic and allowed to dry. ? The port is gently pinched between sterile gloves, and a needle is inserted into the port.  Only "non-coring" port needles should be used to access the port. Once the port is accessed, a blood return should be checked. This helps ensure  that the port is in the vein and is not clogged.  If your port needs to remain accessed for a constant infusion, a clear (transparent) bandage will be placed over the needle site. The bandage and needle will need to be changed every week, or as directed by your health care provider.  Keep the bandage covering the needle clean and dry. Do not get it wet. Follow your health care providers instructions on how to take a shower or bath while the port is accessed.  If your port does not need to stay accessed, no bandage is needed over the port.  What is flushing? Flushing helps keep the port from getting clogged. Follow your health care providers instructions on how and when to flush the port. Ports are usually flushed with saline solution or a medicine called heparin. The need for flushing will depend on how the port is used.  If the port is used for intermittent medicines or blood draws, the port will need to be flushed: ? After medicines have been given. ? After blood has been drawn. ? As part of routine maintenance.  If a constant infusion is running, the port may not need to be flushed.  How long will my port stay implanted? The port can stay in for as long as your health care provider thinks it is needed. When it is time for the port to come out, surgery will  be done to remove it. The procedure is similar to the one performed when the port was put in. When should I seek immediate medical care? When you have an implanted port, you should seek immediate medical care if:  You notice a bad smell coming from the incision site.  You have swelling, redness, or drainage at the incision site.  You have more swelling or pain at the port site or the surrounding area.  You have a fever that is not controlled with medicine.  This information is not intended to replace advice given to you by your health care provider. Make sure you discuss any questions you have with your health care  provider. Document Released: 01/25/2005 Document Revised: 07/03/2015 Document Reviewed: 10/02/2012 Elsevier Interactive Patient Education  2017 [place]. Moderate Conscious Sedation, Adult, Care After These instructions provide you with information about caring for yourself after your procedure. Your health care provider may also give you more specific instructions. Your treatment has been planned according to current medical practices, but problems sometimes occur. Call your health care provider if you have any problems or questions after your procedure. What can I expect after the procedure? After your procedure, it is common:  To feel sleepy for several hours.  To feel clumsy and have poor balance for several hours.  To have poor judgment for several hours.  To vomit if you eat too soon.  Follow these instructions at home: For at least 24 hours after the procedure:   Do not: ? Participate in activities where you could fall or become injured. ? Drive. ? Use heavy machinery. ? Drink alcohol. ? Take sleeping pills or medicines that cause drowsiness. ? Make important decisions or sign legal documents. ? Take care of children on your own.  Rest. Eating and drinking  Follow the diet recommended by your health care provider.  If you vomit: ? Drink water, juice, or soup when you can drink without vomiting. ? Make sure you have little or no nausea before eating solid foods. General instructions  Have a responsible adult stay with you until you are awake and alert.  Take over-the-counter and prescription medicines only as told by your health care provider.  If you smoke, do not smoke without supervision.  Keep all follow-up visits as told by your health care provider. This is important. Contact a health care provider if:  You keep feeling nauseous or you keep vomiting.  You feel light-headed.  You develop a rash.  You have a fever. Get help right away if:  You  have trouble breathing. This information is not intended to replace advice given to you by your health care provider. Make sure you discuss any questions you have with your health care provider. Document Released: 11/15/2012 Document Revised: 06/30/2015 Document Reviewed: 05/17/2015 Elsevier Interactive Patient Education  [employer].

## 2017-12-23 NOTE — H&P (Signed)
Referring Physician(s): Zhao,Yan  Supervising Physician: Jacqulynn Cadet  Patient Status:  [facility]  Chief Complaint: "I'm getting a port a cath"   Subjective: Patient familiar to IR service from prior L5 biopsy on 12/21/2017.  She has a history of metastatic poorly differentiated cervical cancer and presents today for Port-A-Cath placement for chemotherapy.  Currently denies fever, headache, chest pain, dyspnea, cough, abdominal pain, nausea, vomiting or bleeding.  She does have persistent back pain.  Past Medical History:  Diagnosis Date  . Cancer ([place])    Cervical  . Diabetes ([place])   . Hyperlipidemia   . Hypertension   . Other abnormal glucose   . PONV (postoperative nausea and vomiting)    slow awakening after dental visit   Past Surgical History:  Procedure Laterality Date  . ADENOIDECTOMY    . DILATION AND CURETTAGE OF UTERUS    . DILATION AND CURETTAGE OF UTERUS  03/11/2011   Procedure: DILATATION AND CURETTAGE;  Surgeon: Darlyn Chamber, MD;  Location: [place];  Service: Gynecology;  Laterality: N/A;  . HYSTEROSCOPY W/D&C  09-02-2006   ENDOMETRIAL BX'S  . HYSTEROSCOPY WITH RESECTOSCOPE  03/11/2011   Procedure: HYSTEROSCOPY WITH RESECTOSCOPE;  Surgeon: Darlyn Chamber, MD;  Location: [facility];  Service: Gynecology;  Laterality: N/A;  . IR FLUORO GUIDED NEEDLE PLC ASPIRATION/INJECTION LOC  12/21/2017      Allergies: Clarithromycin; Dilacor [diltiazem hcl]; and Norvasc [amlodipine besylate]  Medications: Prior to Admission medications   Medication Sig Start Date End Date Taking? Authorizing Provider  amLODipine (NORVASC) 10 MG tablet Take 10 mg by mouth daily.  09/01/17  Yes [provider]  atenolol (TENORMIN) 50 MG tablet Take 50 mg by mouth daily. 07/25/17  Yes [provider]  Cholecalciferol (VITAMIN D3) 5000 units CAPS Take 5,000 Units by mouth daily.    Yes [provider]  enalapril (VASOTEC) 20  MG tablet Take 20 mg by mouth daily. 09/22/17  Yes [provider]  fish oil-omega-3 fatty acids 1000 MG capsule Take 1 g by mouth daily.    Yes [provider]  Multiple Vitamin (MULTIVITAMIN WITH MINERALS) TABS tablet Take 1 tablet by mouth daily.   Yes [provider]  spironolactone (ALDACTONE) 25 MG tablet Take 1 tablet (25 mg total) by mouth daily. 09/27/17 12/26/17 Yes Minus Breeding, MD     Vital Signs: BP 133/77 (BP Location: Right Arm)   Pulse (!) 59   Temp 98.4 F (36.9 C) (Oral)   Resp 16   SpO2 98%   Physical Exam awake, alert.  Chest clear to auscultation bilaterally.  Heart with regular rate and rhythm.  Abdomen soft, positive bowel sounds, nontender.  No lower extremity edema.  Imaging: Mr Lumbar Spine W Wo Contrast  Result Date: 12/20/2017 CLINICAL DATA:  Hypermetabolic L5 vertebral lesion on PET-CT. MRI for biopsy planning. History of cervical cancer. EXAM: MRI LUMBAR SPINE WITHOUT AND WITH CONTRAST TECHNIQUE: Multiplanar and multiecho pulse sequences of the lumbar spine were obtained without and with intravenous contrast. CONTRAST:  9 mL Gadavist COMPARISON:  PET/CT 12/08/2017 FINDINGS: Segmentation:  Standard. Alignment: Facet mediated anterolisthesis of L4 on L5 measuring 5 mm. Vertebrae: There is diffuse replacement of normal marrow signal in the L5 vertebral body with heterogeneous STIR hyperintensity and heterogeneously intense enhancement throughout. No vertebral body height loss or extraosseous tumor is evident. Bilateral posterior element edema may be degenerative in nature given advanced L4-5 facet arthritis. A 12 mm T1 and  T2 hypointense focus anteriorly in the T12 vertebral body corresponds to an area of sclerosis on CT and was without significant hypermetabolism reported on PET. There is a small amount of surrounding STIR hyperintensity and enhancement, and there is a similar but smaller area along the T11 inferior endplate measuring 8 mm.  The T11 and T12 lesions are associated with anterior vertebral osteophyte formation and are favored to be degenerative. Conus medullaris and cauda equina: Conus extends to the L1 level. Conus and cauda equina appear normal. Paraspinal and other soft tissues: Partial visualization of multiple uterine masses as well as of a mixed cystic and solid right adnexal mass posterior to the uterus measuring approximately 4.1 x 2.7 cm. Disc levels: Disc desiccation throughout the lumbar and lower thoracic spine. Severe disc space narrowing anteriorly at T11-12 with moderate narrowing at T10-11 and L4-5. T11-12: Only imaged sagittally. Disc bulging without evidence of significant stenosis or spinal cord mass effect. T12-L1: Negative. L1-2: Negative. L2-3: Minimal disc bulging without stenosis. L3-4: Minimal disc bulging without stenosis. L4-5: Anterolisthesis with bulging uncovered disc, mild ligamentum flavum hypertrophy, and severe facet arthrosis result in mild bilateral lateral recess stenosis and mild-to-moderate bilateral neural foraminal stenosis. The facet joints are gaping with large effusions which can be seen in the setting of instability. Small subchondral cysts are associated with the right facet joint, and there is a small synovial cyst posterior to the right facet joint. L5-S1: Circumferential disc bulging and moderate facet arthrosis result in moderate bilateral neural foraminal stenosis without spinal stenosis. There are small facet joint effusions. IMPRESSION: 1. Diffuse marrow replacement in the L5 vertebral body, indeterminate though metastatic disease is a concern. Correlate with upcoming biopsy. No extraosseous tumor or pathologic fracture. 2. Marrow signal abnormality on both sides of the T11-12 disc space favored to be degenerative. 3. Severe L4-5 facet arthrosis with grade 1 anterolisthesis, mild bilateral lateral recess stenosis, and mild-to-moderate bilateral neural foraminal stenosis. Findings may  worsen with standing. 4. Moderate bilateral neural foraminal stenosis at L5-S1. 5. Partially visualized uterine masses which may reflect a combination of known adenocarcinoma and fibroids. 4 cm right adnexal mass may reflect an exophytic subserosal fibroid or ovarian neoplasm. Electronically Signed   By: Logan Bores M.D.   On: 12/20/2017 09:09   Ir Fluoro Guide Ndl Plmt / Bx  Result Date: 12/22/2017 CLINICAL DATA:  History of cervical carcinoma. Hypermetabolic L5 vertebral body lesion. EXAM: DEEP CORE L5 VERTEBRAL BODY BONE UNDER FLUOROSCOPY MEDICATIONS: None indicated ANESTHESIA/SEDATION: Intravenous Fentanyl and Versed were administered as conscious sedation during continuous monitoring of the patient's level of consciousness and physiological / cardiorespiratory status by the radiology RN, with a total moderate sedation time of 22 minutes. PROCEDURE: Informed written consent was obtained from the patient after a thorough discussion of the procedural risks, benefits and alternatives. All questions were addressed. Maximal Sterile Barrier Technique was utilized including caps, mask, sterile gowns, sterile gloves, sterile drape, hand hygiene and skin antiseptic. A timeout was performed prior to the initiation of the procedure. Patient placed prone. Lumbar region prepped with Betadine, draped in usual sterile fashion. After local infiltration with 1% lidocaine, a Kyphon trocar bone needle advanced to the posterior margin of the L5 vertebral body using a left transpedicular approach. This was exchanged over pin for the working needle, advanced into the posterior aspect of the L5 vertebral body. Needle position confirmed on biplane imaging. Coaxial core biopsy samples x2 obtained, placed in formalin. The access needle was removed. The patient tolerated the  procedure well. FLUOROSCOPY TIME:  2.5 minutes; 6712 uGym2 DAP COMPLICATIONS: None immediate. IMPRESSION: 1. Technically successful L5 vertebral body deep bone  core biopsy under fluoroscopy Electronically Signed   By: Lucrezia Europe M.D.   On: 12/22/2017 08:39    Labs:  CBC: Recent Labs    12/21/17 1026 12/23/17 1221  WBC 5.9 6.1  HGB 11.2* 11.9*  HCT 35.8* 36.7  PLT 531* 438*    COAGS: Recent Labs    12/21/17 1026  INR 1.12    BMP: Recent Labs    12/19/17 1903 12/23/17 1221  NA  --  138  K  --  4.0  CL  --  103  CO2  --  26  GLUCOSE  --  110*  BUN  --  13  CALCIUM  --  9.4  CREATININE 0.60 0.67  GFRNONAA  --  >60  GFRAA  --  >60    LIVER FUNCTION TESTS: No results for input(s): BILITOT, AST, ALT, ALKPHOS, PROT, ALBUMIN in the last 8760 hours.  Assessment and Plan: Pt with history of metastatic poorly differentiated cervical cancer and presents today for Port-A-Cath placement for chemotherapy. Risks and benefits of image guided port-a-catheter placement was discussed with the patient/family including, but not limited to bleeding, infection, pneumothorax, or fibrin sheath development and need for additional procedures.  All of the patient's questions were answered, patient is agreeable to proceed. Consent signed and in chart.     Electronically Signed: D. Rowe Robert, PA-C 12/23/2017, 1:04 PM   I spent a total of 25 minutes at the the patient's bedside AND on the patient's hospital floor or unit, greater than 50% of which was counseling/coordinating care for port a cath placement

## 2017-12-26 NOTE — Progress Notes (Signed)
[place] OFFICE PROGRESS NOTE  Patient Care Team: Antony Contras, MD as PCP - General (Family Medicine) Minus Breeding, MD as PCP - Cardiology (Cardiology)  HEME/ONC OVERVIEW: 1. Stage IV poorly differentiated cervical carcinoma -11/2017: gynecologic evaluation for vaginal spotting found an endocervical mass, bx-proven poorly differentiated carcinoma; evaluated by Dr. Denman George and felt surgical resection alone (mass >4cm) would be insufficient   -12/2017: PET showed FDG-avid lesion involving the cervix and uterus (favoring cervical origin extending into the uterus); no mesenteric or RP adenopathy; marked FDG-avid L5 vertebral body lesion with mixed lytic and sclerotic appearance; FDG-avid colon near the hepatic flexure, concerning for colon cancer -12/26/2017: CT-guided bx of L5 vertebral body; path showed metastatic poorly differentiated carcinoma; PD-L1 and MSI testing pending  -01/11/2018 (tentative) - present: weekly cisplatin 85m/m2 concurrent with RT; plan for 6 cycles   2. Port placed in 12/2017   ASSESSMENT & PLAN:   Stage IV poorly differentiated cervical carcinoma  -I reviewed the pathology report from CT-guided biopsy of L5 vertebral body, which unfortunately showed metastatic carcinoma -The case will be presented at the tumor board on 01/02/2018; I also discussed the case with Dr. RDenman Georgeof gynecologic oncology and Dr. KSondra Comeof radiation oncology -In light of Stage IV disease, hysterectomy is NOT recommended  -Given the oligometastatic disease and absence of significant comorbidities, the plan is to proceed with concurrent chemoradiation w/ weekly cisplatin (capturing the L5 vertebral body with RT), followed by interim PET, and then cisplatin/Taxol/Avastin x 6 cycles -We discussed some of the risks, benefits, side-effects of cisplatin. The intent is for palliation. -Some of the short term side-effects included, though not limited to, including weight loss, life  threatening infections, risk of allergic reactions, need for transfusions of blood products, nausea, vomiting, change in bowel habits, loss of hair, admission to hospital for various reasons, and risks of death.  -Long term side-effects are also discussed including risks of infertility, permanent damage to nerve function, hearing loss, chronic fatigue, kidney damage with possibility needing hemodialysis, and rare secondary malignancy including bone marrow disorders. -The patient is aware that the response rates discussed earlier is not guaranteed.  After a long discussion, patient made an informed decision to proceed with the prescribed plan of care. -Per discussion with Dr. 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  -Patient is currently asymptomatic at this time  Incidental colonic lesion near the hepatic flexure -The patient is scheduled for GI evaluation on 01/04/2018 -If colonoscopy shows second primary malignancy, the patient will warrant genetic evaluation to rule out any genetic syndrome  Goals of care discussion -I discussed I informed the patient that the goal of treatment is for palliative intent only, not curative -However, given the oligometastatic disease and absence of significant comorbidities, we will plan to pursue aggressive treatment, including chemoradiation and chemotherapy  Orders Placed This Encounter  Procedures  . Amb Referral to Nutrition and Diabetic Education (specifically to BErnestene Kiel    Referral Priority:   Routine    Referral Type:   Consultation    Referral Reason:   Specialty Services Required    Number of Visits Requested:   1  .  Ambulatory referral  to Audiology    Referral Priority:   Urgent    Referral Type:   Audiology Exam    Referral Reason:   Specialty Services Required    Number of Visits Requested:   1    All questions were answered. The patient knows to call the clinic with any problems, questions or concerns. No barriers to learning was detected.  A total of more than 40 minutes were spent face-to-face with the patient during this encounter and over half of that time was spent on counseling and coordination of care as outlined above.   Return on 01/10/2018 for labs, port flush, chemo education and clinic follow-up. Return on 01/11/2018 for the 1st dose of weekly cisplatin.   Tish Men, MD 12/28/2017 5:04 PM  CHIEF COMPLAINT: "I am here for my results "  INTERVAL HISTORY: Toni Peterson returns to clinic for follow-up of results from recent CT-guided biopsy of the bone.  She reports feeling well, and denies any fever, chill, night sweats, weight change, chest pain, dyspnea, abdominal pain, nausea/vomiting, diarrhea, or back pain.  SUMMARY OF ONCOLOGIC HISTORY:   Cervical cancer ([place])   09/08/2017 Initial Diagnosis    Patient has a history of symptomatic endometrial fibroids in 2013 for which she underwent hysteroscopic resection with benign pathology.  She has had lifelong normal Pap smears including in 2019.  Of note HPV testing was not performed in this Pap in 2019.  She developed symptoms of very light vaginal spotting in August 2019     11/22/2017 Procedure    She was seen by her gynecologist, Dr. Radene Knee, who performed a pelvic examination.  On physical examination and endocervical mass was appreciated and felt to be likely to be a polyp or fibroid was biopsied on 11/22/17.       11/22/2017 Pathology Results    This revealed poorly differentiated invasive carcinoma.  Immunostains revealed that it was CK7 positive, P 16+, p53 positive, PAX 8+ and CK 5 6 weakly positive.  CK 20, ER PR and p53 immunostains  were negative.  The morphology and Immuno profile favored a gynecologic primary with endocervical and endometrial adenocarcinoma in the differential diagnosis.     11/22/2017 Imaging    An ultrasound scan on November 22, 2017 revealed several intramural fibroids.  With saline infusion there was a 1.8 cm density within the endometrial cavity which could be a fibroid or polyp.     12/08/2017 Imaging    PET:  1. Marked hypermetabolism involving the cervix and uterus. Difficult to be certain of the origin but favor cervix. No evidence of parametrial or parauterine tumor and no pelvic or retroperitoneal hypermetabolic lymphadenopathy. 2. L5 vertebral body lesion worrisome for metastasis. 3. Hypermetabolic area involving the hepatic flexure region of the colon is worrisome for colon cancer. Recommend colonoscopy. 4. Benign left adrenal gland adenoma. 5. Symmetric hypermetabolism in the tongue base and tonsillar regions is likely inflamed lymphoid tissue. No neck mass or adenopathy.     REVIEW OF SYSTEMS:   Constitutional: ( - ) fevers, ( - )  chills , ( - ) night sweats Eyes: ( - ) blurriness of vision, ( - ) double vision, ( - ) watery eyes Ears, nose, mouth, throat, and face: ( - ) mucositis, ( - ) sore throat Respiratory: ( - ) cough, ( - ) dyspnea, ( - ) wheezes Cardiovascular: ( - ) palpitation, ( - ) chest discomfort, ( - ) lower extremity swelling Gastrointestinal:  ( - ) nausea, ( - )  heartburn, ( - ) change in bowel habits Skin: ( - ) abnormal skin rashes Lymphatics: ( - ) new lymphadenopathy, ( - ) easy bruising Neurological: ( - ) numbness, ( - ) tingling, ( - ) new weaknesses Behavioral/Psych: ( - ) mood change, ( - ) new changes  All other systems were reviewed with the patient and are negative.  I have reviewed the past medical history, past surgical history, social history and family history with the patient and they are unchanged from previous note.  ALLERGIES:  is allergic  to clarithromycin; dilacor [diltiazem hcl]; and norvasc [amlodipine besylate].  MEDICATIONS:  Current Outpatient Medications  Medication Sig Dispense Refill  . amLODipine (NORVASC) 10 MG tablet Take 10 mg by mouth daily.     Marland Kitchen atenolol (TENORMIN) 50 MG tablet Take 50 mg by mouth daily.  1  . Cholecalciferol (VITAMIN D3) 5000 units CAPS Take 5,000 Units by mouth daily.     . enalapril (VASOTEC) 20 MG tablet Take 20 mg by mouth daily.  1  . fish oil-omega-3 fatty acids 1000 MG capsule Take 1 g by mouth daily.     . Multiple Vitamin (MULTIVITAMIN WITH MINERALS) TABS tablet Take 1 tablet by mouth daily.    Marland Kitchen spironolactone (ALDACTONE) 25 MG tablet Take 1 tablet (25 mg total) by mouth daily. 90 tablet 3   No current facility-administered medications for this visit.     PHYSICAL EXAMINATION: ECOG PERFORMANCE STATUS: 0 - Asymptomatic  Today's Vitals   12/28/17 1428 12/28/17 1432  BP: 137/89   Pulse: 64   Resp: 16   Temp: 98.4 F (36.9 C)   TempSrc: Oral   SpO2: 100%   Weight: 182 lb 6.4 oz (82.7 kg)   Height: 5' 1" (1.549 m)   PainSc:  0-No pain   Body mass index is 34.46 kg/m.  Filed Weights   12/28/17 1428  Weight: 182 lb 6.4 oz (82.7 kg)    GENERAL: alert, no distress and comfortable SKIN: skin color, texture, turgor are normal, no rashes or significant lesions EYES: conjunctiva are pink and non-injected, sclera clear OROPHARYNX: no exudate, no erythema; lips, buccal mucosa, and tongue normal  NECK: supple, non-tender LYMPH:  no palpable lymphadenopathy in the cervical or axillary  LUNGS: clear to auscultation and percussion with normal breathing effort HEART: regular rate & rhythm and no murmurs and no lower extremity edema ABDOMEN: soft, non-tender, non-distended, normal bowel sounds Musculoskeletal: no cyanosis of digits and no clubbing  PSYCH: alert & oriented x 3, fluent speech NEURO: no focal motor/sensory deficits  LABORATORY DATA:  I have reviewed the data as  listed    Component Value Date/Time   NA 139 12/28/2017 1333   K 4.0 12/28/2017 1333   CL 105 12/28/2017 1333   CO2 26 12/28/2017 1333   GLUCOSE 102 (H) 12/28/2017 1333   BUN 16 12/28/2017 1333   CREATININE 0.70 12/28/2017 1333   CALCIUM 9.7 12/28/2017 1333   PROT 7.5 12/28/2017 1333   ALBUMIN 3.7 12/28/2017 1333   AST 12 (L) 12/28/2017 1333   ALT 13 12/28/2017 1333   ALKPHOS 87 12/28/2017 1333   BILITOT 0.4 12/28/2017 1333   GFRNONAA >60 12/28/2017 1333   GFRAA >60 12/28/2017 1333    No results found for: SPEP, UPEP  Lab Results  Component Value Date   WBC 5.3 12/28/2017   NEUTROABS 2.7 12/28/2017   HGB 10.9 (L) 12/28/2017   HCT 33.2 (L) 12/28/2017   MCV 90.0 12/28/2017  PLT 374 12/28/2017      Chemistry      Component Value Date/Time   NA 139 12/28/2017 1333   K 4.0 12/28/2017 1333   CL 105 12/28/2017 1333   CO2 26 12/28/2017 1333   BUN 16 12/28/2017 1333   CREATININE 0.70 12/28/2017 1333      Component Value Date/Time   CALCIUM 9.7 12/28/2017 1333   ALKPHOS 87 12/28/2017 1333   AST 12 (L) 12/28/2017 1333   ALT 13 12/28/2017 1333   BILITOT 0.4 12/28/2017 1333       RADIOGRAPHIC STUDIES: I have personally reviewed the radiological images as listed below and agreed with the findings in the report. Mr Lumbar Spine W Wo Contrast  Result Date: 12/20/2017 CLINICAL DATA:  Hypermetabolic L5 vertebral lesion on PET-CT. MRI for biopsy planning. History of cervical cancer. EXAM: MRI LUMBAR SPINE WITHOUT AND WITH CONTRAST TECHNIQUE: Multiplanar and multiecho pulse sequences of the lumbar spine were obtained without and with intravenous contrast. CONTRAST:  9 mL Gadavist COMPARISON:  PET/CT 12/08/2017 FINDINGS: Segmentation:  Standard. Alignment: Facet mediated anterolisthesis of L4 on L5 measuring 5 mm. Vertebrae: There is diffuse replacement of normal marrow signal in the L5 vertebral body with heterogeneous STIR hyperintensity and heterogeneously intense enhancement  throughout. No vertebral body height loss or extraosseous tumor is evident. Bilateral posterior element edema may be degenerative in nature given advanced L4-5 facet arthritis. A 12 mm T1 and T2 hypointense focus anteriorly in the T12 vertebral body corresponds to an area of sclerosis on CT and was without significant hypermetabolism reported on PET. There is a small amount of surrounding STIR hyperintensity and enhancement, and there is a similar but smaller area along the T11 inferior endplate measuring 8 mm. The T11 and T12 lesions are associated with anterior vertebral osteophyte formation and are favored to be degenerative. Conus medullaris and cauda equina: Conus extends to the L1 level. Conus and cauda equina appear normal. Paraspinal and other soft tissues: Partial visualization of multiple uterine masses as well as of a mixed cystic and solid right adnexal mass posterior to the uterus measuring approximately 4.1 x 2.7 cm. Disc levels: Disc desiccation throughout the lumbar and lower thoracic spine. Severe disc space narrowing anteriorly at T11-12 with moderate narrowing at T10-11 and L4-5. T11-12: Only imaged sagittally. Disc bulging without evidence of significant stenosis or spinal cord mass effect. T12-L1: Negative. L1-2: Negative. L2-3: Minimal disc bulging without stenosis. L3-4: Minimal disc bulging without stenosis. L4-5: Anterolisthesis with bulging uncovered disc, mild ligamentum flavum hypertrophy, and severe facet arthrosis result in mild bilateral lateral recess stenosis and mild-to-moderate bilateral neural foraminal stenosis. The facet joints are gaping with large effusions which can be seen in the setting of instability. Small subchondral cysts are associated with the right facet joint, and there is a small synovial cyst posterior to the right facet joint. L5-S1: Circumferential disc bulging and moderate facet arthrosis result in moderate bilateral neural foraminal stenosis without spinal  stenosis. There are small facet joint effusions. IMPRESSION: 1. Diffuse marrow replacement in the L5 vertebral body, indeterminate though metastatic disease is a concern. Correlate with upcoming biopsy. No extraosseous tumor or pathologic fracture. 2. Marrow signal abnormality on both sides of the T11-12 disc space favored to be degenerative. 3. Severe L4-5 facet arthrosis with grade 1 anterolisthesis, mild bilateral lateral recess stenosis, and mild-to-moderate bilateral neural foraminal stenosis. Findings may worsen with standing. 4. Moderate bilateral neural foraminal stenosis at L5-S1. 5. Partially visualized uterine masses which may reflect  a combination of known adenocarcinoma and fibroids. 4 cm right adnexal mass may reflect an exophytic subserosal fibroid or ovarian neoplasm. Electronically Signed   By: Logan Bores M.D.   On: 12/20/2017 09:09   Nm Pet Image Initial (pi) Skull Base To Thigh  Result Date: 12/09/2017 CLINICAL DATA:  Initial treatment strategy for cervical cancer. EXAM: NUCLEAR MEDICINE PET SKULL BASE TO THIGH TECHNIQUE: 9.5 mCi F-18 FDG was injected intravenously. Full-ring PET imaging was performed from the skull base to thigh after the radiotracer. CT data was obtained and used for attenuation correction and anatomic localization. Fasting blood glucose: 96 mg/dl COMPARISON:  None. FINDINGS: Mediastinal blood pool activity: SUV max 2.91 NECK: Symmetric hypermetabolism noted in the base of the tongue and tonsillar regions, consistent with lymphoid tissue which may be due to upper respiratory infection. There is no mass on the CT scan and no adenopathy in the neck. Incidental CT findings: Enlarged multinodular thyroid gland. No worrisome hypermetabolism. CHEST: No enlarged or hypermetabolic mediastinal or hilar lymph nodes. No definite pulmonary nodules. No breast masses, supraclavicular or axillary adenopathy. Incidental CT findings: none ABDOMEN/PELVIS: Focal area of fairly marked  hypermetabolism noted in the hepatic flexure region of the colon with some associated soft tissue density on the CT scan. SUV max is 16.55. Recommend colonoscopy for further evaluation and to exclude colon cancer. No worrisome hypermetabolic hepatic lesions. Small cysts are noted. There is a 16 mm left adrenal gland nodule which measures 1 Hounsfield units and is consistent with a benign adenoma. It is mildly hypermetabolic which is not atypical. SUV max is 5.0. The uterus and cervix are markedly hypermetabolic with SUV max of 14.43. A large left-sided uterine fibroid is noted. The epicenter appears to be the cervix and I would favor this being cervical cancer that has extended up into the uterus. No pelvic sidewall or locoregional lymphadenopathy. No hydronephrosis. No mesenteric or retroperitoneal lymphadenopathy. Incidental CT findings: Moderate distal aortic and iliac artery atherosclerotic calcifications. SKELETON: There is a hypermetabolic lesion involving the L5 vertebral body. SUV max is 12.04. Mixed lytic and sclerotic appearance on the CT scan. I do not see any other definite bone metastasis. Incidental CT findings: Advanced facet disease in the lower lumbar spine. IMPRESSION: 1. Marked hypermetabolism involving the cervix and uterus. Difficult to be certain of the origin but favor cervix. No evidence of parametrial or parauterine tumor and no pelvic or retroperitoneal hypermetabolic lymphadenopathy. 2. L5 vertebral body lesion worrisome for metastasis. 3. Hypermetabolic area involving the hepatic flexure region of the colon is worrisome for colon cancer. Recommend colonoscopy. 4. Benign left adrenal gland adenoma. 5. Symmetric hypermetabolism in the tongue base and tonsillar regions is likely inflamed lymphoid tissue. No neck mass or adenopathy. Electronically Signed   By: Marijo Sanes M.D.   On: 12/09/2017 09:18   Ir Fluoro Guide Ndl Plmt / Bx  Result Date: 12/22/2017 CLINICAL DATA:  History of  cervical carcinoma. Hypermetabolic L5 vertebral body lesion. EXAM: DEEP CORE L5 VERTEBRAL BODY BONE UNDER FLUOROSCOPY MEDICATIONS: None indicated ANESTHESIA/SEDATION: Intravenous Fentanyl and Versed were administered as conscious sedation during continuous monitoring of the patient's level of consciousness and physiological / cardiorespiratory status by the radiology RN, with a total moderate sedation time of 22 minutes. PROCEDURE: Informed written consent was obtained from the patient after a thorough discussion of the procedural risks, benefits and alternatives. All questions were addressed. Maximal Sterile Barrier Technique was utilized including caps, mask, sterile gowns, sterile gloves, sterile drape, hand hygiene and skin  antiseptic. A timeout was performed prior to the initiation of the procedure. Patient placed prone. Lumbar region prepped with Betadine, draped in usual sterile fashion. After local infiltration with 1% lidocaine, a Kyphon trocar bone needle advanced to the posterior margin of the L5 vertebral body using a left transpedicular approach. This was exchanged over pin for the working needle, advanced into the posterior aspect of the L5 vertebral body. Needle position confirmed on biplane imaging. Coaxial core biopsy samples x2 obtained, placed in formalin. The access needle was removed. The patient tolerated the procedure well. FLUOROSCOPY TIME:  2.5 minutes; [id number] [id number] DAP COMPLICATIONS: None immediate. IMPRESSION: 1. Technically successful L5 vertebral body deep bone core biopsy under fluoroscopy Electronically Signed   By: Lucrezia Europe M.D.   On: 12/22/2017 08:39   Ir Imaging Guided Port Insertion  Result Date: 12/23/2017 INDICATION: 67 year old female with cervical cancer in need of durable venous access for chemotherapy. EXAM: IMPLANTED PORT A CATH PLACEMENT WITH ULTRASOUND AND FLUOROSCOPIC GUIDANCE MEDICATIONS: 2 g Ancef; The antibiotic was administered within an appropriate time interval  prior to skin puncture. ANESTHESIA/SEDATION: Versed 2 mg IV; Fentanyl 100 mcg IV; Moderate Sedation Time:  21 minutes The patient was continuously monitored during the procedure by the interventional radiology nurse under my direct supervision. FLUOROSCOPY TIME:  0 minutes, 18 seconds (4 mGy) COMPLICATIONS: None immediate. PROCEDURE: The right neck and chest was prepped with chlorhexidine, and draped in the usual sterile fashion using maximum barrier technique (cap and mask, sterile gown, sterile gloves, large sterile sheet, hand hygiene and cutaneous antiseptic). Antibiotic prophylaxis was provided with 2g Ancef administered IV one hour prior to skin incision. Local anesthesia was attained by infiltration with 1% lidocaine with epinephrine. Ultrasound demonstrated patency of the right internal jugular vein, and this was documented with an image. Under real-time ultrasound guidance, this vein was accessed with a 21 gauge micropuncture needle and image documentation was performed. A small dermatotomy was made at the access site with an 11 scalpel. A 0.018" wire was advanced into the SVC and the access needle exchanged for a 68F micropuncture vascular sheath. The 0.018" wire was then removed and a 0.035" wire advanced into the IVC. An appropriate location for the subcutaneous reservoir was selected below the clavicle and an incision was made through the skin and underlying soft tissues. The subcutaneous tissues were then dissected using a combination of blunt and sharp surgical technique and a pocket was formed. A single lumen power injectable portacatheter was then tunneled through the subcutaneous tissues from the pocket to the dermatotomy and the port reservoir placed within the subcutaneous pocket. The venous access site was then serially dilated and a peel away vascular sheath placed over the wire. The wire was removed and the port catheter advanced into position under fluoroscopic guidance. The catheter tip is  positioned in the upper right atrium. This was documented with a spot image. The portacatheter was then tested and found to flush and aspirate well. The port was flushed with saline followed by 100 units/mL heparinized saline. The pocket was then closed in two layers using first subdermal inverted interrupted absorbable sutures followed by a running subcuticular suture. The epidermis was then sealed with Dermabond. The dermatotomy at the venous access site was also closed with a single inverted subdermal suture and the epidermis sealed with Dermabond. IMPRESSION: Successful placement of a right IJ approach Power Port with ultrasound and fluoroscopic guidance. The catheter is ready for use. Electronically Signed   By: Jacqulynn Cadet  M.D.   On: 12/23/2017 14:47

## 2017-12-28 ENCOUNTER — Inpatient Hospital Stay: Payer: BC Managed Care – PPO

## 2017-12-28 ENCOUNTER — Telehealth: Payer: Self-pay | Admitting: Hematology

## 2017-12-28 ENCOUNTER — Inpatient Hospital Stay (HOSPITAL_BASED_OUTPATIENT_CLINIC_OR_DEPARTMENT_OTHER): Payer: BC Managed Care – PPO | Admitting: Hematology

## 2017-12-28 ENCOUNTER — Encounter: Payer: Self-pay | Admitting: Hematology

## 2017-12-28 VITALS — BP 137/89 | HR 64 | Temp 98.4°F | Resp 16 | Ht 61.0 in | Wt 182.4 lb

## 2017-12-28 DIAGNOSIS — R933 Abnormal findings on diagnostic imaging of other parts of digestive tract: Secondary | ICD-10-CM

## 2017-12-28 DIAGNOSIS — C539 Malignant neoplasm of cervix uteri, unspecified: Secondary | ICD-10-CM

## 2017-12-28 DIAGNOSIS — C7951 Secondary malignant neoplasm of bone: Secondary | ICD-10-CM | POA: Insufficient documentation

## 2017-12-28 DIAGNOSIS — Z7189 Other specified counseling: Secondary | ICD-10-CM

## 2017-12-28 DIAGNOSIS — Z95828 Presence of other vascular implants and grafts: Secondary | ICD-10-CM

## 2017-12-28 DIAGNOSIS — Z79899 Other long term (current) drug therapy: Secondary | ICD-10-CM

## 2017-12-28 DIAGNOSIS — K639 Disease of intestine, unspecified: Secondary | ICD-10-CM | POA: Insufficient documentation

## 2017-12-28 DIAGNOSIS — C530 Malignant neoplasm of endocervix: Secondary | ICD-10-CM

## 2017-12-28 LAB — CMP (CANCER CENTER ONLY)
ALT: 13 U/L (ref 0–44)
AST: 12 U/L — ABNORMAL LOW (ref 15–41)
Albumin: 3.7 g/dL (ref 3.5–5.0)
Alkaline Phosphatase: 87 U/L (ref 38–126)
Anion gap: 8 (ref 5–15)
BUN: 16 mg/dL (ref 8–23)
CALCIUM: 9.7 mg/dL (ref 8.9–10.3)
CHLORIDE: 105 mmol/L (ref 98–111)
CO2: 26 mmol/L (ref 22–32)
Creatinine: 0.7 mg/dL (ref 0.44–1.00)
Glucose, Bld: 102 mg/dL — ABNORMAL HIGH (ref 70–99)
Potassium: 4 mmol/L (ref 3.5–5.1)
Sodium: 139 mmol/L (ref 135–145)
Total Bilirubin: 0.4 mg/dL (ref 0.3–1.2)
Total Protein: 7.5 g/dL (ref 6.5–8.1)

## 2017-12-28 LAB — CBC WITH DIFFERENTIAL (CANCER CENTER ONLY)
ABS IMMATURE GRANULOCYTES: 0.02 10*3/uL (ref 0.00–0.07)
Basophils Absolute: 0.1 10*3/uL (ref 0.0–0.1)
Basophils Relative: 1 %
Eosinophils Absolute: 0.1 10*3/uL (ref 0.0–0.5)
Eosinophils Relative: 2 %
HCT: 33.2 % — ABNORMAL LOW (ref 36.0–46.0)
Hemoglobin: 10.9 g/dL — ABNORMAL LOW (ref 12.0–15.0)
Immature Granulocytes: 0 %
LYMPHS ABS: 2 10*3/uL (ref 0.7–4.0)
Lymphocytes Relative: 38 %
MCH: 29.5 pg (ref 26.0–34.0)
MCHC: 32.8 g/dL (ref 30.0–36.0)
MCV: 90 fL (ref 80.0–100.0)
Monocytes Absolute: 0.4 10*3/uL (ref 0.1–1.0)
Monocytes Relative: 8 %
NEUTROS ABS: 2.7 10*3/uL (ref 1.7–7.7)
NEUTROS PCT: 51 %
Platelet Count: 374 10*3/uL (ref 150–400)
RBC: 3.69 MIL/uL — ABNORMAL LOW (ref 3.87–5.11)
RDW: 14.6 % (ref 11.5–15.5)
WBC Count: 5.3 10*3/uL (ref 4.0–10.5)
nRBC: 0 % (ref 0.0–0.2)

## 2017-12-28 LAB — MAGNESIUM: MAGNESIUM: 2 mg/dL (ref 1.7–2.4)

## 2017-12-28 MED ORDER — ONDANSETRON HCL 8 MG PO TABS: 8.0000 mg | ORAL_TABLET | Freq: Two times a day (BID) | ORAL | 1 refills | Status: DC | PRN

## 2017-12-28 MED ORDER — LORAZEPAM 0.5 MG PO TABS: 0.5000 mg | ORAL_TABLET | Freq: Four times a day (QID) | ORAL | 0 refills | Status: DC | PRN

## 2017-12-28 MED ORDER — DEXAMETHASONE 4 MG PO TABS: ORAL_TABLET | ORAL | 1 refills | Status: DC

## 2017-12-28 MED ORDER — PROCHLORPERAZINE MALEATE 10 MG PO TABS: 10.0000 mg | ORAL_TABLET | Freq: Four times a day (QID) | ORAL | 1 refills | Status: DC | PRN

## 2017-12-28 MED ORDER — SODIUM CHLORIDE 0.9% FLUSH
10.0000 mL | INTRAVENOUS | Status: DC | PRN
  Administered 2017-12-28: 10 mL via INTRAVENOUS
  Filled 2017-12-28: qty 10

## 2017-12-28 MED ORDER — LIDOCAINE-PRILOCAINE 2.5-2.5 % EX CREA: TOPICAL_CREAM | CUTANEOUS | 3 refills | Status: AC

## 2017-12-28 MED ORDER — HEPARIN SOD (PORK) LOCK FLUSH 100 UNIT/ML IV SOLN
500.0000 [IU] | Freq: Once | INTRAVENOUS | Status: AC
  Administered 2017-12-28: 500 [IU] via INTRAVENOUS
  Filled 2017-12-28: qty 5

## 2017-12-28 NOTE — Telephone Encounter (Signed)
Gave pt avs and calendar  °

## 2017-12-28 NOTE — Progress Notes (Signed)
START OFF PATHWAY REGIMEN - [Other Dx]   [id number]:Cisplatin 40 mg/m2 IV D1 q7 Days + RT:   A cycle is every 7 days:     Cisplatin   **Always confirm dose/schedule in your pharmacy ordering system**  Patient Characteristics: Intent of Therapy: Non-Curative / Palliative Intent, Discussed with Patient

## 2017-12-29 ENCOUNTER — Telehealth: Payer: Self-pay | Admitting: Oncology

## 2017-12-29 ENCOUNTER — Other Ambulatory Visit: Payer: Self-pay | Admitting: Hematology

## 2017-12-29 NOTE — Telephone Encounter (Signed)
Toni Peterson called and said she forgot what chemotherapy medication that Dr. Maylon Peppers had mentioned for her treatment.  Advised her that it is cisplatin.  She verbalized understanding.

## 2017-12-29 NOTE — Telephone Encounter (Signed)
Toni Peterson called and said to fax her FMLA and [employer] paperwork to [phone] attn: [name].

## 2018-01-02 ENCOUNTER — Encounter: Payer: Self-pay | Admitting: Oncology

## 2018-01-02 ENCOUNTER — Other Ambulatory Visit (HOSPITAL_COMMUNITY)
Admission: RE | Admit: 2018-01-02 | Discharge: 2018-01-02 | Disposition: A | Discharge status: Home / Self Care | Payer: BC Managed Care – PPO | Source: Ambulatory Visit | Attending: Gynecologic Oncology | Admitting: Gynecologic Oncology

## 2018-01-02 ENCOUNTER — Telehealth: Payer: Self-pay | Admitting: Oncology

## 2018-01-02 DIAGNOSIS — C7951 Secondary malignant neoplasm of bone: Secondary | ICD-10-CM | POA: Insufficient documentation

## 2018-01-02 DIAGNOSIS — C801 Malignant (primary) neoplasm, unspecified: Secondary | ICD-10-CM | POA: Insufficient documentation

## 2018-01-02 NOTE — Telephone Encounter (Signed)
Emina called to check on the status of her FMLA paperwork.  Left a message for Otilio Carpen, Development worker, community in [place].

## 2018-01-02 NOTE — Progress Notes (Signed)
Gynecologic Oncology Multi-Disciplinary Disposition Conference Note  Date of the Conference: 01/02/2018  Patient Name: Toni Peterson  Referring Provider: Primary GYN Oncologist:  Stage/Disposition: Stage IV endometrial vs endocervical cancer. Disposition is to chemoradiation, then reimaging with PET followed by 6 cycles of chemotherapy with reimaging after 3 cycles..   This Multidisciplinary conference took place involving physicians from [place], Medical Oncology, Radiation Oncology, Pathology, Radiology along with the Gynecologic Oncology Nurse Practitioner and RN.  Comprehensive assessment of the patient's malignancy, staging, need for surgery, chemotherapy, radiation therapy, and need for further testing were reviewed. Supportive measures, both inpatient and following discharge were also discussed. The recommended plan of care is documented. Greater than 35 minutes were spent correlating and coordinating this patient's care.

## 2018-01-03 ENCOUNTER — Telehealth: Payer: Self-pay | Admitting: Oncology

## 2018-01-03 NOTE — Telephone Encounter (Signed)
Faxed FMLA paperwork to [facility] at [phone].  Also called Euna and left her a message notifying her that it has been sent.

## 2018-01-04 ENCOUNTER — Encounter: Payer: Self-pay | Admitting: Gastroenterology

## 2018-01-04 ENCOUNTER — Ambulatory Visit: Payer: BC Managed Care – PPO | Admitting: Gastroenterology

## 2018-01-04 VITALS — BP 134/80 | HR 64 | Ht 61.5 in | Wt 182.1 lb

## 2018-01-04 DIAGNOSIS — R9389 Abnormal findings on diagnostic imaging of other specified body structures: Secondary | ICD-10-CM

## 2018-01-04 MED ORDER — SOD PICOSULFATE-MAG OX-CIT ACD 10-3.5-12 MG-GM -GM/160ML PO SOLN: 1.0000 | ORAL | 0 refills | Status: DC

## 2018-01-04 NOTE — Progress Notes (Signed)
Chief Complaint: Abnormal PET/CT   Referring Provider:     Tish Men, MD    HPI:     Toni Peterson is a 67 y.o. 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  Recent PET showed FDG avid lesion in L5 vertebral body, with subsequent CT-guided biopsy demonstrating metastatic poorly differentiated carcinoma.  PET also with FDG avid lesion in the colon near the hepatic flexure, concerning for colon cancer.  Port placed earlier this month in preparation for upcoming chemotherapy.  Today she states she is otherwise without GI symptoms, to include hematochezia, melena, nausea, vomiting, abdominal pain.  No known family history of colon cancer or GI malignancy.  She is scheduled to start chemotherapy next week.  Initial colonoscopy was completed approximately 6 years ago in [facility], and normal to the best of her knowledge with recommendation to repeat in 10 years.   Recent labs notable for normal CMP, normal WBC and platelets with H/H 10.9/33.2, normal INR.   Past Medical History:  Diagnosis Date  . Cancer ([place])    Cervical  . Diabetes ([place])   . Hyperlipidemia   . Hypertension   . Other abnormal glucose   . PONV (postoperative nausea and vomiting)    slow awakening after dental visit     Past Surgical History:  Procedure Laterality Date  . ADENOIDECTOMY    . COLONOSCOPY  2013   said it was in [place]   . DILATION AND CURETTAGE OF UTERUS    . DILATION AND CURETTAGE OF UTERUS  03/11/2011   Procedure: DILATATION AND CURETTAGE;  Surgeon: Darlyn Chamber, MD;  Location: [facility];  Service: Gynecology;  Laterality: N/A;  . HYSTEROSCOPY W/D&C  09-02-2006   ENDOMETRIAL BX'S  . HYSTEROSCOPY WITH  RESECTOSCOPE  03/11/2011   Procedure: HYSTEROSCOPY WITH RESECTOSCOPE;  Surgeon: Darlyn Chamber, MD;  Location: [facility];  Service: Gynecology;  Laterality: N/A;  . IR FLUORO GUIDED NEEDLE PLC ASPIRATION/INJECTION LOC  12/21/2017  . IR IMAGING GUIDED PORT INSERTION  12/23/2017   Family History  Problem Relation Age of Onset  . Stroke Brother        Died 44  . Heart attack Mother        Vague  . Hypertension Mother   . Hyperlipidemia Other   . Hypertension Other   . Hypertension Father   . Cancer Paternal Aunt        breast ca  . Cancer Cousin        breast ca  . Colon cancer Neg Hx   . Esophageal cancer Neg Hx    Social History   Tobacco Use  . Smoking status: Former Research scientist (life sciences)  . Smokeless tobacco: Never Used  . Tobacco comment: quit 20 years ago  Substance Use Topics  . Alcohol use: No  . Drug use: No   Current Outpatient Medications  Medication Sig Dispense Refill  . amLODipine (NORVASC) 10 MG tablet Take 10 mg by mouth daily.     Marland Kitchen atenolol (TENORMIN) 50 MG tablet Take 50 mg by mouth daily.  1  . Cholecalciferol (VITAMIN D3) 5000 units CAPS Take 5,000 Units by mouth daily.     Marland Kitchen dexamethasone (  DECADRON) 4 MG tablet Take 2 tablets by mouth once a day on the day after chemotherapy and then take 2 tablets two times a day for 2 days. Take with food. 30 tablet 1  . enalapril (VASOTEC) 20 MG tablet Take 20 mg by mouth daily.  1  . fish oil-omega-3 fatty acids 1000 MG capsule Take 1 g by mouth daily.     Marland Kitchen lidocaine-prilocaine (EMLA) cream Apply to affected area once 30 g 3  . LORazepam (ATIVAN) 0.5 MG tablet Take 1 tablet (0.5 mg total) by mouth every 6 (six) hours as needed (Nausea or vomiting). 30 tablet 0  . Multiple Vitamin (MULTIVITAMIN WITH MINERALS) TABS tablet Take 1 tablet by mouth daily.    . ondansetron (ZOFRAN) 8 MG tablet Take 1 tablet (8 mg total) by mouth 2 (two) times daily as needed. Start on the third day after chemotherapy. 30 tablet 1  .  prochlorperazine (COMPAZINE) 10 MG tablet Take 1 tablet (10 mg total) by mouth every 6 (six) hours as needed (Nausea or vomiting). 30 tablet 1  . spironolactone (ALDACTONE) 25 MG tablet Take 1 tablet (25 mg total) by mouth daily. 90 tablet 3   No current facility-administered medications for this visit.    Allergies  Allergen Reactions  . Clarithromycin   . Dilacor [Diltiazem Hcl]   . Norvasc [Amlodipine Besylate]      Review of Systems: All systems reviewed and negative except where noted in HPI.     Physical Exam:    Wt Readings from Last 3 Encounters:  01/04/18 182 lb 2 oz (82.6 kg)  12/28/17 182 lb 6.4 oz (82.7 kg)  12/21/17 184 lb (83.5 kg)    BP 134/80   Pulse 64   Ht 5' 1.5" (1.562 m)   Wt 182 lb 2 oz (82.6 kg)   BMI 33.85 kg/m  Constitutional:  Pleasant, in no acute distress. Psychiatric: Normal mood and affect. Behavior is normal. EENT: Pupils normal.  Conjunctivae are normal. No scleral icterus. Neck supple. No cervical LAD. Cardiovascular: 3/6 SEM. Normal rate, regular rhythm. No edema Pulmonary/chest: Effort normal and breath sounds normal. No wheezing, rales or rhonchi. Abdominal: Soft, nondistended, nontender. Bowel sounds active throughout. There are no masses palpable. No hepatomegaly. Neurological: Alert and oriented to person place and time. Skin: Skin is warm and dry. No rashes noted.   ASSESSMENT AND PLAN;   Toni Peterson is a 67 y.o. 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  There is an additional FDG avid lesion in the spine with subsequent biopsy demonstrating metastatic disease.  Discussed the PET CT at length today, of which she was fully aware, and will proceed as below:  - Expedited colonoscopy early next week.  Will complete prior to induction of chemotherapy. - If colonoscopy notable for colon cancer,  agree with oncology notes that genetic testing would be pertinent.  The indications, risks, and benefits of colonoscopy were explained to the patient in detail. Risks include but are not limited to bleeding, perforation, adverse reaction to medications, and cardiopulmonary compromise. Sequelae include but are not limited to the possibility of surgery, hospitalization, and mortality. The patient verbalized understanding and wished to proceed. All questions answered, referred to the scheduler and bowel prep ordered. Further recommendations pending results of the exam.     Lavena Bullion, DO, FACG  01/04/2018, 10:01 AM  CC: Antony Contras,  MD Tish Men, MD

## 2018-01-04 NOTE — Patient Instructions (Signed)
If you are age 65 or older, your body mass index should be between 23-30. Your Body mass index is 33.85 kg/m. If this is out of the aforementioned range listed, please consider follow up with your Primary Care Provider.  If you are age 46 or younger, your body mass index should be between 19-25. Your Body mass index is 33.85 kg/m. If this is out of the aformentioned range listed, please consider follow up with your Primary Care Provider.   You have been scheduled for a colonoscopy. Please follow written instructions given to you at your visit today.  Please pick up your prep supplies at the pharmacy within the next 1-3 days. If you use inhalers (even only as needed), please bring them with you on the day of your procedure. Your physician has requested that you go to www.startemmi.com and enter the access code given to you at your visit today. This web site gives a general overview about your procedure. However, you should still follow specific instructions given to you by our office regarding your preparation for the procedure.  It was a pleasure to see you today!  Vito Cirigliano, D.O.

## 2018-01-08 DIAGNOSIS — K649 Unspecified hemorrhoids: Secondary | ICD-10-CM

## 2018-01-08 DIAGNOSIS — K5790 Diverticulosis of intestine, part unspecified, without perforation or abscess without bleeding: Secondary | ICD-10-CM

## 2018-01-08 HISTORY — DX: Diverticulosis of intestine, part unspecified, without perforation or abscess without bleeding: K57.90

## 2018-01-08 HISTORY — DX: Unspecified hemorrhoids: K64.9

## 2018-01-08 HISTORY — PX: COLONOSCOPY: SHX174

## 2018-01-09 ENCOUNTER — Ambulatory Visit
Admission: RE | Admit: 2018-01-09 | Discharge: 2018-01-09 | Disposition: A | Discharge status: Home / Self Care | Payer: BC Managed Care – PPO | Source: Ambulatory Visit | Attending: Radiation Oncology | Admitting: Radiation Oncology

## 2018-01-09 ENCOUNTER — Encounter: Payer: Self-pay | Admitting: Oncology

## 2018-01-09 DIAGNOSIS — Z5111 Encounter for antineoplastic chemotherapy: Secondary | ICD-10-CM | POA: Diagnosis not present

## 2018-01-09 DIAGNOSIS — C530 Malignant neoplasm of endocervix: Secondary | ICD-10-CM

## 2018-01-09 DIAGNOSIS — C189 Malignant neoplasm of colon, unspecified: Secondary | ICD-10-CM | POA: Diagnosis not present

## 2018-01-09 DIAGNOSIS — K649 Unspecified hemorrhoids: Secondary | ICD-10-CM | POA: Diagnosis not present

## 2018-01-09 DIAGNOSIS — Z510 Encounter for antineoplastic radiation therapy: Secondary | ICD-10-CM | POA: Diagnosis not present

## 2018-01-09 DIAGNOSIS — C539 Malignant neoplasm of cervix uteri, unspecified: Secondary | ICD-10-CM

## 2018-01-09 DIAGNOSIS — C7951 Secondary malignant neoplasm of bone: Secondary | ICD-10-CM | POA: Diagnosis not present

## 2018-01-09 DIAGNOSIS — I10 Essential (primary) hypertension: Secondary | ICD-10-CM | POA: Diagnosis not present

## 2018-01-09 DIAGNOSIS — D6481 Anemia due to antineoplastic chemotherapy: Secondary | ICD-10-CM | POA: Diagnosis not present

## 2018-01-09 DIAGNOSIS — Z452 Encounter for adjustment and management of vascular access device: Secondary | ICD-10-CM | POA: Diagnosis not present

## 2018-01-09 NOTE — Progress Notes (Signed)
  Radiation Oncology         [phone]) [phone] ________________________________  Name: Toni Peterson MRN: [id number]  Date: 01/09/2018  DOB: August 09, 1950  SIMULATION AND TREATMENT PLANNING NOTE    ICD-10-CM   1. Malignant neoplasm of cervix, unspecified site [facility]) C53.9     DIAGNOSIS:  stage IV poorly differentiated cervical carcinoma   NARRATIVE:  The patient was brought to the [facility].  Identity was confirmed.  All relevant records and images related to the planned course of therapy were reviewed.  The patient freely provided informed written consent to proceed with treatment after reviewing the details related to the planned course of therapy. The consent form was witnessed and verified by the simulation staff.  Then, the patient was set-up in a stable reproducible  supine position for radiation therapy.  CT images were obtained.  Surface markings were placed.  The CT images were loaded into the planning software.  Then the target and avoidance structures were contoured.  Treatment planning then occurred.  The radiation prescription was entered and confirmed.  Then, I designed and supervised the construction of a total of 5 medically necessary complex treatment devices.  I have requested : 3D Simulation  I have requested a DVH of the following structures: GTV, bladder, rectum cervix, bowel.  I have ordered:dose calc.  PLAN:  The patient will receive 45 Gy in 25 fractions directed at the pelvis area including the metastasis at L5. The patient will also receive radiosensitizing chemotherapy. She will then proceed with 5 intracavitary brachytherapy treatments.  Special Treatment Procedure Note: The patient will be receiving radiosensitizing chemotherapy. Given the potential of increased toxicities related to combined therapy and the necessity for close monitoring of the patient and blood work, this constitutes a special treatment  procedure.  -----------------------------------  Blair Promise, PhD, MD  .Kathleen Argue

## 2018-01-10 ENCOUNTER — Inpatient Hospital Stay: Payer: BC Managed Care – PPO

## 2018-01-10 ENCOUNTER — Inpatient Hospital Stay (HOSPITAL_BASED_OUTPATIENT_CLINIC_OR_DEPARTMENT_OTHER): Payer: BC Managed Care – PPO | Admitting: Hematology and Oncology

## 2018-01-10 ENCOUNTER — Encounter: Payer: Self-pay | Admitting: Hematology and Oncology

## 2018-01-10 ENCOUNTER — Inpatient Hospital Stay: Payer: BC Managed Care – PPO | Attending: Hematology and Oncology

## 2018-01-10 VITALS — BP 164/82 | HR 67 | Temp 98.5°F | Resp 18 | Ht 61.5 in | Wt 179.0 lb

## 2018-01-10 DIAGNOSIS — C539 Malignant neoplasm of cervix uteri, unspecified: Secondary | ICD-10-CM | POA: Insufficient documentation

## 2018-01-10 DIAGNOSIS — C189 Malignant neoplasm of colon, unspecified: Secondary | ICD-10-CM | POA: Insufficient documentation

## 2018-01-10 DIAGNOSIS — C7951 Secondary malignant neoplasm of bone: Secondary | ICD-10-CM | POA: Insufficient documentation

## 2018-01-10 DIAGNOSIS — Z5111 Encounter for antineoplastic chemotherapy: Secondary | ICD-10-CM | POA: Diagnosis not present

## 2018-01-10 DIAGNOSIS — Z95828 Presence of other vascular implants and grafts: Secondary | ICD-10-CM

## 2018-01-10 DIAGNOSIS — K649 Unspecified hemorrhoids: Secondary | ICD-10-CM | POA: Insufficient documentation

## 2018-01-10 DIAGNOSIS — Z510 Encounter for antineoplastic radiation therapy: Secondary | ICD-10-CM | POA: Insufficient documentation

## 2018-01-10 DIAGNOSIS — D6481 Anemia due to antineoplastic chemotherapy: Secondary | ICD-10-CM | POA: Insufficient documentation

## 2018-01-10 DIAGNOSIS — Z452 Encounter for adjustment and management of vascular access device: Secondary | ICD-10-CM | POA: Insufficient documentation

## 2018-01-10 DIAGNOSIS — K639 Disease of intestine, unspecified: Secondary | ICD-10-CM

## 2018-01-10 DIAGNOSIS — C530 Malignant neoplasm of endocervix: Secondary | ICD-10-CM

## 2018-01-10 DIAGNOSIS — I10 Essential (primary) hypertension: Secondary | ICD-10-CM | POA: Insufficient documentation

## 2018-01-10 LAB — CBC WITH DIFFERENTIAL (CANCER CENTER ONLY)
Abs Immature Granulocytes: 0.03 10*3/uL (ref 0.00–0.07)
Basophils Absolute: 0.1 10*3/uL (ref 0.0–0.1)
Basophils Relative: 1 %
EOS PCT: 1 %
Eosinophils Absolute: 0.1 10*3/uL (ref 0.0–0.5)
HCT: 33.5 % — ABNORMAL LOW (ref 36.0–46.0)
HEMOGLOBIN: 11 g/dL — AB (ref 12.0–15.0)
Immature Granulocytes: 1 %
Lymphocytes Relative: 34 %
Lymphs Abs: 1.9 10*3/uL (ref 0.7–4.0)
MCH: 29.4 pg (ref 26.0–34.0)
MCHC: 32.8 g/dL (ref 30.0–36.0)
MCV: 89.6 fL (ref 80.0–100.0)
MONO ABS: 0.4 10*3/uL (ref 0.1–1.0)
Monocytes Relative: 7 %
Neutro Abs: 3.1 10*3/uL (ref 1.7–7.7)
Neutrophils Relative %: 56 %
Platelet Count: 399 10*3/uL (ref 150–400)
RBC: 3.74 MIL/uL — ABNORMAL LOW (ref 3.87–5.11)
RDW: 14.6 % (ref 11.5–15.5)
WBC Count: 5.5 10*3/uL (ref 4.0–10.5)
nRBC: 0 % (ref 0.0–0.2)

## 2018-01-10 LAB — BASIC METABOLIC PANEL - CANCER CENTER ONLY
Anion gap: 8 (ref 5–15)
BUN: 9 mg/dL (ref 8–23)
CHLORIDE: 105 mmol/L (ref 98–111)
CO2: 26 mmol/L (ref 22–32)
CREATININE: 0.69 mg/dL (ref 0.44–1.00)
Calcium: 9.7 mg/dL (ref 8.9–10.3)
GFR, Est AFR Am: >60 mL/min (ref >60)
GFR, Estimated: >60 mL/min (ref >60)
Glucose, Bld: 108 mg/dL — ABNORMAL HIGH (ref 70–99)
Potassium: 4.2 mmol/L (ref 3.5–5.1)
Sodium: 139 mmol/L (ref 135–145)

## 2018-01-10 MED ORDER — HEPARIN SOD (PORK) LOCK FLUSH 100 UNIT/ML IV SOLN
500.0000 [IU] | Freq: Once | INTRAVENOUS | Status: DC
  Filled 2018-01-10: qty 5

## 2018-01-10 MED ORDER — SODIUM CHLORIDE 0.9% FLUSH
10.0000 mL | INTRAVENOUS | Status: DC | PRN
  Administered 2018-01-10: 10 mL via INTRAVENOUS
  Filled 2018-01-10: qty 10

## 2018-01-10 NOTE — Progress Notes (Signed)
Met with patient to introduce myself as her Arboriculturist and to offer available resources.  Discussed the one-time $1000 Advertising account executive and qualifications to assist with personal expenses while going through treatment. Patient states she does not qualify.   Gave her my card for any additional financial questions or concerns.

## 2018-01-10 NOTE — Progress Notes (Signed)
Nutrition Assessment   Reason for Assessment:   Referral from Dr Zhao   ASSESSMENT:  67 year old female with new diagnosis of stage IV cervical carcinoma with L5 vertebral body metastatic disease and colonic lesion found near hepatic flexure.  Planning colonoscopy tomorrow.  Past medical history of DM, HLD, HTN.  Planning chemotherapy and radiation therapy followed by chemotherapy.    Met with patient in clinic today.  Patient reports that appetite is normal. Denies changes in bowel habits.  Reports normally has 1 bowel movement per day.  Reports that appetite is good but has lost weight and unsure why.  Reports typically has breakfast of eggs and toast, coffee and juice.  Lunch is usually out to eat, maybe sandwich, sub.  Supper is usually meat and vegetables (chicken, fish, spinach, green beans, potatoes).      Medications: vit d, decadron, omega 3, MVI, zofran, compazine   Labs: glucose 108   Anthropometrics:   Height: 61.5 inches Weight: 182 lb 2 oz UBW: 190 lb per patient.  Noted last 191 lb on 09/27/17 BMI: 33  4% weight loss since 3 1/2 months, not significant   Estimated Energy Needs  Kcals: 1800-2000 calories Protein: 74-98 g/d (Using IBW of 49 kg, 1.5-2 g/kg) Fluid: 2 L/d   NUTRITION DIAGNOSIS: Food and nutrition related knowledge deficit related to new cancer diagnosis as evidenced by discussion of nutrient rich foods during treatment.    INTERVENTION:  Discussed importance of eating nutrient dense foods during treatment.   Encouraged good sources of protein at every meal.  Examples of high protein foods provided. Discussed oral nutrition supplements as options for increase calories and protein.  Samples provided today with coupons.   Contact information provided   MONITORING, EVALUATION, GOAL: patient will consume adequate calories and protein to maintain weight during treatment   Next Visit: Wednesday, December 18 during infusion  Joli B. Dettmer, RD,  LDN Registered Dietitian [phone] (pager)        

## 2018-01-11 ENCOUNTER — Telehealth: Payer: Self-pay | Admitting: Oncology

## 2018-01-11 ENCOUNTER — Ambulatory Visit (AMBULATORY_SURGERY_CENTER): Payer: BC Managed Care – PPO | Admitting: Gastroenterology

## 2018-01-11 ENCOUNTER — Encounter: Payer: Self-pay | Admitting: Hematology and Oncology

## 2018-01-11 ENCOUNTER — Encounter: Payer: Self-pay | Admitting: Gastroenterology

## 2018-01-11 ENCOUNTER — Inpatient Hospital Stay: Payer: BC Managed Care – PPO

## 2018-01-11 VITALS — BP 158/94 | HR 72 | Temp 98.9°F | Resp 18 | Ht 61.5 in | Wt 182.0 lb

## 2018-01-11 DIAGNOSIS — K6389 Other specified diseases of intestine: Secondary | ICD-10-CM

## 2018-01-11 DIAGNOSIS — K635 Polyp of colon: Secondary | ICD-10-CM | POA: Diagnosis not present

## 2018-01-11 DIAGNOSIS — K5730 Diverticulosis of large intestine without perforation or abscess without bleeding: Secondary | ICD-10-CM | POA: Diagnosis not present

## 2018-01-11 DIAGNOSIS — D125 Benign neoplasm of sigmoid colon: Secondary | ICD-10-CM

## 2018-01-11 DIAGNOSIS — C183 Malignant neoplasm of hepatic flexure: Secondary | ICD-10-CM

## 2018-01-11 DIAGNOSIS — K641 Second degree hemorrhoids: Secondary | ICD-10-CM | POA: Diagnosis not present

## 2018-01-11 DIAGNOSIS — R9389 Abnormal findings on diagnostic imaging of other specified body structures: Secondary | ICD-10-CM

## 2018-01-11 MED ORDER — SODIUM CHLORIDE 0.9 % IV SOLN: 500.0000 mL | Freq: Once | INTRAVENOUS | Status: DC

## 2018-01-11 NOTE — Telephone Encounter (Signed)
Called [place] and advised her of appointments with Dr. Alvy Bimler on 01/24/18 and 02/21/18.  She verbalized understanding and agreement.

## 2018-01-11 NOTE — Progress Notes (Signed)
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 The intent is of palliative intent.  We discussed some of the risks, benefits, side-effects of cisplatin and its role as chemo sensitizing agent. The plan for weekly cisplatin for x5 doses along with radiation treatment.  Some of the short term side-effects included, though not limited to, including weight loss, life threatening infections, risk of allergic reactions, need for transfusions of blood products, nausea, vomiting, change in bowel habits, loss of hair, admission to hospital for various reasons, and risks of death.   Long term side-effects are also discussed including risks of infertility, permanent damage to nerve function, hearing loss, chronic fatigue, kidney damage with possibility needing hemodialysis, and rare secondary malignancy including bone marrow disorders.  The patient is aware that the response rates discussed earlier is not guaranteed.  After a long discussion, patient made an informed decision to proceed with the prescribed plan of care.   Patient education material was dispensed. I expressed my concern that Enalapril will increase risk of renal failure and recommend her to hold it while on treatment    Metastasis to bone [facility]) She is not symptomatic.  Recommend calcium and vitamin D supplement for now  Lesion of colon She will undergo colonoscopy soon The patient requested chemotherapy to be delayed until next week which I  think is reasonable  Hypertension Her blood pressure is very high due to anxiety We will continue current blood pressure management for now except she will start ACE inhibitor next week   Orders Placed This Encounter  Procedures  . Comprehensive metabolic panel    Standing Status:   Standing    Number of Occurrences:   22    Standing Expiration Date:   01/12/2019  . CBC with Differential/Platelet    Standing Status:   Standing    Number of Occurrences:   22    Standing Expiration Date:   01/12/2019  . Magnesium    Standing Status:   Standing    Number of Occurrences:   22    Standing Expiration Date:   01/12/2019    INTERVAL HISTORY: Please see below for problem oriented charting. She is seen for chemotherapy consent She denies back pain She is very nervous about upcoming treatment and would like to delay until next week due to anticipated colonoscopy She denies abdominal pain, changes in bowel habits or recent bleeding.  SUMMARY OF ONCOLOGIC HISTORY:   Cervical cancer ([place])   09/08/2017 Initial Diagnosis    Patient has a history of symptomatic endometrial fibroids in 2013 for which she underwent hysteroscopic resection with benign pathology.  She has had lifelong normal Pap smears including in 2019.  Of note HPV testing was not performed in this Pap in 2019.  She developed symptoms of very light vaginal spotting in August 2019     11/22/2017 Procedure    She was seen by her gynecologist, Dr. Radene Knee, who performed a pelvic examination.  On physical examination and endocervical mass was appreciated and felt to be likely to be a  polyp or fibroid was biopsied on 11/22/17.       11/22/2017 Pathology Results    This revealed poorly differentiated invasive carcinoma.  Immunostains revealed that it was CK7 positive, P 16+, p53 positive, PAX 8+ and CK 5 6 weakly positive.  CK 20, ER PR and p53 immunostains were negative.  The morphology and Immuno profile favored a gynecologic primary  with endocervical and endometrial adenocarcinoma in the differential diagnosis.     11/22/2017 Imaging    An ultrasound scan on November 22, 2017 revealed several intramural fibroids.  With saline infusion there was a 1.8 cm density within the endometrial cavity which could be a fibroid or polyp.     12/08/2017 Imaging    PET:  1. Marked hypermetabolism involving the cervix and uterus. Difficult to be certain of the origin but favor cervix. No evidence of parametrial or parauterine tumor and no pelvic or retroperitoneal hypermetabolic lymphadenopathy. 2. L5 vertebral body lesion worrisome for metastasis. 3. Hypermetabolic area involving the hepatic flexure region of the colon is worrisome for colon cancer. Recommend colonoscopy. 4. Benign left adrenal gland adenoma. 5. Symmetric hypermetabolism in the tongue base and tonsillar regions is likely inflamed lymphoid tissue. No neck mass or adenopathy.    12/19/2017 Imaging    1. Diffuse marrow replacement in the L5 vertebral body, indeterminate though metastatic disease is a concern. Correlate with upcoming biopsy. No extraosseous tumor or pathologic fracture. 2. Marrow signal abnormality on both sides of the T11-12 disc space favored to be degenerative. 3. Severe L4-5 facet arthrosis with grade 1 anterolisthesis, mild bilateral lateral recess stenosis, and mild-to-moderate bilateral neural foraminal stenosis. Findings may worsen with standing. 4. Moderate bilateral neural foraminal stenosis at L5-S1. 5. Partially visualized uterine masses which may reflect a combination of known adenocarcinoma and fibroids. 4 cm right adnexal mass may reflect an exophytic subserosal fibroid or ovarian neoplasm.    12/20/2017 Echocardiogram    LV EF: 60% -   65%    12/23/2017 Procedure    Successful placement of a right IJ approach Power Port with ultrasound and fluoroscopic guidance. The catheter is ready for use.    12/23/2017 Pathology Results    Bone,  biopsy, L5 - METASTATIC POORLY DIFFERENTIATED CARCINOMA. - SEE COMMENT. Microscopic Comment Dr. Vic Ripper has reviewed the case and concurs with this interpretation. The case was discussed wtih Joylene John on 12/22/2017. Per request, a block will be sent for PDL1 testing and the results reported separately    12/28/2017 -  Chemotherapy    The patient had palonosetron (ALOXI) injection 0.25 mg, 0.25 mg, Intravenous,  Once, 0 of 6 cycles CISplatin (PLATINOL) 76 mg in sodium chloride 0.9 % 250 mL chemo infusion, 40 mg/m2 = 76 mg, Intravenous,  Once, 0 of 6 cycles fosaprepitant (EMEND) 150 mg, dexamethasone (DECADRON) 12 mg in sodium chloride 0.9 % 145 mL IVPB, , Intravenous,  Once, 0 of 6 cycles  for chemotherapy treatment.     01/11/2018 Cancer Staging    Staging form: Cervix Uteri, AJCC 8th Edition - Clinical: FIGO Stage IVB (cT2, cN0, pM1) - Signed by Heath Lark, MD on 01/11/2018     REVIEW OF SYSTEMS:   Constitutional: Denies fevers, chills or abnormal weight loss Eyes: Denies blurriness of vision Ears, nose, mouth, throat, and face: Denies mucositis or sore throat Respiratory: Denies cough, dyspnea or wheezes Cardiovascular: Denies palpitation, chest discomfort or lower extremity swelling Gastrointestinal:  Denies nausea, heartburn or change in bowel habits Skin: Denies abnormal skin rashes  Lymphatics: Denies new lymphadenopathy or easy bruising Neurological:Denies numbness, tingling or new weaknesses Behavioral/Psych: Mood is stable, no new changes  All other systems were reviewed with the patient and are negative.  I have reviewed the past medical history, past surgical history, social history and family history with the patient and they are unchanged from previous note.  ALLERGIES:  is allergic to clarithromycin; dilacor [diltiazem hcl]; and norvasc [amlodipine besylate].  MEDICATIONS:  Current Outpatient Medications  Medication Sig Dispense Refill  . amLODipine (NORVASC)  10 MG tablet Take 10 mg by mouth daily.     Marland Kitchen atenolol (TENORMIN) 50 MG tablet Take 50 mg by mouth daily.  1  . Cholecalciferol (VITAMIN D3) 5000 units CAPS Take 5,000 Units by mouth daily.     Marland Kitchen dexamethasone (DECADRON) 4 MG tablet Take 2 tablets by mouth once a day on the day after chemotherapy and then take 2 tablets two times a day for 2 days. Take with food. 30 tablet 1  . enalapril (VASOTEC) 20 MG tablet Take 20 mg by mouth daily.  1  . fish oil-omega-3 fatty acids 1000 MG capsule Take 1 g by mouth daily.     Marland Kitchen lidocaine-prilocaine (EMLA) cream Apply to affected area once 30 g 3  . LORazepam (ATIVAN) 0.5 MG tablet Take 1 tablet (0.5 mg total) by mouth every 6 (six) hours as needed (Nausea or vomiting). 30 tablet 0  . Multiple Vitamin (MULTIVITAMIN WITH MINERALS) TABS tablet Take 1 tablet by mouth daily.    . ondansetron (ZOFRAN) 8 MG tablet Take 1 tablet (8 mg total) by mouth 2 (two) times daily as needed. Start on the third day after chemotherapy. 30 tablet 1  . prochlorperazine (COMPAZINE) 10 MG tablet Take 1 tablet (10 mg total) by mouth every 6 (six) hours as needed (Nausea or vomiting). 30 tablet 1  . Sod Picosulfate-Mag Ox-Cit Acd (CLENPIQ) 10-3.5-12 MG-GM -GM/160ML SOLN Take 1 kit by mouth as directed. 2 Bottle 0  . spironolactone (ALDACTONE) 25 MG tablet Take 1 tablet (25 mg total) by mouth daily. 90 tablet 3   No current facility-administered medications for this visit.     PHYSICAL EXAMINATION: ECOG PERFORMANCE STATUS: 1 - Symptomatic but completely ambulatory  Vitals:   01/10/18 1326  BP: (!) 164/82  Pulse: 67  Resp: 18  Temp: 98.5 F (36.9 C)  SpO2: 100%   Filed Weights   01/10/18 1326  Weight: 179 lb (81.2 kg)    GENERAL:alert, no distress and comfortable SKIN: skin color, texture, turgor are normal, no rashes or significant lesions EYES: normal, Conjunctiva are pink and non-injected, sclera clear OROPHARYNX:no exudate, no erythema and lips, buccal mucosa, and  tongue normal  NECK: supple, thyroid normal size, non-tender, without nodularity LYMPH:  no palpable lymphadenopathy in the cervical, axillary or inguinal LUNGS: clear to auscultation and percussion with normal breathing effort HEART: regular rate & rhythm and no murmurs and no lower extremity edema ABDOMEN:abdomen soft, non-tender and normal bowel sounds Musculoskeletal:no cyanosis of digits and no clubbing  NEURO: alert & oriented x 3 with fluent speech, no focal motor/sensory deficits  LABORATORY DATA:  I have reviewed the data as listed    Component Value Date/Time   NA 139 01/10/2018 1023   K 4.2 01/10/2018 1023   CL 105 01/10/2018 1023   CO2 26 01/10/2018 1023   GLUCOSE 108 (H) 01/10/2018 1023   BUN 9 01/10/2018 1023   CREATININE 0.69 01/10/2018 1023   CALCIUM 9.7 01/10/2018 1023  PROT 7.5 12/28/2017 1333   ALBUMIN 3.7 12/28/2017 1333   AST 12 (L) 12/28/2017 1333   ALT 13 12/28/2017 1333   ALKPHOS 87 12/28/2017 1333   BILITOT 0.4 12/28/2017 1333   GFRNONAA >60 01/10/2018 1023   GFRAA >60 01/10/2018 1023    No results found for: SPEP, UPEP  Lab Results  Component Value Date   WBC 5.5 01/10/2018   NEUTROABS 3.1 01/10/2018   HGB 11.0 (L) 01/10/2018   HCT 33.5 (L) 01/10/2018   MCV 89.6 01/10/2018   PLT 399 01/10/2018      Chemistry      Component Value Date/Time   NA 139 01/10/2018 1023   K 4.2 01/10/2018 1023   CL 105 01/10/2018 1023   CO2 26 01/10/2018 1023   BUN 9 01/10/2018 1023   CREATININE 0.69 01/10/2018 1023      Component Value Date/Time   CALCIUM 9.7 01/10/2018 1023   ALKPHOS 87 12/28/2017 1333   AST 12 (L) 12/28/2017 1333   ALT 13 12/28/2017 1333   BILITOT 0.4 12/28/2017 1333       RADIOGRAPHIC STUDIES: I have personally reviewed the radiological images as listed and agreed with the findings in the report. Mr Lumbar Spine W Wo Contrast  Result Date: 12/20/2017 CLINICAL DATA:  Hypermetabolic L5 vertebral lesion on PET-CT. MRI for biopsy  planning. History of cervical cancer. EXAM: MRI LUMBAR SPINE WITHOUT AND WITH CONTRAST TECHNIQUE: Multiplanar and multiecho pulse sequences of the lumbar spine were obtained without and with intravenous contrast. CONTRAST:  9 mL Gadavist COMPARISON:  PET/CT 12/08/2017 FINDINGS: Segmentation:  Standard. Alignment: Facet mediated anterolisthesis of L4 on L5 measuring 5 mm. Vertebrae: There is diffuse replacement of normal marrow signal in the L5 vertebral body with heterogeneous STIR hyperintensity and heterogeneously intense enhancement throughout. No vertebral body height loss or extraosseous tumor is evident. Bilateral posterior element edema may be degenerative in nature given advanced L4-5 facet arthritis. A 12 mm T1 and T2 hypointense focus anteriorly in the T12 vertebral body corresponds to an area of sclerosis on CT and was without significant hypermetabolism reported on PET. There is a small amount of surrounding STIR hyperintensity and enhancement, and there is a similar but smaller area along the T11 inferior endplate measuring 8 mm. The T11 and T12 lesions are associated with anterior vertebral osteophyte formation and are favored to be degenerative. Conus medullaris and cauda equina: Conus extends to the L1 level. Conus and cauda equina appear normal. Paraspinal and other soft tissues: Partial visualization of multiple uterine masses as well as of a mixed cystic and solid right adnexal mass posterior to the uterus measuring approximately 4.1 x 2.7 cm. Disc levels: Disc desiccation throughout the lumbar and lower thoracic spine. Severe disc space narrowing anteriorly at T11-12 with moderate narrowing at T10-11 and L4-5. T11-12: Only imaged sagittally. Disc bulging without evidence of significant stenosis or spinal cord mass effect. T12-L1: Negative. L1-2: Negative. L2-3: Minimal disc bulging without stenosis. L3-4: Minimal disc bulging without stenosis. L4-5: Anterolisthesis with bulging uncovered disc,  mild ligamentum flavum hypertrophy, and severe facet arthrosis result in mild bilateral lateral recess stenosis and mild-to-moderate bilateral neural foraminal stenosis. The facet joints are gaping with large effusions which can be seen in the setting of instability. Small subchondral cysts are associated with the right facet joint, and there is a small synovial cyst posterior to the right facet joint. L5-S1: Circumferential disc bulging and moderate facet arthrosis result in moderate bilateral neural foraminal stenosis without spinal  stenosis. There are small facet joint effusions. IMPRESSION: 1. Diffuse marrow replacement in the L5 vertebral body, indeterminate though metastatic disease is a concern. Correlate with upcoming biopsy. No extraosseous tumor or pathologic fracture. 2. Marrow signal abnormality on both sides of the T11-12 disc space favored to be degenerative. 3. Severe L4-5 facet arthrosis with grade 1 anterolisthesis, mild bilateral lateral recess stenosis, and mild-to-moderate bilateral neural foraminal stenosis. Findings may worsen with standing. 4. Moderate bilateral neural foraminal stenosis at L5-S1. 5. Partially visualized uterine masses which may reflect a combination of known adenocarcinoma and fibroids. 4 cm right adnexal mass may reflect an exophytic subserosal fibroid or ovarian neoplasm. Electronically Signed   By: Logan Bores M.D.   On: 12/20/2017 09:09   Ir Fluoro Guide Ndl Plmt / Bx  Result Date: 12/22/2017 CLINICAL DATA:  History of cervical carcinoma. Hypermetabolic L5 vertebral body lesion. EXAM: DEEP CORE L5 VERTEBRAL BODY BONE UNDER FLUOROSCOPY MEDICATIONS: None indicated ANESTHESIA/SEDATION: Intravenous Fentanyl and Versed were administered as conscious sedation during continuous monitoring of the patient's level of consciousness and physiological / cardiorespiratory status by the radiology RN, with a total moderate sedation time of 22 minutes. PROCEDURE: Informed written  consent was obtained from the patient after a thorough discussion of the procedural risks, benefits and alternatives. All questions were addressed. Maximal Sterile Barrier Technique was utilized including caps, mask, sterile gowns, sterile gloves, sterile drape, hand hygiene and skin antiseptic. A timeout was performed prior to the initiation of the procedure. Patient placed prone. Lumbar region prepped with Betadine, draped in usual sterile fashion. After local infiltration with 1% lidocaine, a Kyphon trocar bone needle advanced to the posterior margin of the L5 vertebral body using a left transpedicular approach. This was exchanged over pin for the working needle, advanced into the posterior aspect of the L5 vertebral body. Needle position confirmed on biplane imaging. Coaxial core biopsy samples x2 obtained, placed in formalin. The access needle was removed. The patient tolerated the procedure well. FLUOROSCOPY TIME:  2.5 minutes; 0350 uGym2 DAP COMPLICATIONS: None immediate. IMPRESSION: 1. Technically successful L5 vertebral body deep bone core biopsy under fluoroscopy Electronically Signed   By: Lucrezia Europe M.D.   On: 12/22/2017 08:39   Ir Imaging Guided Port Insertion  Result Date: 12/23/2017 INDICATION: 67 year old female with cervical cancer in need of durable venous access for chemotherapy. EXAM: IMPLANTED PORT A CATH PLACEMENT WITH ULTRASOUND AND FLUOROSCOPIC GUIDANCE MEDICATIONS: 2 g Ancef; The antibiotic was administered within an appropriate time interval prior to skin puncture. ANESTHESIA/SEDATION: Versed 2 mg IV; Fentanyl 100 mcg IV; Moderate Sedation Time:  21 minutes The patient was continuously monitored during the procedure by the interventional radiology nurse under my direct supervision. FLUOROSCOPY TIME:  0 minutes, 18 seconds (4 mGy) COMPLICATIONS: None immediate. PROCEDURE: The right neck and chest was prepped with chlorhexidine, and draped in the usual sterile fashion using maximum  barrier technique (cap and mask, sterile gown, sterile gloves, large sterile sheet, hand hygiene and cutaneous antiseptic). Antibiotic prophylaxis was provided with 2g Ancef administered IV one hour prior to skin incision. Local anesthesia was attained by infiltration with 1% lidocaine with epinephrine. Ultrasound demonstrated patency of the right internal jugular vein, and this was documented with an image. Under real-time ultrasound guidance, this vein was accessed with a 21 gauge micropuncture needle and image documentation was performed. A small dermatotomy was made at the access site with an 11 scalpel. A 0.018" wire was advanced into the SVC and the access needle exchanged for  a 83F micropuncture vascular sheath. The 0.018" wire was then removed and a 0.035" wire advanced into the IVC. An appropriate location for the subcutaneous reservoir was selected below the clavicle and an incision was made through the skin and underlying soft tissues. The subcutaneous tissues were then dissected using a combination of blunt and sharp surgical technique and a pocket was formed. A single lumen power injectable portacatheter was then tunneled through the subcutaneous tissues from the pocket to the dermatotomy and the port reservoir placed within the subcutaneous pocket. The venous access site was then serially dilated and a peel away vascular sheath placed over the wire. The wire was removed and the port catheter advanced into position under fluoroscopic guidance. The catheter tip is positioned in the upper right atrium. This was documented with a spot image. The portacatheter was then tested and found to flush and aspirate well. The port was flushed with saline followed by 100 units/mL heparinized saline. The pocket was then closed in two layers using first subdermal inverted interrupted absorbable sutures followed by a running subcuticular suture. The epidermis was then sealed with Dermabond. The dermatotomy at the venous  access site was also closed with a single inverted subdermal suture and the epidermis sealed with Dermabond. IMPRESSION: Successful placement of a right IJ approach Power Port with ultrasound and fluoroscopic guidance. The catheter is ready for use. Electronically Signed   By: Jacqulynn Cadet M.D.   On: 12/23/2017 14:47    All questions were answered. The patient knows to call the clinic with any problems, questions or concerns. No barriers to learning was detected.  I spent 30 minutes counseling the patient face to face. The total time spent in the appointment was 40 minutes and more than 50% was on counseling and review of test results  Heath Lark, MD 01/11/2018 10:11 AM

## 2018-01-11 NOTE — Assessment & Plan Note (Signed)
She is not symptomatic.  Recommend calcium and vitamin D supplement for now

## 2018-01-11 NOTE — Assessment & Plan Note (Signed)
She will undergo colonoscopy soon The patient requested chemotherapy to be delayed until next week which I think is reasonable

## 2018-01-11 NOTE — Patient Instructions (Signed)
Handout given on polyps, diverticulosis, high fiber diet and hemorrhoids. 2 polyps removed today and sent to lab.   YOU HAD AN ENDOSCOPIC PROCEDURE TODAY AT [facility]:   Refer to the procedure report that was given to you for any specific questions about what was found during the examination.  If the procedure report does not answer your questions, please call your gastroenterologist to clarify.  If you requested that your care partner not be given the details of your procedure findings, then the procedure report has been included in a sealed envelope for you to review at your convenience later.  YOU SHOULD EXPECT: Some feelings of bloating in the abdomen. Passage of more gas than usual.  Walking can help get rid of the air that was put into your GI tract during the procedure and reduce the bloating. If you had a lower endoscopy (such as a colonoscopy or flexible sigmoidoscopy) you may notice spotting of blood in your stool or on the toilet paper. If you underwent a bowel prep for your procedure, you may not have a normal bowel movement for a few days.  Please Note:  You might notice some irritation and congestion in your nose or some drainage.  This is from the oxygen used during your procedure.  There is no need for concern and it should clear up in a day or so.  SYMPTOMS TO REPORT IMMEDIATELY:   Following lower endoscopy (colonoscopy or flexible sigmoidoscopy):  Excessive amounts of blood in the stool  Significant tenderness or worsening of abdominal pains  Swelling of the abdomen that is new, acute  Fever of 100F or higher   For urgent or emergent issues, a gastroenterologist can be reached at any hour by calling [phone].   DIET:  We do recommend a small meal at first, but then you may proceed to your regular diet.  Drink plenty of fluids but you should avoid alcoholic beverages for 24 hours.  ACTIVITY:  You should plan to take it easy for the rest of today and  you should NOT DRIVE or use heavy machinery until tomorrow (because of the sedation medicines used during the test).    FOLLOW UP: Our staff will call the number listed on your records the next business day following your procedure to check on you and address any questions or concerns that you may have regarding the information given to you following your procedure. If we do not reach you, we will leave a message.  However, if you are feeling well and you are not experiencing any problems, there is no need to return our call.  We will assume that you have returned to your regular daily activities without incident.  If any biopsies were taken you will be contacted by phone or by letter within the next 1-3 weeks.  Please call us at [phone] if you have not heard about the biopsies in 3 weeks.    SIGNATURES/CONFIDENTIALITY: You and/or your care partner have signed paperwork which will be entered into your electronic medical record.  These signatures attest to the fact that that the information above on your After Visit Summary has been reviewed and is understood.  Full responsibility of the confidentiality of this discharge information lies with you and/or your care-partner.

## 2018-01-11 NOTE — Assessment & Plan Note (Signed)
Her blood pressure is very high due to anxiety We will continue current blood pressure management for now except she will start ACE inhibitor next week

## 2018-01-11 NOTE — Assessment & Plan Note (Signed)
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 The intent is of palliative intent.  We discussed some of the risks, benefits, side-effects of cisplatin and its role as chemo sensitizing agent. The plan for weekly cisplatin for x5 doses along with radiation treatment.  Some of the short term side-effects included, though not limited to, including weight loss, life threatening infections, risk of allergic reactions, need for transfusions of blood products, nausea, vomiting, change in bowel habits, loss of hair, admission to hospital for various reasons, and risks of death.   Long term side-effects are also discussed including risks of infertility, permanent damage to nerve function, hearing loss, chronic fatigue, kidney damage with possibility needing hemodialysis, and rare secondary malignancy including bone marrow disorders.  The patient is aware that the response rates discussed earlier is not guaranteed.  After a long discussion, patient made an informed decision to proceed with the prescribed plan of care.   Patient education material was dispensed. I expressed my concern that Enalapril will increase risk of renal failure and recommend her to hold it while on treatment

## 2018-01-11 NOTE — Progress Notes (Signed)
Report to RN, VSS, adequate respirations noted, no c/o pain or discomfort 

## 2018-01-11 NOTE — Progress Notes (Signed)
Called to room to assist during endoscopic procedure.  Patient ID and intended procedure confirmed with present staff. Received instructions for my participation in the procedure from the performing physician.  

## 2018-01-12 ENCOUNTER — Other Ambulatory Visit: Payer: Self-pay

## 2018-01-12 ENCOUNTER — Ambulatory Visit: Payer: BC Managed Care – PPO

## 2018-01-12 ENCOUNTER — Telehealth: Payer: Self-pay | Admitting: Oncology

## 2018-01-12 ENCOUNTER — Telehealth: Payer: Self-pay

## 2018-01-12 ENCOUNTER — Other Ambulatory Visit: Payer: BC Managed Care – PPO

## 2018-01-12 DIAGNOSIS — D125 Benign neoplasm of sigmoid colon: Secondary | ICD-10-CM

## 2018-01-12 DIAGNOSIS — K635 Polyp of colon: Secondary | ICD-10-CM

## 2018-01-12 DIAGNOSIS — K5730 Diverticulosis of large intestine without perforation or abscess without bleeding: Secondary | ICD-10-CM

## 2018-01-12 DIAGNOSIS — K6389 Other specified diseases of intestine: Secondary | ICD-10-CM

## 2018-01-12 DIAGNOSIS — R9389 Abnormal findings on diagnostic imaging of other specified body structures: Secondary | ICD-10-CM

## 2018-01-12 NOTE — Telephone Encounter (Signed)
  Follow up Call-  Call back number 01/11/2018  Post procedure Call Back phone  # [phone]  Permission to leave phone message Yes  Some recent data might be hidden     Patient questions:  Do you have a fever, pain , or abdominal swelling? No. Pain Score  0 *  Have you tolerated food without any problems? Yes.    Have you been able to return to your normal activities? Yes.    Do you have any questions about your discharge instructions: Diet   No. Medications  No. Follow up visit  No.  Do you have questions or concerns about your Care? No.  Actions: * If pain score is 4 or above: No action needed, pain <4.  Questioned patient regarding getting her CEA labwork done yesterday.  She stated that she didn't get it done yesterday and that it must have been overlooked.  Advised pt that we would call the lab to advise them that she would come in today and have the bloodwork done.  Pt agreed to come in and have lab work done today.

## 2018-01-12 NOTE — Telephone Encounter (Signed)
Requested additional stains for Accession: [id number] with Suanne Marker in [facility].

## 2018-01-12 NOTE — Op Note (Signed)
[place] Patient Name: Toni Peterson Procedure Date: 01/11/2018 1:46 PM MRN: [id number] Endoscopist: Gerrit Heck , MD Age: 67 Referring MD:  Date of Birth: 01/21/1951 Gender: Female Account #: [id number] Procedure:                Colonoscopy Indications:              Abnormal PET scan of the GI tract- FDG avid lesion                            noted in the hepatic flexure. Medicines:                Monitored Anesthesia Care Procedure:                Pre-Anesthesia Assessment:                           - Prior to the procedure, a History and Physical                            was performed, and patient medications and                            allergies were reviewed. The patient's tolerance of                            previous anesthesia was also reviewed. The risks                            and benefits of the procedure and the sedation                            options and risks were discussed with the patient.                            All questions were answered, and informed consent                            was obtained. Prior Anticoagulants: The patient has                            taken no previous anticoagulant or antiplatelet                            agents. ASA Grade Assessment: II - A patient with                            mild systemic disease. After reviewing the risks                            and benefits, the patient was deemed in                            satisfactory condition to undergo the procedure.  After obtaining informed consent, the colonoscope                            was passed under direct vision. Throughout the                            procedure, the patient's blood pressure, pulse, and                            oxygen saturations were monitored continuously. The                            Colonoscope was introduced through the anus and                            advanced to the the terminal  ileum. The colonoscopy                            was performed without difficulty. The patient                            tolerated the procedure well. The quality of the                            bowel preparation was adequate. Scope In: 1:53:56 PM Scope Out: 2:14:46 PM Scope Withdrawal Time: 0 hours 18 minutes 39 seconds  Total Procedure Duration: 0 hours 20 minutes 50 seconds  Findings:                 Hemorrhoids were found on perianal exam.                           A frond-like/villous and fungating partially                            obstructing medium-sized mass was found at the                            hepatic flexure. The mass was non-circumferential.                            This was traversed. No bl                           eeding was present. Multiple mucosal biopsies were                            taken with a cold forceps for histology. Estimated                            blood loss was minimal. Area 3 cm distal to the                            lesion in the proximal transverse colon was  tattooed with an injection of 2 mL of Spot (carbon                            black). Tattoo placed on mesenteric and                            anti-mesenteric sides.                           Two sessile polyps were found in the sigmoid colon.                            The polyps were 2 to 5 mm in size. These polyps                            were removed with a cold snare. Resection and                            retrieval were complete. Estimated blood loss was                            minimal.                           Multiple small and large-mouthed diverticula were                            found in the sigmoid colon, distal descending colon                            and ascending colon.                           Non-bleeding internal hemorrhoids were found during                            retroflexion. The hemorrhoids were  medium-sized.                           The terminal ileum appeared normal. Complications:            No immediate complications. Estimated Blood Loss:     Estimated blood loss was minimal. Impression:               - Hemorrhoids found on perianal exam.                           - Malignant partially obstructing tumor at the                            hepatic flexure. Biopsied. Tattooed.                           - Two 2 to 5 mm polyps in the sigmoid colon,  removed with a cold snare. Resected and retrieved.                           - Diverticulosis in the sigmoid colon, in the                            distal descending colon and in the ascending colon.                           - Non-bleeding internal hemorrhoids.                           - The examined portion of the ileum was normal. Recommendation:           - Patient has a contact number available for                            emergencies. The signs and symptoms of potential                            delayed complications were discussed with the                            patient. Return to normal activities tomorrow.                            Written discharge instructions were provided to the                            patient.                           - Resume previous diet today.                           - Continue present medications.                           - Await pathology results.                           - Check CEA today.                           - Follow-up with your referring physician, Dr.                            Alvy Bimler, as previously scheduled. Gerrit Heck, MD 01/11/2018 2:25:01 PM

## 2018-01-13 ENCOUNTER — Encounter: Payer: Self-pay | Admitting: Hematology and Oncology

## 2018-01-13 DIAGNOSIS — C189 Malignant neoplasm of colon, unspecified: Secondary | ICD-10-CM | POA: Insufficient documentation

## 2018-01-13 LAB — CEA: CEA: <0.5 ng/mL

## 2018-01-16 ENCOUNTER — Inpatient Hospital Stay: Payer: BC Managed Care – PPO

## 2018-01-16 ENCOUNTER — Ambulatory Visit
Admission: RE | Admit: 2018-01-16 | Discharge: 2018-01-16 | Disposition: A | Discharge status: Home / Self Care | Payer: BC Managed Care – PPO | Source: Ambulatory Visit | Attending: Radiation Oncology | Admitting: Radiation Oncology

## 2018-01-16 DIAGNOSIS — C539 Malignant neoplasm of cervix uteri, unspecified: Secondary | ICD-10-CM

## 2018-01-16 DIAGNOSIS — C530 Malignant neoplasm of endocervix: Secondary | ICD-10-CM

## 2018-01-16 DIAGNOSIS — Z5111 Encounter for antineoplastic chemotherapy: Secondary | ICD-10-CM | POA: Diagnosis not present

## 2018-01-16 DIAGNOSIS — C7951 Secondary malignant neoplasm of bone: Secondary | ICD-10-CM

## 2018-01-16 LAB — CBC WITH DIFFERENTIAL/PLATELET
Abs Immature Granulocytes: 0.03 10*3/uL (ref 0.00–0.07)
Basophils Absolute: 0.1 10*3/uL (ref 0.0–0.1)
Basophils Relative: 1 %
EOS PCT: 2 %
Eosinophils Absolute: 0.1 10*3/uL (ref 0.0–0.5)
HCT: 32.5 % — ABNORMAL LOW (ref 36.0–46.0)
Hemoglobin: 10.5 g/dL — ABNORMAL LOW (ref 12.0–15.0)
Immature Granulocytes: 1 %
Lymphocytes Relative: 35 %
Lymphs Abs: 2.1 10*3/uL (ref 0.7–4.0)
MCH: 29 pg (ref 26.0–34.0)
MCHC: 32.3 g/dL (ref 30.0–36.0)
MCV: 89.8 fL (ref 80.0–100.0)
Monocytes Absolute: 0.5 10*3/uL (ref 0.1–1.0)
Monocytes Relative: 8 %
Neutro Abs: 3.3 10*3/uL (ref 1.7–7.7)
Neutrophils Relative %: 53 %
Platelets: 405 10*3/uL — ABNORMAL HIGH (ref 150–400)
RBC: 3.62 MIL/uL — ABNORMAL LOW (ref 3.87–5.11)
RDW: 14.7 % (ref 11.5–15.5)
WBC: 6.1 10*3/uL (ref 4.0–10.5)
nRBC: 0 % (ref 0.0–0.2)

## 2018-01-16 LAB — COMPREHENSIVE METABOLIC PANEL
ALT: 14 U/L (ref 0–44)
AST: 14 U/L — ABNORMAL LOW (ref 15–41)
Albumin: 3.4 g/dL — ABNORMAL LOW (ref 3.5–5.0)
Alkaline Phosphatase: 83 U/L (ref 38–126)
Anion gap: 10 (ref 5–15)
BUN: 15 mg/dL (ref 8–23)
CO2: 25 mmol/L (ref 22–32)
Calcium: 9.4 mg/dL (ref 8.9–10.3)
Chloride: 105 mmol/L (ref 98–111)
Creatinine, Ser: 0.77 mg/dL (ref 0.44–1.00)
GFR calc Af Amer: >60 mL/min (ref >60)
GFR calc non Af Amer: >60 mL/min (ref >60)
Glucose, Bld: 110 mg/dL — ABNORMAL HIGH (ref 70–99)
Potassium: 4.1 mmol/L (ref 3.5–5.1)
Sodium: 140 mmol/L (ref 135–145)
Total Bilirubin: <0.2 mg/dL — ABNORMAL LOW (ref 0.3–1.2)
Total Protein: 7.2 g/dL (ref 6.5–8.1)

## 2018-01-16 LAB — MAGNESIUM: Magnesium: 2.3 mg/dL (ref 1.7–2.4)

## 2018-01-16 MED ORDER — HEPARIN SOD (PORK) LOCK FLUSH 100 UNIT/ML IV SOLN
500.0000 [IU] | Freq: Once | INTRAVENOUS | Status: AC
  Administered 2018-01-16: 500 [IU]
  Filled 2018-01-16: qty 5

## 2018-01-16 MED ORDER — SODIUM CHLORIDE 0.9% FLUSH
10.0000 mL | Freq: Once | INTRAVENOUS | Status: AC
  Administered 2018-01-16: 10 mL
  Filled 2018-01-16: qty 10

## 2018-01-16 NOTE — Progress Notes (Signed)
  Radiation Oncology         ([phone]) [phone] ________________________________  Name: Toni Peterson MRN: [id number]  Date: 01/16/2018  DOB: Jun 21, 1950  Simulation Verification Note    ICD-10-CM   1. 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 The clinical setup was verified. Then port films were obtained and uploaded to the radiation oncology medical record software.  The treatment beams were carefully compared against the planned radiation fields. The position location and shape of the radiation fields was reviewed. They targeted volume of tissue appears to be appropriately covered by the radiation beams. Organs at risk appear to be excluded as planned.  Based on my personal review, I approved the simulation verification. The patient's treatment will proceed as planned.  -----------------------------------  Blair Promise, PhD, MD  This document serves as a record of services personally performed by Gery Pray, MD. It was created on his behalf by Mary-Margaret Loma Messing, a trained medical scribe. The creation of this record is based on the scribe's personal observations and the provider's statements to them. This document has been checked and approved by the attending provider.

## 2018-01-17 ENCOUNTER — Telehealth: Payer: Self-pay | Admitting: Oncology

## 2018-01-17 ENCOUNTER — Telehealth: Payer: Self-pay | Admitting: Hematology and Oncology

## 2018-01-17 ENCOUNTER — Ambulatory Visit
Admission: RE | Admit: 2018-01-17 | Discharge: 2018-01-17 | Disposition: A | Discharge status: Home / Self Care | Payer: BC Managed Care – PPO | Source: Ambulatory Visit | Attending: Radiation Oncology | Admitting: Radiation Oncology

## 2018-01-17 ENCOUNTER — Encounter: Payer: Self-pay | Admitting: Hematology and Oncology

## 2018-01-17 ENCOUNTER — Inpatient Hospital Stay (HOSPITAL_BASED_OUTPATIENT_CLINIC_OR_DEPARTMENT_OTHER): Payer: BC Managed Care – PPO | Admitting: Hematology and Oncology

## 2018-01-17 ENCOUNTER — Other Ambulatory Visit: Payer: Self-pay

## 2018-01-17 DIAGNOSIS — I10 Essential (primary) hypertension: Secondary | ICD-10-CM | POA: Diagnosis not present

## 2018-01-17 DIAGNOSIS — C189 Malignant neoplasm of colon, unspecified: Secondary | ICD-10-CM | POA: Diagnosis not present

## 2018-01-17 DIAGNOSIS — C530 Malignant neoplasm of endocervix: Secondary | ICD-10-CM

## 2018-01-17 DIAGNOSIS — C183 Malignant neoplasm of hepatic flexure: Secondary | ICD-10-CM

## 2018-01-17 DIAGNOSIS — Z5111 Encounter for antineoplastic chemotherapy: Secondary | ICD-10-CM | POA: Diagnosis not present

## 2018-01-17 DIAGNOSIS — C7951 Secondary malignant neoplasm of bone: Secondary | ICD-10-CM

## 2018-01-17 NOTE — Progress Notes (Signed)
FMLA successfully faxed to [employer] at [phone]. Mailed copy to patient address on file. 

## 2018-01-17 NOTE — Telephone Encounter (Signed)
Requested MSI testing with Mauricia Area at [facility] for Accession: [id number].

## 2018-01-17 NOTE — Progress Notes (Signed)
Pt here for patient teaching.  Pt given Radiation and You booklet and skin care instructions.  Reviewed areas of pertinence such as diarrhea, fatigue, hair loss, nausea and vomiting, sexual and fertility changes, skin changes and urinary and bladder changes . Pt able to give teach back of to pat skin, use unscented/gentle soap, use baby wipes and have Imodium on hand,apply Radiaplex bid, apply Sonafine bid, avoid applying anything to skin within 4 hours of treatment, avoid wearing an under wire bra and to use an electric razor if they must shave. Pt demonstrated understanding and verbalizes understanding of information given and will contact nursing with any questions or concerns.     [email or web address]   Loma Sousa, RN BSN

## 2018-01-17 NOTE — Progress Notes (Signed)
[place] OFFICE PROGRESS NOTE  Patient Care Team: Antony Contras, MD as PCP - General (Family Medicine) Minus Breeding, MD as PCP - Cardiology (Cardiology)  ASSESSMENT & PLAN:  Cervical cancer [facility]) I have extensive discussion about her case with GYN oncologist, gastroenterologist and radiation oncologist. 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  Based on additional comment from pathologist, the bone biopsy appears to be originating from her GYN malignancy and not from her colon.  I have also discussed the case with a colleague of mine in regards to management of her colon cancer In summary, we will proceed with concurrent chemoradiation therapy for cervical cancer with weekly cisplatin I plan to repeat imaging study with PET CT scan within the month of completion of radiation therapy to assess response to treatment.  She may benefit from further chemotherapy sooner than later due to her advanced stage disease.  Metastasis to bone [facility]) She is currently not symptomatic.  She will receive palliative radiation therapy to her bone  Colon cancer [facility]) She has what appears to be early stage colon cancer.  Given the scope of things and her concurrent stage IV diagnosis of metastatic cervical cancer, we will delay surgery for colon cancer right now and will monitor closely for signs and symptoms of bowel obstruction.  Hypertension Her blood pressure is not elevated I recommend discontinuation of enalapril due to risk of renal failure   No orders of the defined types were placed in this encounter.   INTERVAL HISTORY: Please see below for problem oriented charting. She returns for further follow-up and discussion about recent colonoscopy finding She denies side effects from recent colonoscopy Denies abdominal pain, bloating or changes in bowel habits Denies recent bleeding She has minimum discomfort on her back but she does  not describe it as bone pain  SUMMARY OF ONCOLOGIC HISTORY: Oncology History   [facility]I - Stable on cervix biopsy PD-L1 2%  Bone biopsy is consistent with metastatic GYN primary     Cervical cancer ([place])   09/08/2017 Initial Diagnosis    Patient has a history of symptomatic endometrial fibroids in 2013 for which she underwent hysteroscopic resection with benign pathology.  She has had lifelong normal Pap smears including in 2019.  Of note HPV testing was not performed in this Pap in 2019.  She developed symptoms of very light vaginal spotting in August 2019     11/22/2017 Procedure    She was seen by her gynecologist, Dr. Radene Knee, who performed a pelvic examination.  On physical examination and endocervical mass was appreciated and felt to be likely to be a polyp or fibroid was biopsied on 11/22/17.       11/22/2017 Pathology Results    This revealed poorly differentiated invasive carcinoma.  Immunostains revealed that it was CK7 positive, P 16+, p53 positive, PAX 8+ and CK 5 6 weakly positive.  CK 20, ER PR and p53 immunostains were negative.  The morphology and Immuno profile favored a gynecologic primary with endocervical and endometrial adenocarcinoma in the differential diagnosis.     11/22/2017 Imaging    An ultrasound scan on November 22, 2017 revealed several intramural fibroids.  With saline infusion there was a 1.8 cm density within the endometrial cavity which could be a fibroid or polyp.     12/08/2017 Imaging    PET:  1. Marked hypermetabolism involving the cervix and uterus. Difficult to be certain of the origin but favor cervix.  No evidence of parametrial or parauterine tumor and no pelvic or retroperitoneal hypermetabolic lymphadenopathy. 2. L5 vertebral body lesion worrisome for metastasis. 3. Hypermetabolic area involving the hepatic flexure region of the colon is worrisome for colon cancer. Recommend colonoscopy. 4. Benign left adrenal gland adenoma. 5. Symmetric  hypermetabolism in the tongue base and tonsillar regions is likely inflamed lymphoid tissue. No neck mass or adenopathy.    12/19/2017 Imaging    1. Diffuse marrow replacement in the L5 vertebral body, indeterminate though metastatic disease is a concern. Correlate with upcoming biopsy. No extraosseous tumor or pathologic fracture. 2. Marrow signal abnormality on both sides of the T11-12 disc space favored to be degenerative. 3. Severe L4-5 facet arthrosis with grade 1 anterolisthesis, mild bilateral lateral recess stenosis, and mild-to-moderate bilateral neural foraminal stenosis. Findings may worsen with standing. 4. Moderate bilateral neural foraminal stenosis at L5-S1. 5. Partially visualized uterine masses which may reflect a combination of known adenocarcinoma and fibroids. 4 cm right adnexal mass may reflect an exophytic subserosal fibroid or ovarian neoplasm.    12/20/2017 Echocardiogram    LV EF: 60% -   65%    12/23/2017 Procedure    Successful placement of a right IJ approach Power Port with ultrasound and fluoroscopic guidance. The catheter is ready for use.    12/23/2017 Pathology Results    Bone, biopsy, L5 - METASTATIC POORLY DIFFERENTIATED CARCINOMA. - SEE COMMENT. Microscopic Comment Dr. Vic Ripper has reviewed the case and concurs with this interpretation. The case was discussed wtih Joylene John on 12/22/2017. Per request, a block will be sent for PDL1 testing and the results reported separately    01/11/2018 Cancer Staging    Staging form: Cervix Uteri, AJCC 8th Edition - Clinical: FIGO Stage IVB (cT2, cN0, pM1) - Signed by Heath Lark, MD on 01/11/2018    01/17/2018 -  Chemotherapy    The patient had weekly cisplatin     Colon cancer ([place])   01/11/2018 Procedure    - Hemorrhoids found on perianal exam. - Malignant partially obstructing tumor at the hepatic flexure. Biopsied. Tattooed. - Two 2 to 5 mm polyps in the sigmoid colon, removed with a cold snare. Resected  and retrieved. - Diverticulosis in the sigmoid colon, in the distal descending colon and in the ascending colon. - Non-bleeding internal hemorrhoids. - The examined portion of the ileum was normal.    01/11/2018 Pathology Results    1. Colon, biopsy, hepatic flexure - ADENOCARCINOMA, SEE COMMENT. 2. Colon, polyp(s), sigmoid, polyp (2) - HYPERPLASTIC POLYP (X2 FRAGMENTS). - NO DYSPLASIA OR MALIGNANCY. Microscopic Comment 1. There are additional fragments of tubular adenoma suggesting this is a primary colon adenocarcinoma.      REVIEW OF SYSTEMS:   Constitutional: Denies fevers, chills or abnormal weight loss Eyes: Denies blurriness of vision Ears, nose, mouth, throat, and face: Denies mucositis or sore throat Respiratory: Denies cough, dyspnea or wheezes Cardiovascular: Denies palpitation, chest discomfort or lower extremity swelling Gastrointestinal:  Denies nausea, heartburn or change in bowel habits Skin: Denies abnormal skin rashes Lymphatics: Denies new lymphadenopathy or easy bruising Neurological:Denies numbness, tingling or new weaknesses Behavioral/Psych: Mood is stable, no new changes  All other systems were reviewed with the patient and are negative.  I have reviewed the past medical history, past surgical history, social history and family history with the patient and they are unchanged from previous note.  ALLERGIES:  is allergic to clarithromycin; dilacor [diltiazem hcl]; and norvasc [amlodipine besylate].  MEDICATIONS:  Current Outpatient Medications  Medication Sig Dispense Refill  . amLODipine (NORVASC) 10 MG tablet Take 10 mg by mouth daily.     Marland Kitchen atenolol (TENORMIN) 50 MG tablet Take 50 mg by mouth daily.  1  . Cholecalciferol (VITAMIN D3) 5000 units CAPS Take 5,000 Units by mouth daily.     Marland Kitchen dexamethasone (DECADRON) 4 MG tablet Take 2 tablets by mouth once a day on the day after chemotherapy and then take 2 tablets two times a day for 2 days. Take with food.  (Patient not taking: Reported on 01/11/2018) 30 tablet 1  . fish oil-omega-3 fatty acids 1000 MG capsule Take 1 g by mouth daily.     Marland Kitchen lidocaine-prilocaine (EMLA) cream Apply to affected area once 30 g 3  . LORazepam (ATIVAN) 0.5 MG tablet Take 1 tablet (0.5 mg total) by mouth every 6 (six) hours as needed (Nausea or vomiting). (Patient not taking: Reported on 01/11/2018) 30 tablet 0  . Multiple Vitamin (MULTIVITAMIN WITH MINERALS) TABS tablet Take 1 tablet by mouth daily.    . ondansetron (ZOFRAN) 8 MG tablet Take 1 tablet (8 mg total) by mouth 2 (two) times daily as needed. Start on the third day after chemotherapy. (Patient not taking: Reported on 01/11/2018) 30 tablet 1  . prochlorperazine (COMPAZINE) 10 MG tablet Take 1 tablet (10 mg total) by mouth every 6 (six) hours as needed (Nausea or vomiting). (Patient not taking: Reported on 01/11/2018) 30 tablet 1  . spironolactone (ALDACTONE) 25 MG tablet Take 1 tablet (25 mg total) by mouth daily. 90 tablet 3   No current facility-administered medications for this visit.     PHYSICAL EXAMINATION: ECOG PERFORMANCE STATUS: 1 - Symptomatic but completely ambulatory  Vitals:   01/17/18 1049  BP: 135/69  Pulse: 68  Resp: 18  Temp: 98.4 F (36.9 C)  SpO2: 100%   Filed Weights   01/17/18 1049  Weight: 179 lb (81.2 kg)    GENERAL:alert, no distress and comfortable SKIN: skin color, texture, turgor are normal, no rashes or significant lesions EYES: normal, Conjunctiva are pink and non-injected, sclera clear OROPHARYNX:no exudate, no erythema and lips, buccal mucosa, and tongue normal  NECK: supple, thyroid normal size, non-tender, without nodularity LYMPH:  no palpable lymphadenopathy in the cervical, axillary or inguinal LUNGS: clear to auscultation and percussion with normal breathing effort HEART: regular rate & rhythm and no murmurs and no lower extremity edema ABDOMEN:abdomen soft, non-tender and normal bowel sounds Musculoskeletal:no  cyanosis of digits and no clubbing  NEURO: alert & oriented x 3 with fluent speech, no focal motor/sensory deficits  LABORATORY DATA:  I have reviewed the data as listed    Component Value Date/Time   NA 140 01/16/2018 1535   K 4.1 01/16/2018 1535   CL 105 01/16/2018 1535   CO2 25 01/16/2018 1535   GLUCOSE 110 (H) 01/16/2018 1535   BUN 15 01/16/2018 1535   CREATININE 0.77 01/16/2018 1535   CREATININE 0.69 01/10/2018 1023   CALCIUM 9.4 01/16/2018 1535   PROT 7.2 01/16/2018 1535   ALBUMIN 3.4 (L) 01/16/2018 1535   AST 14 (L) 01/16/2018 1535   AST 12 (L) 12/28/2017 1333   ALT 14 01/16/2018 1535   ALT 13 12/28/2017 1333   ALKPHOS 83 01/16/2018 1535   BILITOT <0.2 (L) 01/16/2018 1535   BILITOT 0.4 12/28/2017 1333   GFRNONAA >60 01/16/2018 1535   GFRNONAA >60 01/10/2018 1023   GFRAA >60 01/16/2018 1535   GFRAA >60 01/10/2018 1023    No results  found for: SPEP, UPEP  Lab Results  Component Value Date   WBC 6.1 01/16/2018   NEUTROABS 3.3 01/16/2018   HGB 10.5 (L) 01/16/2018   HCT 32.5 (L) 01/16/2018   MCV 89.8 01/16/2018   PLT 405 (H) 01/16/2018      Chemistry      Component Value Date/Time   NA 140 01/16/2018 1535   K 4.1 01/16/2018 1535   CL 105 01/16/2018 1535   CO2 25 01/16/2018 1535   BUN 15 01/16/2018 1535   CREATININE 0.77 01/16/2018 1535   CREATININE 0.69 01/10/2018 1023      Component Value Date/Time   CALCIUM 9.4 01/16/2018 1535   ALKPHOS 83 01/16/2018 1535   AST 14 (L) 01/16/2018 1535   AST 12 (L) 12/28/2017 1333   ALT 14 01/16/2018 1535   ALT 13 12/28/2017 1333   BILITOT <0.2 (L) 01/16/2018 1535   BILITOT 0.4 12/28/2017 1333       RADIOGRAPHIC STUDIES: I have personally reviewed the radiological images as listed and agreed with the findings in the report. Mr Lumbar Spine W Wo Contrast  Result Date: 12/20/2017 CLINICAL DATA:  Hypermetabolic L5 vertebral lesion on PET-CT. MRI for biopsy planning. History of cervical cancer. EXAM: MRI LUMBAR  SPINE WITHOUT AND WITH CONTRAST TECHNIQUE: Multiplanar and multiecho pulse sequences of the lumbar spine were obtained without and with intravenous contrast. CONTRAST:  9 mL Gadavist COMPARISON:  PET/CT 12/08/2017 FINDINGS: Segmentation:  Standard. Alignment: Facet mediated anterolisthesis of L4 on L5 measuring 5 mm. Vertebrae: There is diffuse replacement of normal marrow signal in the L5 vertebral body with heterogeneous STIR hyperintensity and heterogeneously intense enhancement throughout. No vertebral body height loss or extraosseous tumor is evident. Bilateral posterior element edema may be degenerative in nature given advanced L4-5 facet arthritis. A 12 mm T1 and T2 hypointense focus anteriorly in the T12 vertebral body corresponds to an area of sclerosis on CT and was without significant hypermetabolism reported on PET. There is a small amount of surrounding STIR hyperintensity and enhancement, and there is a similar but smaller area along the T11 inferior endplate measuring 8 mm. The T11 and T12 lesions are associated with anterior vertebral osteophyte formation and are favored to be degenerative. Conus medullaris and cauda equina: Conus extends to the L1 level. Conus and cauda equina appear normal. Paraspinal and other soft tissues: Partial visualization of multiple uterine masses as well as of a mixed cystic and solid right adnexal mass posterior to the uterus measuring approximately 4.1 x 2.7 cm. Disc levels: Disc desiccation throughout the lumbar and lower thoracic spine. Severe disc space narrowing anteriorly at T11-12 with moderate narrowing at T10-11 and L4-5. T11-12: Only imaged sagittally. Disc bulging without evidence of significant stenosis or spinal cord mass effect. T12-L1: Negative. L1-2: Negative. L2-3: Minimal disc bulging without stenosis. L3-4: Minimal disc bulging without stenosis. L4-5: Anterolisthesis with bulging uncovered disc, mild ligamentum flavum hypertrophy, and severe facet  arthrosis result in mild bilateral lateral recess stenosis and mild-to-moderate bilateral neural foraminal stenosis. The facet joints are gaping with large effusions which can be seen in the setting of instability. Small subchondral cysts are associated with the right facet joint, and there is a small synovial cyst posterior to the right facet joint. L5-S1: Circumferential disc bulging and moderate facet arthrosis result in moderate bilateral neural foraminal stenosis without spinal stenosis. There are small facet joint effusions. IMPRESSION: 1. Diffuse marrow replacement in the L5 vertebral body, indeterminate though metastatic disease is a concern. Correlate  with upcoming biopsy. No extraosseous tumor or pathologic fracture. 2. Marrow signal abnormality on both sides of the T11-12 disc space favored to be degenerative. 3. Severe L4-5 facet arthrosis with grade 1 anterolisthesis, mild bilateral lateral recess stenosis, and mild-to-moderate bilateral neural foraminal stenosis. Findings may worsen with standing. 4. Moderate bilateral neural foraminal stenosis at L5-S1. 5. Partially visualized uterine masses which may reflect a combination of known adenocarcinoma and fibroids. 4 cm right adnexal mass may reflect an exophytic subserosal fibroid or ovarian neoplasm. Electronically Signed   By: Logan Bores M.D.   On: 12/20/2017 09:09   Ir Fluoro Guide Ndl Plmt / Bx  Result Date: 12/22/2017 CLINICAL DATA:  History of cervical carcinoma. Hypermetabolic L5 vertebral body lesion. EXAM: DEEP CORE L5 VERTEBRAL BODY BONE UNDER FLUOROSCOPY MEDICATIONS: None indicated ANESTHESIA/SEDATION: Intravenous Fentanyl and Versed were administered as conscious sedation during continuous monitoring of the patient's level of consciousness and physiological / cardiorespiratory status by the radiology RN, with a total moderate sedation time of 22 minutes. PROCEDURE: Informed written consent was obtained from the patient after a thorough  discussion of the procedural risks, benefits and alternatives. All questions were addressed. Maximal Sterile Barrier Technique was utilized including caps, mask, sterile gowns, sterile gloves, sterile drape, hand hygiene and skin antiseptic. A timeout was performed prior to the initiation of the procedure. Patient placed prone. Lumbar region prepped with Betadine, draped in usual sterile fashion. After local infiltration with 1% lidocaine, a Kyphon trocar bone needle advanced to the posterior margin of the L5 vertebral body using a left transpedicular approach. This was exchanged over pin for the working needle, advanced into the posterior aspect of the L5 vertebral body. Needle position confirmed on biplane imaging. Coaxial core biopsy samples x2 obtained, placed in formalin. The access needle was removed. The patient tolerated the procedure well. FLUOROSCOPY TIME:  2.5 minutes; 1610 uGym2 DAP COMPLICATIONS: None immediate. IMPRESSION: 1. Technically successful L5 vertebral body deep bone core biopsy under fluoroscopy Electronically Signed   By: Lucrezia Europe M.D.   On: 12/22/2017 08:39   Ir Imaging Guided Port Insertion  Result Date: 12/23/2017 INDICATION: 67 year old female with cervical cancer in need of durable venous access for chemotherapy. EXAM: IMPLANTED PORT A CATH PLACEMENT WITH ULTRASOUND AND FLUOROSCOPIC GUIDANCE MEDICATIONS: 2 g Ancef; The antibiotic was administered within an appropriate time interval prior to skin puncture. ANESTHESIA/SEDATION: Versed 2 mg IV; Fentanyl 100 mcg IV; Moderate Sedation Time:  21 minutes The patient was continuously monitored during the procedure by the interventional radiology nurse under my direct supervision. FLUOROSCOPY TIME:  0 minutes, 18 seconds (4 mGy) COMPLICATIONS: None immediate. PROCEDURE: The right neck and chest was prepped with chlorhexidine, and draped in the usual sterile fashion using maximum barrier technique (cap and mask, sterile gown, sterile  gloves, large sterile sheet, hand hygiene and cutaneous antiseptic). Antibiotic prophylaxis was provided with 2g Ancef administered IV one hour prior to skin incision. Local anesthesia was attained by infiltration with 1% lidocaine with epinephrine. Ultrasound demonstrated patency of the right internal jugular vein, and this was documented with an image. Under real-time ultrasound guidance, this vein was accessed with a 21 gauge micropuncture needle and image documentation was performed. A small dermatotomy was made at the access site with an 11 scalpel. A 0.018" wire was advanced into the SVC and the access needle exchanged for a 32F micropuncture vascular sheath. The 0.018" wire was then removed and a 0.035" wire advanced into the IVC. An appropriate location for the subcutaneous  reservoir was selected below the clavicle and an incision was made through the skin and underlying soft tissues. The subcutaneous tissues were then dissected using a combination of blunt and sharp surgical technique and a pocket was formed. A single lumen power injectable portacatheter was then tunneled through the subcutaneous tissues from the pocket to the dermatotomy and the port reservoir placed within the subcutaneous pocket. The venous access site was then serially dilated and a peel away vascular sheath placed over the wire. The wire was removed and the port catheter advanced into position under fluoroscopic guidance. The catheter tip is positioned in the upper right atrium. This was documented with a spot image. The portacatheter was then tested and found to flush and aspirate well. The port was flushed with saline followed by 100 units/mL heparinized saline. The pocket was then closed in two layers using first subdermal inverted interrupted absorbable sutures followed by a running subcuticular suture. The epidermis was then sealed with Dermabond. The dermatotomy at the venous access site was also closed with a single inverted  subdermal suture and the epidermis sealed with Dermabond. IMPRESSION: Successful placement of a right IJ approach Power Port with ultrasound and fluoroscopic guidance. The catheter is ready for use. Electronically Signed   By: Jacqulynn Cadet M.D.   On: 12/23/2017 14:47    All questions were answered. The patient knows to call the clinic with any problems, questions or concerns. No barriers to learning was detected.  I spent 25 minutes counseling the patient face to face. The total time spent in the appointment was 30 minutes and more than 50% was on counseling and review of test results  Heath Lark, MD 01/17/2018 11:31 AM

## 2018-01-17 NOTE — Telephone Encounter (Signed)
Per 12/10 los, return for no new orders. °

## 2018-01-17 NOTE — Assessment & Plan Note (Signed)
She is currently not symptomatic.  She will receive palliative radiation therapy to her bone

## 2018-01-17 NOTE — Assessment & Plan Note (Addendum)
I have extensive discussion about her case with GYN oncologist, gastroenterologist and radiation oncologist. 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  Based on additional comment from pathologist, the bone biopsy appears to be originating from her GYN malignancy and not from her colon.  I have also discussed the case with a colleague of mine in regards to management of her colon cancer In summary, we will proceed with concurrent chemoradiation therapy for cervical cancer with weekly cisplatin I plan to repeat imaging study with PET CT scan within the month of completion of radiation therapy to assess response to treatment.  She may benefit from further chemotherapy sooner than later due to her advanced stage disease.

## 2018-01-17 NOTE — Assessment & Plan Note (Signed)
Her blood pressure is not elevated I recommend discontinuation of enalapril due to risk of renal failure

## 2018-01-17 NOTE — Progress Notes (Signed)
Encounter error

## 2018-01-17 NOTE — Assessment & Plan Note (Signed)
She has what appears to be early stage colon cancer.  Given the scope of things and her concurrent stage IV diagnosis of metastatic cervical cancer, we will delay surgery for colon cancer right now and will monitor closely for signs and symptoms of bowel obstruction.

## 2018-01-18 ENCOUNTER — Ambulatory Visit
Admission: RE | Admit: 2018-01-18 | Discharge: 2018-01-18 | Disposition: A | Discharge status: Home / Self Care | Payer: BC Managed Care – PPO | Source: Ambulatory Visit | Attending: Radiation Oncology | Admitting: Radiation Oncology

## 2018-01-18 ENCOUNTER — Inpatient Hospital Stay: Payer: BC Managed Care – PPO

## 2018-01-18 VITALS — BP 136/78 | HR 64 | Temp 98.5°F | Resp 16

## 2018-01-18 DIAGNOSIS — C530 Malignant neoplasm of endocervix: Secondary | ICD-10-CM

## 2018-01-18 DIAGNOSIS — Z5111 Encounter for antineoplastic chemotherapy: Secondary | ICD-10-CM | POA: Diagnosis not present

## 2018-01-18 MED ORDER — POTASSIUM CHLORIDE 2 MEQ/ML IV SOLN
Freq: Once | INTRAVENOUS | Status: AC
  Administered 2018-01-18: 10:00:00 via INTRAVENOUS
  Filled 2018-01-18: qty 10

## 2018-01-18 MED ORDER — SODIUM CHLORIDE 0.9 % IV SOLN
40.0000 mg/m2 | Freq: Once | INTRAVENOUS | Status: AC
  Administered 2018-01-18: 76 mg via INTRAVENOUS
  Filled 2018-01-18: qty 76

## 2018-01-18 MED ORDER — PALONOSETRON HCL INJECTION 0.25 MG/5ML
0.2500 mg | Freq: Once | INTRAVENOUS | Status: AC
  Administered 2018-01-18: 0.25 mg via INTRAVENOUS

## 2018-01-18 MED ORDER — SODIUM CHLORIDE 0.9 % IV SOLN
Freq: Once | INTRAVENOUS | Status: AC
  Administered 2018-01-18: 12:00:00 via INTRAVENOUS
  Filled 2018-01-18: qty 5

## 2018-01-18 MED ORDER — SODIUM CHLORIDE 0.9 % IV SOLN
Freq: Once | INTRAVENOUS | Status: AC
  Administered 2018-01-18: 10:00:00 via INTRAVENOUS
  Filled 2018-01-18: qty 250

## 2018-01-18 MED ORDER — PALONOSETRON HCL INJECTION 0.25 MG/5ML
INTRAVENOUS | Status: AC
  Filled 2018-01-18: qty 5

## 2018-01-18 MED ORDER — HEPARIN SOD (PORK) LOCK FLUSH 100 UNIT/ML IV SOLN
500.0000 [IU] | Freq: Once | INTRAVENOUS | Status: AC | PRN
  Administered 2018-01-18: 500 [IU]
  Filled 2018-01-18: qty 5

## 2018-01-18 MED ORDER — SODIUM CHLORIDE 0.9% FLUSH
10.0000 mL | INTRAVENOUS | Status: DC | PRN
  Administered 2018-01-18: 10 mL
  Filled 2018-01-18: qty 10

## 2018-01-18 NOTE — Patient Instructions (Addendum)
[facility] Discharge Instructions for Patients Receiving Chemotherapy  Today you received the following chemotherapy agents: Cisplatin.   To help prevent nausea and vomiting after your treatment, we encourage you to take your nausea medication as directed.  If you develop nausea and vomiting that is not controlled by your nausea medication, call the clinic.   BELOW ARE SYMPTOMS THAT SHOULD BE REPORTED IMMEDIATELY:  *FEVER GREATER THAN 100.5 F  *CHILLS WITH OR WITHOUT FEVER  NAUSEA AND VOMITING THAT IS NOT CONTROLLED WITH YOUR NAUSEA MEDICATION  *UNUSUAL SHORTNESS OF BREATH  *UNUSUAL BRUISING OR BLEEDING  TENDERNESS IN MOUTH AND THROAT WITH OR WITHOUT PRESENCE OF ULCERS  *URINARY PROBLEMS  *BOWEL PROBLEMS  UNUSUAL RASH Items with * indicate a potential emergency and should be followed up as soon as possible.  Feel free to call the clinic should you have any questions or concerns. The clinic phone number is [phone].  Please show the CHEMO ALERT CARD at check-in to the Emergency Department and triage nurse.   Cisplatin injection What is this medicine? CISPLATIN (SIS pla tin) is a chemotherapy drug. It targets fast dividing cells, like cancer cells, and causes these cells to die. This medicine is used to treat many types of cancer like bladder, ovarian, and testicular cancers. This medicine may be used for other purposes; ask your health care provider or pharmacist if you have questions. COMMON BRAND NAME(S): Platinol, Platinol -AQ What should I tell my health care provider before I take this medicine? They need to know if you have any of these conditions: -blood disorders -hearing problems -kidney disease -recent or ongoing radiation therapy -an unusual or allergic reaction to cisplatin, carboplatin, other chemotherapy, other medicines, foods, dyes, or preservatives -pregnant or trying to get pregnant -breast-feeding How should I use this  medicine? This drug is given as an infusion into a vein. It is administered in a hospital or clinic by a specially trained health care professional. Talk to your pediatrician regarding the use of this medicine in children. Special care may be needed. Overdosage: If you think you have taken too much of this medicine contact a poison control center or emergency room at once. NOTE: This medicine is only for you. Do not share this medicine with others. What if I miss a dose? It is important not to miss a dose. Call your doctor or health care professional if you are unable to keep an appointment. What may interact with this medicine? -dofetilide -foscarnet -medicines for seizures -medicines to increase blood counts like filgrastim, pegfilgrastim, sargramostim -probenecid -pyridoxine used with altretamine -rituximab -some antibiotics like amikacin, gentamicin, neomycin, polymyxin B, streptomycin, tobramycin -sulfinpyrazone -vaccines -zalcitabine Talk to your doctor or health care professional before taking any of these medicines: -acetaminophen -aspirin -ibuprofen -ketoprofen -naproxen This list may not describe all possible interactions. Give your health care provider a list of all the medicines, herbs, non-prescription drugs, or dietary supplements you use. Also tell them if you smoke, drink alcohol, or use illegal drugs. Some items may interact with your medicine. What should I watch for while using this medicine? Your condition will be monitored carefully while you are receiving this medicine. You will need important blood work done while you are taking this medicine. This drug may make you feel generally unwell. This is not uncommon, as chemotherapy can affect healthy cells as well as cancer cells. Report any side effects. Continue your course of treatment even though you feel ill unless your doctor tells you to   stop. In some cases, you may be given additional medicines to help with side  effects. Follow all directions for their use. Call your doctor or health care professional for advice if you get a fever, chills or sore throat, or other symptoms of a cold or flu. Do not treat yourself. This drug decreases your body's ability to fight infections. Try to avoid being around people who are sick. This medicine may increase your risk to bruise or bleed. Call your doctor or health care professional if you notice any unusual bleeding. Be careful brushing and flossing your teeth or using a toothpick because you may get an infection or bleed more easily. If you have any dental work done, tell your dentist you are receiving this medicine. Avoid taking products that contain aspirin, acetaminophen, ibuprofen, naproxen, or ketoprofen unless instructed by your doctor. These medicines may hide a fever. Do not become pregnant while taking this medicine. Women should inform their doctor if they wish to become pregnant or think they might be pregnant. There is a potential for serious side effects to an unborn child. Talk to your health care professional or pharmacist for more information. Do not breast-feed an infant while taking this medicine. Drink fluids as directed while you are taking this medicine. This will help protect your kidneys. Call your doctor or health care professional if you get diarrhea. Do not treat yourself. What side effects may I notice from receiving this medicine? Side effects that you should report to your doctor or health care professional as soon as possible: -allergic reactions like skin rash, itching or hives, swelling of the face, lips, or tongue -signs of infection - fever or chills, cough, sore throat, pain or difficulty passing urine -signs of decreased platelets or bleeding - bruising, pinpoint red spots on the skin, black, tarry stools, nosebleeds -signs of decreased red blood cells - unusually weak or tired, fainting spells, lightheadedness -breathing  problems -changes in hearing -gout pain -low blood counts - This drug may decrease the number of white blood cells, red blood cells and platelets. You may be at increased risk for infections and bleeding. -nausea and vomiting -pain, swelling, redness or irritation at the injection site -pain, tingling, numbness in the hands or feet -problems with balance, movement -trouble passing urine or change in the amount of urine Side effects that usually do not require medical attention (report to your doctor or health care professional if they continue or are bothersome): -changes in vision -loss of appetite -metallic taste in the mouth or changes in taste This list may not describe all possible side effects. Call your doctor for medical advice about side effects. You may report side effects to FDA at 1-800-FDA-1088. Where should I keep my medicine? This drug is given in a hospital or clinic and will not be stored at home. NOTE: This sheet is a summary. It may not cover all possible information. If you have questions about this medicine, talk to your doctor, pharmacist, or health care provider.  2018 Elsevier/Gold Standard (2007-05-02 14:40:54)   

## 2018-01-19 ENCOUNTER — Ambulatory Visit
Admission: RE | Admit: 2018-01-19 | Discharge: 2018-01-19 | Disposition: A | Discharge status: Home / Self Care | Payer: BC Managed Care – PPO | Source: Ambulatory Visit | Attending: Radiation Oncology | Admitting: Radiation Oncology

## 2018-01-19 ENCOUNTER — Telehealth: Payer: Self-pay

## 2018-01-19 DIAGNOSIS — Z5111 Encounter for antineoplastic chemotherapy: Secondary | ICD-10-CM | POA: Diagnosis not present

## 2018-01-19 NOTE — Telephone Encounter (Signed)
Called to follow up after first treatment. She is doing well. Eating and drinking with no problems. She felt a little bit of nausea last night and took compazine. Instructed to call office if needed. She verbalized understanding.

## 2018-01-19 NOTE — Telephone Encounter (Signed)
-----   Message from Sinda Du, RN sent at 01/18/2018  9:39 AM EST ----- Regarding: Dr. Alvy Bimler - 1st chemo f/u 1st chemo f/u

## 2018-01-20 ENCOUNTER — Ambulatory Visit
Admission: RE | Admit: 2018-01-20 | Discharge: 2018-01-20 | Disposition: A | Discharge status: Home / Self Care | Payer: BC Managed Care – PPO | Source: Ambulatory Visit | Attending: Radiation Oncology | Admitting: Radiation Oncology

## 2018-01-20 DIAGNOSIS — Z5111 Encounter for antineoplastic chemotherapy: Secondary | ICD-10-CM | POA: Diagnosis not present

## 2018-01-23 ENCOUNTER — Inpatient Hospital Stay: Payer: BC Managed Care – PPO

## 2018-01-23 ENCOUNTER — Encounter: Payer: Self-pay | Admitting: Gynecologic Oncology

## 2018-01-23 ENCOUNTER — Ambulatory Visit
Admission: RE | Admit: 2018-01-23 | Discharge: 2018-01-23 | Disposition: A | Discharge status: Home / Self Care | Payer: BC Managed Care – PPO | Source: Ambulatory Visit | Attending: Radiation Oncology | Admitting: Radiation Oncology

## 2018-01-23 DIAGNOSIS — Z5111 Encounter for antineoplastic chemotherapy: Secondary | ICD-10-CM | POA: Diagnosis not present

## 2018-01-23 DIAGNOSIS — C539 Malignant neoplasm of cervix uteri, unspecified: Secondary | ICD-10-CM

## 2018-01-23 DIAGNOSIS — C7951 Secondary malignant neoplasm of bone: Secondary | ICD-10-CM

## 2018-01-23 LAB — COMPREHENSIVE METABOLIC PANEL
ALT: 26 U/L (ref 0–44)
ANION GAP: 8 (ref 5–15)
AST: 14 U/L — ABNORMAL LOW (ref 15–41)
Albumin: 3 g/dL — ABNORMAL LOW (ref 3.5–5.0)
Alkaline Phosphatase: 74 U/L (ref 38–126)
BUN: 21 mg/dL (ref 8–23)
CHLORIDE: 103 mmol/L (ref 98–111)
CO2: 26 mmol/L (ref 22–32)
Calcium: 9.1 mg/dL (ref 8.9–10.3)
Creatinine, Ser: 0.8 mg/dL (ref 0.44–1.00)
GFR calc Af Amer: >60 mL/min (ref >60)
GFR calc non Af Amer: >60 mL/min (ref >60)
Glucose, Bld: 103 mg/dL — ABNORMAL HIGH (ref 70–99)
Potassium: 4 mmol/L (ref 3.5–5.1)
Sodium: 137 mmol/L (ref 135–145)
Total Bilirubin: 0.4 mg/dL (ref 0.3–1.2)
Total Protein: 6.3 g/dL — ABNORMAL LOW (ref 6.5–8.1)

## 2018-01-23 LAB — CBC WITH DIFFERENTIAL/PLATELET
Abs Immature Granulocytes: 0.1 10*3/uL — ABNORMAL HIGH (ref 0.00–0.07)
Basophils Absolute: 0 10*3/uL (ref 0.0–0.1)
Basophils Relative: 0 %
EOS PCT: 1 %
Eosinophils Absolute: 0 10*3/uL (ref 0.0–0.5)
HCT: 32.5 % — ABNORMAL LOW (ref 36.0–46.0)
Hemoglobin: 10.9 g/dL — ABNORMAL LOW (ref 12.0–15.0)
Immature Granulocytes: 2 %
Lymphocytes Relative: 18 %
Lymphs Abs: 0.7 10*3/uL (ref 0.7–4.0)
MCH: 29.5 pg (ref 26.0–34.0)
MCHC: 33.5 g/dL (ref 30.0–36.0)
MCV: 87.8 fL (ref 80.0–100.0)
Monocytes Absolute: 0.3 10*3/uL (ref 0.1–1.0)
Monocytes Relative: 6 %
Neutro Abs: 3 10*3/uL (ref 1.7–7.7)
Neutrophils Relative %: 73 %
PLATELETS: 385 10*3/uL (ref 150–400)
RBC: 3.7 MIL/uL — ABNORMAL LOW (ref 3.87–5.11)
RDW: 14.6 % (ref 11.5–15.5)
WBC: 4.1 10*3/uL (ref 4.0–10.5)
nRBC: 0.5 % — ABNORMAL HIGH (ref 0.0–0.2)

## 2018-01-23 LAB — MAGNESIUM: Magnesium: 2 mg/dL (ref 1.7–2.4)

## 2018-01-23 MED ORDER — SODIUM CHLORIDE 0.9% FLUSH
10.0000 mL | Freq: Once | INTRAVENOUS | Status: AC
  Administered 2018-01-23: 10 mL
  Filled 2018-01-23: qty 10

## 2018-01-23 MED ORDER — HEPARIN SOD (PORK) LOCK FLUSH 100 UNIT/ML IV SOLN
250.0000 [IU] | Freq: Once | INTRAVENOUS | Status: AC
  Administered 2018-01-23: 250 [IU]
  Filled 2018-01-23: qty 5

## 2018-01-24 ENCOUNTER — Telehealth: Payer: Self-pay | Admitting: Hematology and Oncology

## 2018-01-24 ENCOUNTER — Encounter: Payer: Self-pay | Admitting: Hematology and Oncology

## 2018-01-24 ENCOUNTER — Ambulatory Visit
Admission: RE | Admit: 2018-01-24 | Discharge: 2018-01-24 | Disposition: A | Discharge status: Home / Self Care | Payer: BC Managed Care – PPO | Source: Ambulatory Visit | Attending: Radiation Oncology | Admitting: Radiation Oncology

## 2018-01-24 ENCOUNTER — Inpatient Hospital Stay (HOSPITAL_BASED_OUTPATIENT_CLINIC_OR_DEPARTMENT_OTHER): Payer: BC Managed Care – PPO | Admitting: Hematology and Oncology

## 2018-01-24 DIAGNOSIS — Z5111 Encounter for antineoplastic chemotherapy: Secondary | ICD-10-CM | POA: Diagnosis not present

## 2018-01-24 DIAGNOSIS — D6481 Anemia due to antineoplastic chemotherapy: Secondary | ICD-10-CM | POA: Diagnosis not present

## 2018-01-24 DIAGNOSIS — C539 Malignant neoplasm of cervix uteri, unspecified: Secondary | ICD-10-CM

## 2018-01-24 DIAGNOSIS — C530 Malignant neoplasm of endocervix: Secondary | ICD-10-CM

## 2018-01-24 DIAGNOSIS — C7951 Secondary malignant neoplasm of bone: Secondary | ICD-10-CM | POA: Diagnosis not present

## 2018-01-24 DIAGNOSIS — T451X5A Adverse effect of antineoplastic and immunosuppressive drugs, initial encounter: Secondary | ICD-10-CM | POA: Insufficient documentation

## 2018-01-24 NOTE — Progress Notes (Signed)
[facility] OFFICE PROGRESS NOTE  Patient Care Team: Antony Contras, MD as PCP - General (Family Medicine) Minus Breeding, MD as PCP - Cardiology (Cardiology)  ASSESSMENT & PLAN:  Cervical cancer [facility]) Her case was reviewed at the tumor board yesterday The plan will be to continue concurrent chemoradiation therapy with cisplatin weekly She will get repeat imaging study within 4 to 6 weeks after radiation therapy is completed to monitor the status of her response to treatment So far, she tolerated weekly chemotherapy well without major side effects  Metastasis to bone [facility]) She is currently not symptomatic.  She will receive palliative radiation therapy to her bone  Anemia due to antineoplastic chemotherapy This is likely due to recent treatment. The patient denies recent history of bleeding such as epistaxis, hematuria or hematochezia. She is asymptomatic from the anemia. I will observe for now.    No orders of the defined types were placed in this encounter.   INTERVAL HISTORY: Please see below for problem oriented charting. She is seen prior to cycle 2 of chemotherapy She feels well No recent nausea or vomiting No new bone pain No recent infection, fever or chills  Denies peripheral neuropathy from treatment  SUMMARY OF ONCOLOGIC HISTORY: Oncology History   MSI - Stable on cervix biopsy PD-L1 2%  Bone biopsy is consistent with metastatic GYN primary     Cervical cancer ([place])   09/08/2017 Initial Diagnosis    Patient has a history of symptomatic endometrial fibroids in 2013 for which she underwent hysteroscopic resection with benign pathology.  She has had lifelong normal Pap smears including in 2019.  Of note HPV testing was not performed in this Pap in 2019.  She developed symptoms of very light vaginal spotting in August 2019     11/22/2017 Procedure    She was seen by her gynecologist, Dr. Radene Knee, who performed a pelvic examination.  On physical  examination and endocervical mass was appreciated and felt to be likely to be a polyp or fibroid was biopsied on 11/22/17.       11/22/2017 Pathology Results    This revealed poorly differentiated invasive carcinoma.  Immunostains revealed that it was CK7 positive, P 16+, p53 positive, PAX 8+ and CK 5 6 weakly positive.  CK 20, ER PR and p53 immunostains were negative.  The morphology and Immuno profile favored a gynecologic primary with endocervical and endometrial adenocarcinoma in the differential diagnosis.     11/22/2017 Imaging    An ultrasound scan on November 22, 2017 revealed several intramural fibroids.  With saline infusion there was a 1.8 cm density within the endometrial cavity which could be a fibroid or polyp.     12/08/2017 Imaging    PET:  1. Marked hypermetabolism involving the cervix and uterus. Difficult to be certain of the origin but favor cervix. No evidence of parametrial or parauterine tumor and no pelvic or retroperitoneal hypermetabolic lymphadenopathy. 2. L5 vertebral body lesion worrisome for metastasis. 3. Hypermetabolic area involving the hepatic flexure region of the colon is worrisome for colon cancer. Recommend colonoscopy. 4. Benign left adrenal gland adenoma. 5. Symmetric hypermetabolism in the tongue base and tonsillar regions is likely inflamed lymphoid tissue. No neck mass or adenopathy.    12/19/2017 Imaging    1. Diffuse marrow replacement in the L5 vertebral body, indeterminate though metastatic disease is a concern. Correlate with upcoming biopsy. No extraosseous tumor or pathologic fracture. 2. Marrow signal abnormality on both sides of the T11-12 disc space  favored to be degenerative. 3. Severe L4-5 facet arthrosis with grade 1 anterolisthesis, mild bilateral lateral recess stenosis, and mild-to-moderate bilateral neural foraminal stenosis. Findings may worsen with standing. 4. Moderate bilateral neural foraminal stenosis at L5-S1. 5. Partially  visualized uterine masses which may reflect a combination of known adenocarcinoma and fibroids. 4 cm right adnexal mass may reflect an exophytic subserosal fibroid or ovarian neoplasm.    12/20/2017 Echocardiogram    LV EF: 60% -   65%    12/23/2017 Procedure    Successful placement of a right IJ approach Power Port with ultrasound and fluoroscopic guidance. The catheter is ready for use.    12/23/2017 Pathology Results    Bone, biopsy, L5 - METASTATIC POORLY DIFFERENTIATED CARCINOMA. - SEE COMMENT. Microscopic Comment Dr. Vic Ripper has reviewed the case and concurs with this interpretation. The case was discussed wtih Joylene John on 12/22/2017. Per request, a block will be sent for PDL1 testing and the results reported separately    01/11/2018 Cancer Staging    Staging form: Cervix Uteri, AJCC 8th Edition - Clinical: FIGO Stage IVB (cT2, cN0, pM1) - Signed by Heath Lark, MD on 01/11/2018    01/17/2018 -  Chemotherapy    The patient had weekly cisplatin     Colon cancer ([place])   01/11/2018 Procedure    - Hemorrhoids found on perianal exam. - Malignant partially obstructing tumor at the hepatic flexure. Biopsied. Tattooed. - Two 2 to 5 mm polyps in the sigmoid colon, removed with a cold snare. Resected and retrieved. - Diverticulosis in the sigmoid colon, in the distal descending colon and in the ascending colon. - Non-bleeding internal hemorrhoids. - The examined portion of the ileum was normal.    01/11/2018 Pathology Results    1. Colon, biopsy, hepatic flexure - ADENOCARCINOMA, SEE COMMENT. 2. Colon, polyp(s), sigmoid, polyp (2) - HYPERPLASTIC POLYP (X2 FRAGMENTS). - NO DYSPLASIA OR MALIGNANCY. Microscopic Comment 1. There are additional fragments of tubular adenoma suggesting this is a primary colon adenocarcinoma.      REVIEW OF SYSTEMS:   Constitutional: Denies fevers, chills or abnormal weight loss Eyes: Denies blurriness of vision Ears, nose, mouth, throat, and  face: Denies mucositis or sore throat Respiratory: Denies cough, dyspnea or wheezes Cardiovascular: Denies palpitation, chest discomfort or lower extremity swelling Gastrointestinal:  Denies nausea, heartburn or change in bowel habits Skin: Denies abnormal skin rashes Lymphatics: Denies new lymphadenopathy or easy bruising Neurological:Denies numbness, tingling or new weaknesses Behavioral/Psych: Mood is stable, no new changes  All other systems were reviewed with the patient and are negative.  I have reviewed the past medical history, past surgical history, social history and family history with the patient and they are unchanged from previous note.  ALLERGIES:  is allergic to clarithromycin; dilacor [diltiazem hcl]; and norvasc [amlodipine besylate].  MEDICATIONS:  Current Outpatient Medications  Medication Sig Dispense Refill  . amLODipine (NORVASC) 10 MG tablet Take 10 mg by mouth daily.     Marland Kitchen atenolol (TENORMIN) 50 MG tablet Take 50 mg by mouth daily.  1  . Cholecalciferol (VITAMIN D3) 5000 units CAPS Take 5,000 Units by mouth daily.     Marland Kitchen dexamethasone (DECADRON) 4 MG tablet Take 2 tablets by mouth once a day on the day after chemotherapy and then take 2 tablets two times a day for 2 days. Take with food. (Patient not taking: Reported on 01/11/2018) 30 tablet 1  . fish oil-omega-3 fatty acids 1000 MG capsule Take 1 g by mouth daily.     Marland Kitchen  lidocaine-prilocaine (EMLA) cream Apply to affected area once 30 g 3  . LORazepam (ATIVAN) 0.5 MG tablet Take 1 tablet (0.5 mg total) by mouth every 6 (six) hours as needed (Nausea or vomiting). (Patient not taking: Reported on 01/11/2018) 30 tablet 0  . Multiple Vitamin (MULTIVITAMIN WITH MINERALS) TABS tablet Take 1 tablet by mouth daily.    . ondansetron (ZOFRAN) 8 MG tablet Take 1 tablet (8 mg total) by mouth 2 (two) times daily as needed. Start on the third day after chemotherapy. (Patient not taking: Reported on 01/11/2018) 30 tablet 1  .  prochlorperazine (COMPAZINE) 10 MG tablet Take 1 tablet (10 mg total) by mouth every 6 (six) hours as needed (Nausea or vomiting). (Patient not taking: Reported on 01/11/2018) 30 tablet 1  . spironolactone (ALDACTONE) 25 MG tablet Take 1 tablet (25 mg total) by mouth daily. 90 tablet 3   No current facility-administered medications for this visit.     PHYSICAL EXAMINATION: ECOG PERFORMANCE STATUS: 0 - Asymptomatic  Vitals:   01/24/18 1133  BP: (!) 143/78  Pulse: 63  Resp: 18  Temp: 98.6 F (37 C)  SpO2: 100%   Filed Weights   01/24/18 1133  Weight: 179 lb (81.2 kg)    GENERAL:alert, no distress and comfortable SKIN: skin color, texture, turgor are normal, no rashes or significant lesions EYES: normal, Conjunctiva are pink and non-injected, sclera clear OROPHARYNX:no exudate, no erythema and lips, buccal mucosa, and tongue normal  NECK: supple, thyroid normal size, non-tender, without nodularity LYMPH:  no palpable lymphadenopathy in the cervical, axillary or inguinal LUNGS: clear to auscultation and percussion with normal breathing effort HEART: regular rate & rhythm and no murmurs and no lower extremity edema ABDOMEN:abdomen soft, non-tender and normal bowel sounds Musculoskeletal:no cyanosis of digits and no clubbing  NEURO: alert & oriented x 3 with fluent speech, no focal motor/sensory deficits  LABORATORY DATA:  I have reviewed the data as listed    Component Value Date/Time   NA 137 01/23/2018 1151   K 4.0 01/23/2018 1151   CL 103 01/23/2018 1151   CO2 26 01/23/2018 1151   GLUCOSE 103 (H) 01/23/2018 1151   BUN 21 01/23/2018 1151   CREATININE 0.80 01/23/2018 1151   CREATININE 0.69 01/10/2018 1023   CALCIUM 9.1 01/23/2018 1151   PROT 6.3 (L) 01/23/2018 1151   ALBUMIN 3.0 (L) 01/23/2018 1151   AST 14 (L) 01/23/2018 1151   AST 12 (L) 12/28/2017 1333   ALT 26 01/23/2018 1151   ALT 13 12/28/2017 1333   ALKPHOS 74 01/23/2018 1151   BILITOT 0.4 01/23/2018 1151    BILITOT 0.4 12/28/2017 1333   GFRNONAA >60 01/23/2018 1151   GFRNONAA >60 01/10/2018 1023   GFRAA >60 01/23/2018 1151   GFRAA >60 01/10/2018 1023    No results found for: SPEP, UPEP  Lab Results  Component Value Date   WBC 4.1 01/23/2018   NEUTROABS 3.0 01/23/2018   HGB 10.9 (L) 01/23/2018   HCT 32.5 (L) 01/23/2018   MCV 87.8 01/23/2018   PLT 385 01/23/2018      Chemistry      Component Value Date/Time   NA 137 01/23/2018 1151   K 4.0 01/23/2018 1151   CL 103 01/23/2018 1151   CO2 26 01/23/2018 1151   BUN 21 01/23/2018 1151   CREATININE 0.80 01/23/2018 1151   CREATININE 0.69 01/10/2018 1023      Component Value Date/Time   CALCIUM 9.1 01/23/2018 1151   ALKPHOS 74 01/23/2018  1151   AST 14 (L) 01/23/2018 1151   AST 12 (L) 12/28/2017 1333   ALT 26 01/23/2018 1151   ALT 13 12/28/2017 1333   BILITOT 0.4 01/23/2018 1151   BILITOT 0.4 12/28/2017 1333      All questions were answered. The patient knows to call the clinic with any problems, questions or concerns. No barriers to learning was detected.  I spent 10 minutes counseling the patient face to face. The total time spent in the appointment was 15 minutes and more than 50% was on counseling and review of test results  Heath Lark, MD 01/24/2018 11:39 AM

## 2018-01-24 NOTE — Assessment & Plan Note (Signed)
This is likely due to recent treatment. The patient denies recent history of bleeding such as epistaxis, hematuria or hematochezia. She is asymptomatic from the anemia. I will observe for now.   

## 2018-01-24 NOTE — Telephone Encounter (Signed)
Per 12/17 los, return for no new orders.

## 2018-01-24 NOTE — Assessment & Plan Note (Signed)
Her case was reviewed at the tumor board yesterday The plan will be to continue concurrent chemoradiation therapy with cisplatin weekly She will get repeat imaging study within 4 to 6 weeks after radiation therapy is completed to monitor the status of her response to treatment So far, she tolerated weekly chemotherapy well without major side effects

## 2018-01-24 NOTE — Assessment & Plan Note (Signed)
She is currently not symptomatic.  She will receive palliative radiation therapy to her bone

## 2018-01-25 ENCOUNTER — Inpatient Hospital Stay: Payer: BC Managed Care – PPO

## 2018-01-25 ENCOUNTER — Ambulatory Visit
Admission: RE | Admit: 2018-01-25 | Discharge: 2018-01-25 | Disposition: A | Discharge status: Home / Self Care | Payer: BC Managed Care – PPO | Source: Ambulatory Visit | Attending: Radiation Oncology | Admitting: Radiation Oncology

## 2018-01-25 ENCOUNTER — Inpatient Hospital Stay: Payer: BC Managed Care – PPO | Admitting: Nutrition

## 2018-01-25 VITALS — BP 142/74 | HR 61 | Temp 98.3°F | Resp 14

## 2018-01-25 DIAGNOSIS — C530 Malignant neoplasm of endocervix: Secondary | ICD-10-CM

## 2018-01-25 DIAGNOSIS — Z5111 Encounter for antineoplastic chemotherapy: Secondary | ICD-10-CM | POA: Diagnosis not present

## 2018-01-25 MED ORDER — SODIUM CHLORIDE 0.9 % IV SOLN
Freq: Once | INTRAVENOUS | Status: AC
  Administered 2018-01-25: 11:00:00 via INTRAVENOUS
  Filled 2018-01-25: qty 5

## 2018-01-25 MED ORDER — POTASSIUM CHLORIDE 2 MEQ/ML IV SOLN
Freq: Once | INTRAVENOUS | Status: AC
  Administered 2018-01-25: 09:00:00 via INTRAVENOUS
  Filled 2018-01-25: qty 10

## 2018-01-25 MED ORDER — PALONOSETRON HCL INJECTION 0.25 MG/5ML
0.2500 mg | Freq: Once | INTRAVENOUS | Status: AC
  Administered 2018-01-25: 0.25 mg via INTRAVENOUS

## 2018-01-25 MED ORDER — SODIUM CHLORIDE 0.9% FLUSH
10.0000 mL | INTRAVENOUS | Status: DC | PRN
  Administered 2018-01-25: 10 mL
  Filled 2018-01-25: qty 10

## 2018-01-25 MED ORDER — HEPARIN SOD (PORK) LOCK FLUSH 100 UNIT/ML IV SOLN
500.0000 [IU] | Freq: Once | INTRAVENOUS | Status: AC | PRN
  Administered 2018-01-25: 500 [IU]
  Filled 2018-01-25: qty 5

## 2018-01-25 MED ORDER — SODIUM CHLORIDE 0.9 % IV SOLN
40.0000 mg/m2 | Freq: Once | INTRAVENOUS | Status: AC
  Administered 2018-01-25: 76 mg via INTRAVENOUS
  Filled 2018-01-25: qty 76

## 2018-01-25 MED ORDER — PALONOSETRON HCL INJECTION 0.25 MG/5ML
INTRAVENOUS | Status: AC
  Filled 2018-01-25: qty 5

## 2018-01-25 MED ORDER — SODIUM CHLORIDE 0.9 % IV SOLN
Freq: Once | INTRAVENOUS | Status: AC
  Administered 2018-01-25: 09:00:00 via INTRAVENOUS
  Filled 2018-01-25: qty 250

## 2018-01-25 NOTE — Progress Notes (Signed)
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 Patient is receiving concurrent chemoradiation therapy. Patient reports that her appetite has increased. Noted weight decreased to 179 pounds December 17 down from 182 pounds December 4.  Patient concerned because she says she is eating a lot of food. She reports that she had bowel issues after eating cheese.  She denies lactose intolerance. She has not tried oral nutrition supplements. She denies nausea and vomiting.   Noted labs: glucose 103 and albumin 3.0.  Nutrition diagnosis: Food and nutrition related knowledge deficit continues.  Intervention: I reviewed strategies for increasing calories and protein and small frequent meals and snacks. Recommended patient try oral nutrition supplements 1-2 daily.  Provided coupons. Reviewed strategies for a bowel regimen. Encouraged weight maintenance. Teach back method used.  Monitoring, evaluation, goals: Patient will tolerate adequate calories and protein to promote weight stabilization.  Next visit: Thursday, January 2 during infusion.  **Disclaimer: This note was dictated with voice recognition software. Similar sounding words can inadvertently be transcribed and this note may contain transcription errors which may not have been corrected upon publication of note.** '

## 2018-01-25 NOTE — Patient Instructions (Signed)
[facility] Discharge Instructions for Patients Receiving Chemotherapy  Today you received the following chemotherapy agents: Cisplatin (Platinol)  To help prevent nausea and vomiting after your treatment, we encourage you to take your nausea medication as directed. Received Aloxi during treatment today-->Take Compazine (not Zofran) for the next 3 days as needed.    If you develop nausea and vomiting that is not controlled by your nausea medication, call the clinic.   BELOW ARE SYMPTOMS THAT SHOULD BE REPORTED IMMEDIATELY:  *FEVER GREATER THAN 100.5 F  *CHILLS WITH OR WITHOUT FEVER  NAUSEA AND VOMITING THAT IS NOT CONTROLLED WITH YOUR NAUSEA MEDICATION  *UNUSUAL SHORTNESS OF BREATH  *UNUSUAL BRUISING OR BLEEDING  TENDERNESS IN MOUTH AND THROAT WITH OR WITHOUT PRESENCE OF ULCERS  *URINARY PROBLEMS  *BOWEL PROBLEMS  UNUSUAL RASH Items with * indicate a potential emergency and should be followed up as soon as possible.  Feel free to call the clinic should you have any questions or concerns. The clinic phone number is [phone].  Please show the [facility] at check-in to the Emergency Department and triage nurse.

## 2018-01-26 ENCOUNTER — Ambulatory Visit
Admission: RE | Admit: 2018-01-26 | Discharge: 2018-01-26 | Disposition: A | Discharge status: Home / Self Care | Payer: BC Managed Care – PPO | Source: Ambulatory Visit | Attending: Radiation Oncology | Admitting: Radiation Oncology

## 2018-01-26 DIAGNOSIS — Z5111 Encounter for antineoplastic chemotherapy: Secondary | ICD-10-CM | POA: Diagnosis not present

## 2018-01-27 ENCOUNTER — Ambulatory Visit
Admission: RE | Admit: 2018-01-27 | Discharge: 2018-01-27 | Disposition: A | Discharge status: Home / Self Care | Payer: BC Managed Care – PPO | Source: Ambulatory Visit | Attending: Radiation Oncology | Admitting: Radiation Oncology

## 2018-01-27 DIAGNOSIS — Z5111 Encounter for antineoplastic chemotherapy: Secondary | ICD-10-CM | POA: Diagnosis not present

## 2018-01-30 ENCOUNTER — Inpatient Hospital Stay: Payer: BC Managed Care – PPO

## 2018-01-30 ENCOUNTER — Ambulatory Visit
Admission: RE | Admit: 2018-01-30 | Discharge: 2018-01-30 | Disposition: A | Discharge status: Home / Self Care | Payer: BC Managed Care – PPO | Source: Ambulatory Visit | Attending: Radiation Oncology | Admitting: Radiation Oncology

## 2018-01-30 DIAGNOSIS — C7951 Secondary malignant neoplasm of bone: Secondary | ICD-10-CM

## 2018-01-30 DIAGNOSIS — Z5111 Encounter for antineoplastic chemotherapy: Secondary | ICD-10-CM | POA: Diagnosis not present

## 2018-01-30 DIAGNOSIS — C539 Malignant neoplasm of cervix uteri, unspecified: Secondary | ICD-10-CM

## 2018-01-30 LAB — CBC WITH DIFFERENTIAL/PLATELET
Abs Immature Granulocytes: 0.06 10*3/uL (ref 0.00–0.07)
BASOS PCT: 0 %
Basophils Absolute: 0 10*3/uL (ref 0.0–0.1)
Eosinophils Absolute: 0 10*3/uL (ref 0.0–0.5)
Eosinophils Relative: 1 %
HCT: 29.8 % — ABNORMAL LOW (ref 36.0–46.0)
Hemoglobin: 10 g/dL — ABNORMAL LOW (ref 12.0–15.0)
Immature Granulocytes: 1 %
Lymphocytes Relative: 9 %
Lymphs Abs: 0.4 10*3/uL — ABNORMAL LOW (ref 0.7–4.0)
MCH: 29.6 pg (ref 26.0–34.0)
MCHC: 33.6 g/dL (ref 30.0–36.0)
MCV: 88.2 fL (ref 80.0–100.0)
Monocytes Absolute: 0.4 10*3/uL (ref 0.1–1.0)
Monocytes Relative: 9 %
NRBC: 0.7 % — AB (ref 0.0–0.2)
Neutro Abs: 3.6 10*3/uL (ref 1.7–7.7)
Neutrophils Relative %: 80 %
Platelets: 210 10*3/uL (ref 150–400)
RBC: 3.38 MIL/uL — ABNORMAL LOW (ref 3.87–5.11)
RDW: 14.9 % (ref 11.5–15.5)
WBC: 4.5 10*3/uL (ref 4.0–10.5)

## 2018-01-30 LAB — COMPREHENSIVE METABOLIC PANEL
ALT: 26 U/L (ref 0–44)
ANION GAP: 9 (ref 5–15)
AST: 14 U/L — ABNORMAL LOW (ref 15–41)
Albumin: 2.9 g/dL — ABNORMAL LOW (ref 3.5–5.0)
Alkaline Phosphatase: 68 U/L (ref 38–126)
BUN: 15 mg/dL (ref 8–23)
CO2: 25 mmol/L (ref 22–32)
Calcium: 8.5 mg/dL — ABNORMAL LOW (ref 8.9–10.3)
Chloride: 103 mmol/L (ref 98–111)
Creatinine, Ser: 0.76 mg/dL (ref 0.44–1.00)
GFR calc Af Amer: >60 mL/min (ref >60)
GFR calc non Af Amer: >60 mL/min (ref >60)
Glucose, Bld: 99 mg/dL (ref 70–99)
POTASSIUM: 3.6 mmol/L (ref 3.5–5.1)
Sodium: 137 mmol/L (ref 135–145)
Total Bilirubin: <0.2 mg/dL — ABNORMAL LOW (ref 0.3–1.2)
Total Protein: 5.8 g/dL — ABNORMAL LOW (ref 6.5–8.1)

## 2018-01-30 LAB — MAGNESIUM: Magnesium: 2 mg/dL (ref 1.7–2.4)

## 2018-01-30 MED ORDER — HEPARIN SOD (PORK) LOCK FLUSH 100 UNIT/ML IV SOLN
500.0000 [IU] | Freq: Once | INTRAVENOUS | Status: AC
  Administered 2018-01-30: 500 [IU]
  Filled 2018-01-30: qty 5

## 2018-01-30 MED ORDER — SODIUM CHLORIDE 0.9% FLUSH
10.0000 mL | Freq: Once | INTRAVENOUS | Status: AC
  Administered 2018-01-30: 10 mL
  Filled 2018-01-30: qty 10

## 2018-01-30 NOTE — Patient Instructions (Signed)

## 2018-01-31 ENCOUNTER — Encounter: Payer: Self-pay | Admitting: Hematology and Oncology

## 2018-01-31 ENCOUNTER — Telehealth: Payer: Self-pay | Admitting: Hematology and Oncology

## 2018-01-31 ENCOUNTER — Telehealth: Payer: Self-pay | Admitting: Oncology

## 2018-01-31 ENCOUNTER — Inpatient Hospital Stay (HOSPITAL_BASED_OUTPATIENT_CLINIC_OR_DEPARTMENT_OTHER): Payer: BC Managed Care – PPO | Admitting: Hematology and Oncology

## 2018-01-31 ENCOUNTER — Ambulatory Visit
Admission: RE | Admit: 2018-01-31 | Discharge: 2018-01-31 | Disposition: A | Discharge status: Home / Self Care | Payer: BC Managed Care – PPO | Source: Ambulatory Visit | Attending: Radiation Oncology | Admitting: Radiation Oncology

## 2018-01-31 DIAGNOSIS — T451X5A Adverse effect of antineoplastic and immunosuppressive drugs, initial encounter: Secondary | ICD-10-CM

## 2018-01-31 DIAGNOSIS — I10 Essential (primary) hypertension: Secondary | ICD-10-CM | POA: Diagnosis not present

## 2018-01-31 DIAGNOSIS — C539 Malignant neoplasm of cervix uteri, unspecified: Secondary | ICD-10-CM

## 2018-01-31 DIAGNOSIS — Z5111 Encounter for antineoplastic chemotherapy: Secondary | ICD-10-CM | POA: Diagnosis not present

## 2018-01-31 DIAGNOSIS — C530 Malignant neoplasm of endocervix: Secondary | ICD-10-CM

## 2018-01-31 DIAGNOSIS — D6481 Anemia due to antineoplastic chemotherapy: Secondary | ICD-10-CM | POA: Diagnosis not present

## 2018-01-31 DIAGNOSIS — C7951 Secondary malignant neoplasm of bone: Secondary | ICD-10-CM | POA: Diagnosis not present

## 2018-01-31 MED ORDER — DEXAMETHASONE 4 MG PO TABS: ORAL_TABLET | ORAL | 1 refills | Status: DC

## 2018-01-31 NOTE — Assessment & Plan Note (Signed)
She is currently not symptomatic.  She will receive palliative radiation therapy to her bone

## 2018-01-31 NOTE — Telephone Encounter (Signed)
Per 12/24 no new orders

## 2018-01-31 NOTE — Telephone Encounter (Signed)
Called Robin with [facility] to check on MSI status for patient's colon biopsy.  She said it was not back yet and will email Dr. Tresa Moore about the status.

## 2018-01-31 NOTE — Progress Notes (Signed)
[place] OFFICE PROGRESS NOTE  Patient Care Team: Antony Contras, MD as PCP - General (Family Medicine) Minus Breeding, MD as PCP - Cardiology (Cardiology)  ASSESSMENT & PLAN:  Cervical cancer [facility]) She denies new symptoms She will get repeat imaging study within 4 to 6 weeks after radiation therapy is completed to monitor the status of her response to treatment So far, she tolerated weekly chemotherapy well without major side effects  Anemia due to antineoplastic chemotherapy This is likely due to recent treatment. The patient denies recent history of bleeding such as epistaxis, hematuria or hematochezia. She is asymptomatic from the anemia. I will observe for now.   Metastasis to bone [facility]) She is currently not symptomatic.  She will receive palliative radiation therapy to her bone  Hypertension Her blood pressure is only mildly elevated I recommend her to continue to hold enalapril due to risk of renal failure   No orders of the defined types were placed in this encounter.   INTERVAL HISTORY: Please see below for problem oriented charting. She returns for further follow-up She denies major side effects from chemotherapy No nausea She has some mild hemorrhoidal bleeding, self-limiting No peripheral neuropathy from treatment No recent infection, fever or chills.  She denies bone pain  SUMMARY OF ONCOLOGIC HISTORY: Oncology History   MSI - Stable on cervix biopsy PD-L1 2%  Bone biopsy is consistent with metastatic GYN primary     Cervical cancer ([place])   09/08/2017 Initial Diagnosis    Patient has a history of symptomatic endometrial fibroids in 2013 for which she underwent hysteroscopic resection with benign pathology.  She has had lifelong normal Pap smears including in 2019.  Of note HPV testing was not performed in this Pap in 2019.  She developed symptoms of very light vaginal spotting in August 2019     11/22/2017 Procedure    She was seen by her  gynecologist, Dr. Radene Knee, who performed a pelvic examination.  On physical examination and endocervical mass was appreciated and felt to be likely to be a polyp or fibroid was biopsied on 11/22/17.       11/22/2017 Pathology Results    This revealed poorly differentiated invasive carcinoma.  Immunostains revealed that it was CK7 positive, P 16+, p53 positive, PAX 8+ and CK 5 6 weakly positive.  CK 20, ER PR and p53 immunostains were negative.  The morphology and Immuno profile favored a gynecologic primary with endocervical and endometrial adenocarcinoma in the differential diagnosis.     11/22/2017 Imaging    An ultrasound scan on November 22, 2017 revealed several intramural fibroids.  With saline infusion there was a 1.8 cm density within the endometrial cavity which could be a fibroid or polyp.     12/08/2017 Imaging    PET:  1. Marked hypermetabolism involving the cervix and uterus. Difficult to be certain of the origin but favor cervix. No evidence of parametrial or parauterine tumor and no pelvic or retroperitoneal hypermetabolic lymphadenopathy. 2. L5 vertebral body lesion worrisome for metastasis. 3. Hypermetabolic area involving the hepatic flexure region of the colon is worrisome for colon cancer. Recommend colonoscopy. 4. Benign left adrenal gland adenoma. 5. Symmetric hypermetabolism in the tongue base and tonsillar regions is likely inflamed lymphoid tissue. No neck mass or adenopathy.    12/19/2017 Imaging    1. Diffuse marrow replacement in the L5 vertebral body, indeterminate though metastatic disease is a concern. Correlate with upcoming biopsy. No extraosseous tumor or pathologic fracture. 2. Marrow  signal abnormality on both sides of the T11-12 disc space favored to be degenerative. 3. Severe L4-5 facet arthrosis with grade 1 anterolisthesis, mild bilateral lateral recess stenosis, and mild-to-moderate bilateral neural foraminal stenosis. Findings may worsen with  standing. 4. Moderate bilateral neural foraminal stenosis at L5-S1. 5. Partially visualized uterine masses which may reflect a combination of known adenocarcinoma and fibroids. 4 cm right adnexal mass may reflect an exophytic subserosal fibroid or ovarian neoplasm.    12/20/2017 Echocardiogram    LV EF: 60% -   65%    12/23/2017 Procedure    Successful placement of a right IJ approach Power Port with ultrasound and fluoroscopic guidance. The catheter is ready for use.    12/23/2017 Pathology Results    Bone, biopsy, L5 - METASTATIC POORLY DIFFERENTIATED CARCINOMA. - SEE COMMENT. Microscopic Comment Dr. Vic Ripper has reviewed the case and concurs with this interpretation. The case was discussed wtih Joylene John on 12/22/2017. Per request, a block will be sent for PDL1 testing and the results reported separately    01/11/2018 Cancer Staging    Staging form: Cervix Uteri, AJCC 8th Edition - Clinical: FIGO Stage IVB (cT2, cN0, pM1) - Signed by Heath Lark, MD on 01/11/2018    01/17/2018 -  Chemotherapy    The patient had weekly cisplatin     Colon cancer ([place])   01/11/2018 Procedure    - Hemorrhoids found on perianal exam. - Malignant partially obstructing tumor at the hepatic flexure. Biopsied. Tattooed. - Two 2 to 5 mm polyps in the sigmoid colon, removed with a cold snare. Resected and retrieved. - Diverticulosis in the sigmoid colon, in the distal descending colon and in the ascending colon. - Non-bleeding internal hemorrhoids. - The examined portion of the ileum was normal.    01/11/2018 Pathology Results    1. Colon, biopsy, hepatic flexure - ADENOCARCINOMA, SEE COMMENT. 2. Colon, polyp(s), sigmoid, polyp (2) - HYPERPLASTIC POLYP (X2 FRAGMENTS). - NO DYSPLASIA OR MALIGNANCY. Microscopic Comment 1. There are additional fragments of tubular adenoma suggesting this is a primary colon adenocarcinoma.      REVIEW OF SYSTEMS:   Constitutional: Denies fevers, chills or abnormal  weight loss Eyes: Denies blurriness of vision Ears, nose, mouth, throat, and face: Denies mucositis or sore throat Respiratory: Denies cough, dyspnea or wheezes Cardiovascular: Denies palpitation, chest discomfort or lower extremity swelling Gastrointestinal:  Denies nausea, heartburn or change in bowel habits Skin: Denies abnormal skin rashes Lymphatics: Denies new lymphadenopathy or easy bruising Neurological:Denies numbness, tingling or new weaknesses Behavioral/Psych: Mood is stable, no new changes  All other systems were reviewed with the patient and are negative.  I have reviewed the past medical history, past surgical history, social history and family history with the patient and they are unchanged from previous note.  ALLERGIES:  is allergic to clarithromycin; dilacor [diltiazem hcl]; and norvasc [amlodipine besylate].  MEDICATIONS:  Current Outpatient Medications  Medication Sig Dispense Refill  . amLODipine (NORVASC) 10 MG tablet Take 10 mg by mouth daily.     Marland Kitchen atenolol (TENORMIN) 50 MG tablet Take 50 mg by mouth daily.  1  . Cholecalciferol (VITAMIN D3) 5000 units CAPS Take 5,000 Units by mouth daily.     Marland Kitchen dexamethasone (DECADRON) 4 MG tablet Take 2 tablets by mouth once a day on the day after chemotherapy and then take 2 tablets two times a day for 2 days. Take with food. 30 tablet 1  . fish oil-omega-3 fatty acids 1000 MG capsule Take 1 g  by mouth daily.     Marland Kitchen lidocaine-prilocaine (EMLA) cream Apply to affected area once 30 g 3  . LORazepam (ATIVAN) 0.5 MG tablet Take 1 tablet (0.5 mg total) by mouth every 6 (six) hours as needed (Nausea or vomiting). (Patient not taking: Reported on 01/11/2018) 30 tablet 0  . Multiple Vitamin (MULTIVITAMIN WITH MINERALS) TABS tablet Take 1 tablet by mouth daily.    . ondansetron (ZOFRAN) 8 MG tablet Take 1 tablet (8 mg total) by mouth 2 (two) times daily as needed. Start on the third day after chemotherapy. (Patient not taking: Reported on  01/11/2018) 30 tablet 1  . prochlorperazine (COMPAZINE) 10 MG tablet Take 1 tablet (10 mg total) by mouth every 6 (six) hours as needed (Nausea or vomiting). (Patient not taking: Reported on 01/11/2018) 30 tablet 1  . spironolactone (ALDACTONE) 25 MG tablet Take 1 tablet (25 mg total) by mouth daily. 90 tablet 3   No current facility-administered medications for this visit.     PHYSICAL EXAMINATION: ECOG PERFORMANCE STATUS: 1 - Symptomatic but completely ambulatory  Vitals:   01/31/18 1043  BP: (!) 145/68  Pulse: 68  Resp: 18  Temp: 98.4 F (36.9 C)  SpO2: 100%   Filed Weights   01/31/18 1043  Weight: 178 lb 6.4 oz (80.9 kg)    GENERAL:alert, no distress and comfortable SKIN: skin color, texture, turgor are normal, no rashes or significant lesions EYES: normal, Conjunctiva are pink and non-injected, sclera clear OROPHARYNX:no exudate, no erythema and lips, buccal mucosa, and tongue normal  NECK: supple, thyroid normal size, non-tender, without nodularity LYMPH:  no palpable lymphadenopathy in the cervical, axillary or inguinal LUNGS: clear to auscultation and percussion with normal breathing effort HEART: regular rate & rhythm and no murmurs and no lower extremity edema ABDOMEN:abdomen soft, non-tender and normal bowel sounds Musculoskeletal:no cyanosis of digits and no clubbing  NEURO: alert & oriented x 3 with fluent speech, no focal motor/sensory deficits  LABORATORY DATA:  I have reviewed the data as listed    Component Value Date/Time   NA 137 01/30/2018 1343   K 3.6 01/30/2018 1343   CL 103 01/30/2018 1343   CO2 25 01/30/2018 1343   GLUCOSE 99 01/30/2018 1343   BUN 15 01/30/2018 1343   CREATININE 0.76 01/30/2018 1343   CREATININE 0.69 01/10/2018 1023   CALCIUM 8.5 (L) 01/30/2018 1343   PROT 5.8 (L) 01/30/2018 1343   ALBUMIN 2.9 (L) 01/30/2018 1343   AST 14 (L) 01/30/2018 1343   AST 12 (L) 12/28/2017 1333   ALT 26 01/30/2018 1343   ALT 13 12/28/2017 1333    ALKPHOS 68 01/30/2018 1343   BILITOT <0.2 (L) 01/30/2018 1343   BILITOT 0.4 12/28/2017 1333   GFRNONAA >60 01/30/2018 1343   GFRNONAA >60 01/10/2018 1023   GFRAA >60 01/30/2018 1343   GFRAA >60 01/10/2018 1023    No results found for: SPEP, UPEP  Lab Results  Component Value Date   WBC 4.5 01/30/2018   NEUTROABS 3.6 01/30/2018   HGB 10.0 (L) 01/30/2018   HCT 29.8 (L) 01/30/2018   MCV 88.2 01/30/2018   PLT 210 01/30/2018      Chemistry      Component Value Date/Time   NA 137 01/30/2018 1343   K 3.6 01/30/2018 1343   CL 103 01/30/2018 1343   CO2 25 01/30/2018 1343   BUN 15 01/30/2018 1343   CREATININE 0.76 01/30/2018 1343   CREATININE 0.69 01/10/2018 1023  Component Value Date/Time   CALCIUM 8.5 (L) 01/30/2018 1343   ALKPHOS 68 01/30/2018 1343   AST 14 (L) 01/30/2018 1343   AST 12 (L) 12/28/2017 1333   ALT 26 01/30/2018 1343   ALT 13 12/28/2017 1333   BILITOT <0.2 (L) 01/30/2018 1343   BILITOT 0.4 12/28/2017 1333       All questions were answered. The patient knows to call the clinic with any problems, questions or concerns. No barriers to learning was detected.  I spent 15 minutes counseling the patient face to face. The total time spent in the appointment was 20 minutes and more than 50% was on counseling and review of test results  Heath Lark, MD 01/31/2018 10:54 AM

## 2018-01-31 NOTE — Assessment & Plan Note (Signed)
Her blood pressure is only mildly elevated I recommend her to continue to hold enalapril due to risk of renal failure

## 2018-01-31 NOTE — Assessment & Plan Note (Signed)
She denies new symptoms She will get repeat imaging study within 4 to 6 weeks after radiation therapy is completed to monitor the status of her response to treatment So far, she tolerated weekly chemotherapy well without major side effects

## 2018-01-31 NOTE — Assessment & Plan Note (Signed)
This is likely due to recent treatment. The patient denies recent history of bleeding such as epistaxis, hematuria or hematochezia. She is asymptomatic from the anemia. I will observe for now.   

## 2018-02-02 ENCOUNTER — Ambulatory Visit
Admission: RE | Admit: 2018-02-02 | Discharge: 2018-02-02 | Disposition: A | Discharge status: Home / Self Care | Payer: BC Managed Care – PPO | Source: Ambulatory Visit | Attending: Radiation Oncology | Admitting: Radiation Oncology

## 2018-02-02 ENCOUNTER — Inpatient Hospital Stay: Payer: BC Managed Care – PPO

## 2018-02-02 VITALS — BP 139/76 | HR 62 | Temp 98.8°F | Resp 16

## 2018-02-02 DIAGNOSIS — Z5111 Encounter for antineoplastic chemotherapy: Secondary | ICD-10-CM | POA: Diagnosis not present

## 2018-02-02 DIAGNOSIS — C530 Malignant neoplasm of endocervix: Secondary | ICD-10-CM

## 2018-02-02 MED ORDER — HEPARIN SOD (PORK) LOCK FLUSH 100 UNIT/ML IV SOLN
500.0000 [IU] | Freq: Once | INTRAVENOUS | Status: AC | PRN
  Administered 2018-02-02: 500 [IU]
  Filled 2018-02-02: qty 5

## 2018-02-02 MED ORDER — POTASSIUM CHLORIDE 2 MEQ/ML IV SOLN
Freq: Once | INTRAVENOUS | Status: AC
  Administered 2018-02-02: 09:00:00 via INTRAVENOUS
  Filled 2018-02-02: qty 10

## 2018-02-02 MED ORDER — SODIUM CHLORIDE 0.9 % IV SOLN
40.0000 mg/m2 | Freq: Once | INTRAVENOUS | Status: AC
  Administered 2018-02-02: 76 mg via INTRAVENOUS
  Filled 2018-02-02: qty 76

## 2018-02-02 MED ORDER — SODIUM CHLORIDE 0.9% FLUSH
10.0000 mL | INTRAVENOUS | Status: DC | PRN
  Administered 2018-02-02: 10 mL
  Filled 2018-02-02: qty 10

## 2018-02-02 MED ORDER — PALONOSETRON HCL INJECTION 0.25 MG/5ML
INTRAVENOUS | Status: AC
  Filled 2018-02-02: qty 5

## 2018-02-02 MED ORDER — PALONOSETRON HCL INJECTION 0.25 MG/5ML
0.2500 mg | Freq: Once | INTRAVENOUS | Status: AC
  Administered 2018-02-02: 0.25 mg via INTRAVENOUS

## 2018-02-02 MED ORDER — SODIUM CHLORIDE 0.9 % IV SOLN
Freq: Once | INTRAVENOUS | Status: AC
  Administered 2018-02-02: 12:00:00 via INTRAVENOUS
  Filled 2018-02-02: qty 5

## 2018-02-02 MED ORDER — SODIUM CHLORIDE 0.9 % IV SOLN
Freq: Once | INTRAVENOUS | Status: AC
  Administered 2018-02-02: 09:00:00 via INTRAVENOUS
  Filled 2018-02-02: qty 250

## 2018-02-02 NOTE — Patient Instructions (Signed)
[facility] Discharge Instructions for Patients Receiving Chemotherapy  Today you received the following chemotherapy agents:  Cisplatin  To help prevent nausea and vomiting after your treatment, we encourage you to take your nausea medication as instructed.   If you develop nausea and vomiting that is not controlled by your nausea medication, call the clinic.   BELOW ARE SYMPTOMS THAT SHOULD BE REPORTED IMMEDIATELY:  *FEVER GREATER THAN 100.5 F  *CHILLS WITH OR WITHOUT FEVER  NAUSEA AND VOMITING THAT IS NOT CONTROLLED WITH YOUR NAUSEA MEDICATION  *UNUSUAL SHORTNESS OF BREATH  *UNUSUAL BRUISING OR BLEEDING  TENDERNESS IN MOUTH AND THROAT WITH OR WITHOUT PRESENCE OF ULCERS  *URINARY PROBLEMS  *BOWEL PROBLEMS  UNUSUAL RASH Items with * indicate a potential emergency and should be followed up as soon as possible.  Feel free to call the clinic should you have any questions or concerns. The clinic phone number is [phone].  Please show the [facility] at check-in to the Emergency Department and triage nurse.

## 2018-02-03 ENCOUNTER — Ambulatory Visit
Admission: RE | Admit: 2018-02-03 | Discharge: 2018-02-03 | Disposition: A | Discharge status: Home / Self Care | Payer: BC Managed Care – PPO | Source: Ambulatory Visit | Attending: Radiation Oncology | Admitting: Radiation Oncology

## 2018-02-03 DIAGNOSIS — Z5111 Encounter for antineoplastic chemotherapy: Secondary | ICD-10-CM | POA: Diagnosis not present

## 2018-02-06 ENCOUNTER — Ambulatory Visit
Admission: RE | Admit: 2018-02-06 | Discharge: 2018-02-06 | Disposition: A | Discharge status: Home / Self Care | Payer: BC Managed Care – PPO | Source: Ambulatory Visit | Attending: Radiation Oncology | Admitting: Radiation Oncology

## 2018-02-06 ENCOUNTER — Inpatient Hospital Stay: Payer: BC Managed Care – PPO

## 2018-02-06 DIAGNOSIS — C7951 Secondary malignant neoplasm of bone: Secondary | ICD-10-CM

## 2018-02-06 DIAGNOSIS — Z5111 Encounter for antineoplastic chemotherapy: Secondary | ICD-10-CM | POA: Diagnosis not present

## 2018-02-06 DIAGNOSIS — C539 Malignant neoplasm of cervix uteri, unspecified: Secondary | ICD-10-CM

## 2018-02-06 LAB — COMPREHENSIVE METABOLIC PANEL
ALT: 26 U/L (ref 0–44)
AST: 12 U/L — ABNORMAL LOW (ref 15–41)
Albumin: 3.1 g/dL — ABNORMAL LOW (ref 3.5–5.0)
Alkaline Phosphatase: 63 U/L (ref 38–126)
Anion gap: 8 (ref 5–15)
BUN: 20 mg/dL (ref 8–23)
CHLORIDE: 101 mmol/L (ref 98–111)
CO2: 26 mmol/L (ref 22–32)
Calcium: 9.6 mg/dL (ref 8.9–10.3)
Creatinine, Ser: 0.73 mg/dL (ref 0.44–1.00)
GFR calc Af Amer: >60 mL/min (ref >60)
GFR calc non Af Amer: >60 mL/min (ref >60)
Glucose, Bld: 91 mg/dL (ref 70–99)
Potassium: 4.1 mmol/L (ref 3.5–5.1)
Sodium: 135 mmol/L (ref 135–145)
Total Bilirubin: 0.3 mg/dL (ref 0.3–1.2)
Total Protein: 6.2 g/dL — ABNORMAL LOW (ref 6.5–8.1)

## 2018-02-06 LAB — CBC WITH DIFFERENTIAL/PLATELET
Abs Immature Granulocytes: 0.11 10*3/uL — ABNORMAL HIGH (ref 0.00–0.07)
Basophils Absolute: 0 10*3/uL (ref 0.0–0.1)
Basophils Relative: 0 %
Eosinophils Absolute: 0 10*3/uL (ref 0.0–0.5)
Eosinophils Relative: 0 %
HCT: 29.9 % — ABNORMAL LOW (ref 36.0–46.0)
Hemoglobin: 10.2 g/dL — ABNORMAL LOW (ref 12.0–15.0)
IMMATURE GRANULOCYTES: 1 %
Lymphocytes Relative: 6 %
Lymphs Abs: 0.5 10*3/uL — ABNORMAL LOW (ref 0.7–4.0)
MCH: 29.5 pg (ref 26.0–34.0)
MCHC: 34.1 g/dL (ref 30.0–36.0)
MCV: 86.4 fL (ref 80.0–100.0)
Monocytes Absolute: 0.9 10*3/uL (ref 0.1–1.0)
Monocytes Relative: 11 %
Neutro Abs: 6.3 10*3/uL (ref 1.7–7.7)
Neutrophils Relative %: 82 %
PLATELETS: 201 10*3/uL (ref 150–400)
RBC: 3.46 MIL/uL — ABNORMAL LOW (ref 3.87–5.11)
RDW: 15.4 % (ref 11.5–15.5)
WBC: 7.8 10*3/uL (ref 4.0–10.5)
nRBC: 0.8 % — ABNORMAL HIGH (ref 0.0–0.2)

## 2018-02-06 LAB — MAGNESIUM: Magnesium: 2 mg/dL (ref 1.7–2.4)

## 2018-02-06 MED ORDER — HEPARIN SOD (PORK) LOCK FLUSH 100 UNIT/ML IV SOLN
250.0000 [IU] | Freq: Once | INTRAVENOUS | Status: AC
  Administered 2018-02-06: 250 [IU]
  Filled 2018-02-06: qty 5

## 2018-02-06 MED ORDER — SODIUM CHLORIDE 0.9% FLUSH
10.0000 mL | Freq: Once | INTRAVENOUS | Status: AC
  Administered 2018-02-06: 10 mL
  Filled 2018-02-06: qty 10

## 2018-02-07 ENCOUNTER — Inpatient Hospital Stay (HOSPITAL_BASED_OUTPATIENT_CLINIC_OR_DEPARTMENT_OTHER): Payer: BC Managed Care – PPO | Admitting: Hematology and Oncology

## 2018-02-07 ENCOUNTER — Ambulatory Visit
Admission: RE | Admit: 2018-02-07 | Discharge: 2018-02-07 | Disposition: A | Discharge status: Home / Self Care | Payer: BC Managed Care – PPO | Source: Ambulatory Visit | Attending: Radiation Oncology | Admitting: Radiation Oncology

## 2018-02-07 ENCOUNTER — Encounter: Payer: Self-pay | Admitting: Hematology and Oncology

## 2018-02-07 ENCOUNTER — Telehealth: Payer: Self-pay | Admitting: Hematology and Oncology

## 2018-02-07 VITALS — BP 146/73 | HR 62 | Temp 98.6°F | Resp 18 | Ht 61.5 in | Wt 179.6 lb

## 2018-02-07 DIAGNOSIS — T451X5A Adverse effect of antineoplastic and immunosuppressive drugs, initial encounter: Secondary | ICD-10-CM

## 2018-02-07 DIAGNOSIS — C7951 Secondary malignant neoplasm of bone: Secondary | ICD-10-CM

## 2018-02-07 DIAGNOSIS — C530 Malignant neoplasm of endocervix: Secondary | ICD-10-CM

## 2018-02-07 DIAGNOSIS — C539 Malignant neoplasm of cervix uteri, unspecified: Secondary | ICD-10-CM | POA: Diagnosis not present

## 2018-02-07 DIAGNOSIS — C183 Malignant neoplasm of hepatic flexure: Secondary | ICD-10-CM

## 2018-02-07 DIAGNOSIS — C189 Malignant neoplasm of colon, unspecified: Secondary | ICD-10-CM | POA: Diagnosis not present

## 2018-02-07 DIAGNOSIS — K649 Unspecified hemorrhoids: Secondary | ICD-10-CM | POA: Insufficient documentation

## 2018-02-07 DIAGNOSIS — Z5111 Encounter for antineoplastic chemotherapy: Secondary | ICD-10-CM | POA: Diagnosis not present

## 2018-02-07 DIAGNOSIS — D6481 Anemia due to antineoplastic chemotherapy: Secondary | ICD-10-CM

## 2018-02-07 NOTE — Assessment & Plan Note (Signed)
We discussed conservative management for now.

## 2018-02-07 NOTE — Assessment & Plan Note (Signed)
This is likely due to recent treatment. The patient denies recent history of bleeding such as epistaxis, hematuria or hematochezia. She is asymptomatic from the anemia. I will observe for now.   

## 2018-02-07 NOTE — Assessment & Plan Note (Signed)
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  She agree with the plan of care

## 2018-02-07 NOTE — Progress Notes (Signed)
Toni Peterson OFFICE PROGRESS NOTE  Patient Care Team: Antony Contras, MD as PCP - General (Family Medicine) Minus Breeding, MD as PCP - Cardiology (Cardiology)  ASSESSMENT & PLAN:  Cervical cancer [facility]) She complains of hemorrhoidal bleeding/pain due to frequent bowel movement. We discussed conservative management.  We will proceed with chemotherapy as scheduled She will get repeat imaging study within 4 to 6 weeks after radiation therapy is completed to monitor the status of her response to treatment So far, she tolerated weekly chemotherapy well without major side effects  Colon cancer ([place]) Recent additional study showed abnormalities with mismatch protein repair I recommend genetic counselor referral due to concurrent cancer diagnosis and possibility of genetic disorder.  She agree with the plan of care  Anemia due to antineoplastic chemotherapy This is likely due to recent treatment. The patient denies recent history of bleeding such as epistaxis, hematuria or hematochezia. She is asymptomatic from the anemia. I will observe for now.   Hemorrhoids We discussed conservative management for now.   Orders Placed This Encounter  Procedures  . Ambulatory referral to Genetics    Referral Priority:   Routine    Referral Type:   Consultation    Referral Reason:   Specialty Services Required    Number of Visits Requested:   1    INTERVAL HISTORY: Please see below for problem oriented charting. She returns to be seen prior to cycle 4 of chemotherapy She complained of hemorrhoidal irritation. Denies nausea.  She has occasional constipation.  No recent infection, fever or chills No peripheral neuropathy.  SUMMARY OF ONCOLOGIC HISTORY: Oncology History   MSI - Stable on cervix biopsy PD-L1 2%  Bone biopsy is consistent with metastatic GYN primary MMR protein MLH1 and PMS2 abnormalities noted on colon biopsy     Cervical cancer ([place])   09/08/2017 Initial Diagnosis     Patient has a history of symptomatic endometrial fibroids in 2013 for which she underwent hysteroscopic resection with benign pathology.  She has had lifelong normal Pap smears including in 2019.  Of note HPV testing was not performed in this Pap in 2019.  She developed symptoms of very light vaginal spotting in August 2019     11/22/2017 Procedure    She was seen by her gynecologist, Dr. Radene Knee, who performed a pelvic examination.  On physical examination and endocervical mass was appreciated and felt to be likely to be a polyp or fibroid was biopsied on 11/22/17.       11/22/2017 Pathology Results    This revealed poorly differentiated invasive carcinoma.  Immunostains revealed that it was CK7 positive, P 16+, p53 positive, PAX 8+ and CK 5 6 weakly positive.  CK 20, ER PR and p53 immunostains were negative.  The morphology and Immuno profile favored a gynecologic primary with endocervical and endometrial adenocarcinoma in the differential diagnosis.     11/22/2017 Imaging    An ultrasound scan on November 22, 2017 revealed several intramural fibroids.  With saline infusion there was a 1.8 cm density within the endometrial cavity which could be a fibroid or polyp.     12/08/2017 Imaging    PET:  1. Marked hypermetabolism involving the cervix and uterus. Difficult to be certain of the origin but favor cervix. No evidence of parametrial or parauterine tumor and no pelvic or retroperitoneal hypermetabolic lymphadenopathy. 2. L5 vertebral body lesion worrisome for metastasis. 3. Hypermetabolic area involving the hepatic flexure region of the colon is worrisome for colon cancer. Recommend  colonoscopy. 4. Benign left adrenal gland adenoma. 5. Symmetric hypermetabolism in the tongue base and tonsillar regions is likely inflamed lymphoid tissue. No neck mass or adenopathy.    12/19/2017 Imaging    1. Diffuse marrow replacement in the L5 vertebral body, indeterminate though metastatic disease is  a concern. Correlate with upcoming biopsy. No extraosseous tumor or pathologic fracture. 2. Marrow signal abnormality on both sides of the T11-12 disc space favored to be degenerative. 3. Severe L4-5 facet arthrosis with grade 1 anterolisthesis, mild bilateral lateral recess stenosis, and mild-to-moderate bilateral neural foraminal stenosis. Findings may worsen with standing. 4. Moderate bilateral neural foraminal stenosis at L5-S1. 5. Partially visualized uterine masses which may reflect a combination of known adenocarcinoma and fibroids. 4 cm right adnexal mass may reflect an exophytic subserosal fibroid or ovarian neoplasm.    12/20/2017 Echocardiogram    LV EF: 60% -   65%    12/23/2017 Procedure    Successful placement of a right IJ approach Power Port with ultrasound and fluoroscopic guidance. The catheter is ready for use.    12/23/2017 Pathology Results    Bone, biopsy, L5 - METASTATIC POORLY DIFFERENTIATED CARCINOMA. - SEE COMMENT. Microscopic Comment Dr. Vic Ripper has reviewed the case and concurs with this interpretation. The case was discussed wtih Joylene John on 12/22/2017. Per request, a block will be sent for PDL1 testing and the results reported separately    01/11/2018 Cancer Staging    Staging form: Cervix Uteri, AJCC 8th Edition - Clinical: FIGO Stage IVB (cT2, cN0, pM1) - Signed by Heath Lark, MD on 01/11/2018    01/17/2018 -  Chemotherapy    The patient had weekly cisplatin     Colon cancer ([place])   01/11/2018 Procedure    - Hemorrhoids found on perianal exam. - Malignant partially obstructing tumor at the hepatic flexure. Biopsied. Tattooed. - Two 2 to 5 mm polyps in the sigmoid colon, removed with a cold snare. Resected and retrieved. - Diverticulosis in the sigmoid colon, in the distal descending colon and in the ascending colon. - Non-bleeding internal hemorrhoids. - The examined portion of the ileum was normal.    01/11/2018 Pathology Results    1. Colon,  biopsy, hepatic flexure - ADENOCARCINOMA, SEE COMMENT. 2. Colon, polyp(s), sigmoid, polyp (2) - HYPERPLASTIC POLYP (X2 FRAGMENTS). - NO DYSPLASIA OR MALIGNANCY. Microscopic Comment 1. There are additional fragments of tubular adenoma suggesting this is a primary colon adenocarcinoma.     02/07/2018 Cancer Staging    Staging form: Colon and Rectum, AJCC 8th Edition - Clinical: Stage I (cT1, cN0, cM0) - Signed by Heath Lark, MD on 02/07/2018     REVIEW OF SYSTEMS:   Constitutional: Denies fevers, chills or abnormal weight loss Eyes: Denies blurriness of vision Ears, nose, mouth, throat, and face: Denies mucositis or sore throat Respiratory: Denies cough, dyspnea or wheezes Cardiovascular: Denies palpitation, chest discomfort or lower extremity swelling Gastrointestinal:  Denies nausea, heartburn or change in bowel habits Skin: Denies abnormal skin rashes Lymphatics: Denies new lymphadenopathy or easy bruising Neurological:Denies numbness, tingling or new weaknesses Behavioral/Psych: Mood is stable, no new changes  All other systems were reviewed with the patient and are negative.  I have reviewed the past medical history, past surgical history, social history and family history with the patient and they are unchanged from previous note.  ALLERGIES:  is allergic to clarithromycin; dilacor [diltiazem hcl]; and norvasc [amlodipine besylate].  MEDICATIONS:  Current Outpatient Medications  Medication Sig Dispense Refill  . amLODipine (  NORVASC) 10 MG tablet Take 10 mg by mouth daily.     Marland Kitchen atenolol (TENORMIN) 50 MG tablet Take 50 mg by mouth daily.  1  . Cholecalciferol (VITAMIN D3) 5000 units CAPS Take 5,000 Units by mouth daily.     Marland Kitchen dexamethasone (DECADRON) 4 MG tablet Take 2 tablets by mouth once a day on the day after chemotherapy and then take 2 tablets two times a day for 2 days. Take with food. 30 tablet 1  . fish oil-omega-3 fatty acids 1000 MG capsule Take 1 g by mouth  daily.     Marland Kitchen lidocaine-prilocaine (EMLA) cream Apply to affected area once 30 g 3  . LORazepam (ATIVAN) 0.5 MG tablet Take 1 tablet (0.5 mg total) by mouth every 6 (six) hours as needed (Nausea or vomiting). (Patient not taking: Reported on 01/11/2018) 30 tablet 0  . Multiple Vitamin (MULTIVITAMIN WITH MINERALS) TABS tablet Take 1 tablet by mouth daily.    . ondansetron (ZOFRAN) 8 MG tablet Take 1 tablet (8 mg total) by mouth 2 (two) times daily as needed. Start on the third day after chemotherapy. (Patient not taking: Reported on 01/11/2018) 30 tablet 1  . prochlorperazine (COMPAZINE) 10 MG tablet Take 1 tablet (10 mg total) by mouth every 6 (six) hours as needed (Nausea or vomiting). (Patient not taking: Reported on 01/11/2018) 30 tablet 1  . spironolactone (ALDACTONE) 25 MG tablet Take 1 tablet (25 mg total) by mouth daily. 90 tablet 3   No current facility-administered medications for this visit.     PHYSICAL EXAMINATION: ECOG PERFORMANCE STATUS: 1 - Symptomatic but completely ambulatory  Vitals:   02/07/18 0918  BP: (!) 146/73  Pulse: 62  Resp: 18  Temp: 98.6 F (37 C)  SpO2: 100%   Filed Weights   02/07/18 0918  Weight: 179 lb 9.6 oz (81.5 kg)    GENERAL:alert, no distress and comfortable SKIN: skin color, texture, turgor are normal, no rashes or significant lesions EYES: normal, Conjunctiva are pink and non-injected, sclera clear OROPHARYNX:no exudate, no erythema and lips, buccal mucosa, and tongue normal  NECK: supple, thyroid normal size, non-tender, without nodularity LYMPH:  no palpable lymphadenopathy in the cervical, axillary or inguinal LUNGS: clear to auscultation and percussion with normal breathing effort HEART: regular rate & rhythm and no murmurs and no lower extremity edema ABDOMEN:abdomen soft, non-tender and normal bowel sounds Musculoskeletal:no cyanosis of digits and no clubbing  NEURO: alert & oriented x 3 with fluent speech, no focal motor/sensory  deficits  LABORATORY DATA:  I have reviewed the data as listed    Component Value Date/Time   NA 135 02/06/2018 0944   K 4.1 02/06/2018 0944   CL 101 02/06/2018 0944   CO2 26 02/06/2018 0944   GLUCOSE 91 02/06/2018 0944   BUN 20 02/06/2018 0944   CREATININE 0.73 02/06/2018 0944   CREATININE 0.69 01/10/2018 1023   CALCIUM 9.6 02/06/2018 0944   PROT 6.2 (L) 02/06/2018 0944   ALBUMIN 3.1 (L) 02/06/2018 0944   AST 12 (L) 02/06/2018 0944   AST 12 (L) 12/28/2017 1333   ALT 26 02/06/2018 0944   ALT 13 12/28/2017 1333   ALKPHOS 63 02/06/2018 0944   BILITOT 0.3 02/06/2018 0944   BILITOT 0.4 12/28/2017 1333   GFRNONAA >60 02/06/2018 0944   GFRNONAA >60 01/10/2018 1023   GFRAA >60 02/06/2018 0944   GFRAA >60 01/10/2018 1023    No results found for: SPEP, UPEP  Lab Results  Component Value Date  WBC 7.8 02/06/2018   NEUTROABS 6.3 02/06/2018   HGB 10.2 (L) 02/06/2018   HCT 29.9 (L) 02/06/2018   MCV 86.4 02/06/2018   PLT 201 02/06/2018      Chemistry      Component Value Date/Time   NA 135 02/06/2018 0944   K 4.1 02/06/2018 0944   CL 101 02/06/2018 0944   CO2 26 02/06/2018 0944   BUN 20 02/06/2018 0944   CREATININE 0.73 02/06/2018 0944   CREATININE 0.69 01/10/2018 1023      Component Value Date/Time   CALCIUM 9.6 02/06/2018 0944   ALKPHOS 63 02/06/2018 0944   AST 12 (L) 02/06/2018 0944   AST 12 (L) 12/28/2017 1333   ALT 26 02/06/2018 0944   ALT 13 12/28/2017 1333   BILITOT 0.3 02/06/2018 0944   BILITOT 0.4 12/28/2017 1333     All questions were answered. The patient knows to call the clinic with any problems, questions or concerns. No barriers to learning was detected.  I spent 15 minutes counseling the patient face to face. The total time spent in the appointment was 20 minutes and more than 50% was on counseling and review of test results  Heath Lark, MD 02/07/2018 10:08 AM

## 2018-02-07 NOTE — Telephone Encounter (Signed)
Gave avs and calendar ° °

## 2018-02-07 NOTE — Assessment & Plan Note (Signed)
She complains of hemorrhoidal bleeding/pain due to frequent bowel movement. We discussed conservative management.  We will proceed with chemotherapy as scheduled She will get repeat imaging study within 4 to 6 weeks after radiation therapy is completed to monitor the status of her response to treatment So far, she tolerated weekly chemotherapy well without major side effects

## 2018-02-07 NOTE — Progress Notes (Signed)
Gynecologic Oncology Multi-Disciplinary Disposition Conference Note  Date of the Conference: January 23, 2018  Patient Name: Toni Peterson  Referring Provider: Dr. Radene Knee Primary GYN Oncologist: Dr. Everitt Amber  Stage/Disposition:  Stage IV cervical cancer and newly diagnosed colon cancer.  Disposition is to chemoradiation with cisplatin followed by brachytherapy.  PET scan soon after.  If complete response to primary therapy then Gen Surg may consider hemicolectomy. If incomplete response, continue with chemo.   This Multidisciplinary conference took place involving physicians from [place], [place], Radiation Oncology, Pathology, Radiology along with the Gynecologic Oncology Nurse Practitioner and RN.  Comprehensive assessment of the patient's malignancy, staging, need for surgery, chemotherapy, radiation therapy, and need for further testing were reviewed. Supportive measures, both inpatient and following discharge were also discussed. The recommended plan of care is documented. Greater than 35 minutes were spent correlating and coordinating this patient's care.

## 2018-02-09 ENCOUNTER — Inpatient Hospital Stay (HOSPITAL_BASED_OUTPATIENT_CLINIC_OR_DEPARTMENT_OTHER): Payer: BC Managed Care – PPO | Admitting: Medical

## 2018-02-09 ENCOUNTER — Ambulatory Visit
Admission: RE | Admit: 2018-02-09 | Discharge: 2018-02-09 | Disposition: A | Discharge status: Home / Self Care | Payer: BC Managed Care – PPO | Source: Ambulatory Visit | Attending: Radiation Oncology | Admitting: Radiation Oncology

## 2018-02-09 ENCOUNTER — Inpatient Hospital Stay: Payer: BC Managed Care – PPO | Attending: Hematology and Oncology

## 2018-02-09 ENCOUNTER — Telehealth: Payer: Self-pay | Admitting: Oncology

## 2018-02-09 ENCOUNTER — Ambulatory Visit: Payer: BC Managed Care – PPO | Admitting: Nutrition

## 2018-02-09 ENCOUNTER — Other Ambulatory Visit: Payer: Self-pay | Admitting: Medical

## 2018-02-09 VITALS — BP 149/95 | HR 91 | Temp 98.8°F | Resp 14

## 2018-02-09 DIAGNOSIS — C530 Malignant neoplasm of endocervix: Secondary | ICD-10-CM | POA: Insufficient documentation

## 2018-02-09 DIAGNOSIS — K649 Unspecified hemorrhoids: Secondary | ICD-10-CM

## 2018-02-09 MED ORDER — PALONOSETRON HCL INJECTION 0.25 MG/5ML
0.2500 mg | Freq: Once | INTRAVENOUS | Status: AC
  Administered 2018-02-09: 0.25 mg via INTRAVENOUS

## 2018-02-09 MED ORDER — PALONOSETRON HCL INJECTION 0.25 MG/5ML
INTRAVENOUS | Status: AC
  Filled 2018-02-09: qty 5

## 2018-02-09 MED ORDER — SODIUM CHLORIDE 0.9 % IV SOLN
40.0000 mg/m2 | Freq: Once | INTRAVENOUS | Status: AC
  Administered 2018-02-09: 76 mg via INTRAVENOUS
  Filled 2018-02-09: qty 76

## 2018-02-09 MED ORDER — PRAMOXINE HCL 1 % RE FOAM: 1.0000 "application " | Freq: Three times a day (TID) | RECTAL | 2 refills | Status: DC | PRN

## 2018-02-09 MED ORDER — SODIUM CHLORIDE 0.9 % IV SOLN
Freq: Once | INTRAVENOUS | Status: AC
  Administered 2018-02-09: 11:00:00 via INTRAVENOUS
  Filled 2018-02-09: qty 5

## 2018-02-09 MED ORDER — HEPARIN SOD (PORK) LOCK FLUSH 100 UNIT/ML IV SOLN
500.0000 [IU] | Freq: Once | INTRAVENOUS | Status: AC | PRN
  Administered 2018-02-09: 500 [IU]
  Filled 2018-02-09: qty 5

## 2018-02-09 MED ORDER — SODIUM CHLORIDE 0.9 % IV SOLN
Freq: Once | INTRAVENOUS | Status: AC
  Administered 2018-02-09: 09:00:00 via INTRAVENOUS
  Filled 2018-02-09: qty 250

## 2018-02-09 MED ORDER — POTASSIUM CHLORIDE 2 MEQ/ML IV SOLN
Freq: Once | INTRAVENOUS | Status: AC
  Administered 2018-02-09: 09:00:00 via INTRAVENOUS
  Filled 2018-02-09: qty 10

## 2018-02-09 MED ORDER — SODIUM CHLORIDE 0.9% FLUSH
10.0000 mL | INTRAVENOUS | Status: DC | PRN
  Administered 2018-02-09: 10 mL
  Filled 2018-02-09: qty 10

## 2018-02-09 NOTE — Patient Instructions (Signed)
[place] Discharge Instructions for Patients Receiving Chemotherapy  Today you received the following chemotherapy agents:  Cisplatin  To help prevent nausea and vomiting after your treatment, we encourage you to take your nausea medication as instructed.   If you develop nausea and vomiting that is not controlled by your nausea medication, call the clinic.   BELOW ARE SYMPTOMS THAT SHOULD BE REPORTED IMMEDIATELY:  *FEVER GREATER THAN 100.5 F  *CHILLS WITH OR WITHOUT FEVER  NAUSEA AND VOMITING THAT IS NOT CONTROLLED WITH YOUR NAUSEA MEDICATION  *UNUSUAL SHORTNESS OF BREATH  *UNUSUAL BRUISING OR BLEEDING  TENDERNESS IN MOUTH AND THROAT WITH OR WITHOUT PRESENCE OF ULCERS  *URINARY PROBLEMS  *BOWEL PROBLEMS  UNUSUAL RASH Items with * indicate a potential emergency and should be followed up as soon as possible.  Feel free to call the clinic should you have any questions or concerns. The clinic phone number is [phone].  Please show the [place] at check-in to the Emergency Department and triage nurse.

## 2018-02-09 NOTE — Progress Notes (Signed)
Nutrition follow-up completed with patient during infusion for stage IV cervical carcinoma.  Patient continues concurrent chemoradiation therapy. Weight is stable and documented as 179.6 pounds December 31. Patient reports her only concern is pain from hemorrhoids. She is going to call her radiation oncologist.  Nutrition diagnosis: Food and nutrition related knowledge deficit continues.  Intervention: Patient will continue high-calorie, high-protein foods for weight maintenance. Enforced strategies for appropriate bowel regimens. Teach back method used.  Monitoring, evaluation, goals: Patient will tolerate adequate calories and protein for weight maintenance.  Next visit: Wednesday, January 15 during infusion.  **Disclaimer: This note was dictated with voice recognition software. Similar sounding words can inadvertently be transcribed and this note may contain transcription errors which may not have been corrected upon publication of note.**

## 2018-02-09 NOTE — Telephone Encounter (Signed)
Toni Peterson called and said she is here for radiation and is worried about checking in for her infusion apt at 8:15.  Called Lauren, RN in infusion and notified her that Toni Peterson is here. She said she will need to check in at registration for a wrist band.  Called Shahara back and relayed the message from [facility].

## 2018-02-10 ENCOUNTER — Telehealth: Payer: Self-pay | Admitting: Medical

## 2018-02-10 ENCOUNTER — Telehealth: Payer: Self-pay | Admitting: Oncology

## 2018-02-10 ENCOUNTER — Ambulatory Visit
Admission: RE | Admit: 2018-02-10 | Discharge: 2018-02-10 | Disposition: A | Discharge status: Home / Self Care | Payer: BC Managed Care – PPO | Source: Ambulatory Visit | Attending: Radiation Oncology | Admitting: Radiation Oncology

## 2018-02-10 DIAGNOSIS — C530 Malignant neoplasm of endocervix: Secondary | ICD-10-CM | POA: Diagnosis not present

## 2018-02-10 NOTE — Telephone Encounter (Signed)
Toni Peterson called and said she was prescribed proctofoam by Toni Peterson Mealy, PA and she is concerned because she thought she was getting a steroid and the package she recieved says nonsteroidal.  She is also concerned because she had to pay $60 for it. Checked with Dr. Sondra Peterson who advised that the proctofoam works well for hemorrhoids and to go ahead an use it. Message was relayed to [facility].

## 2018-02-10 NOTE — Telephone Encounter (Signed)
No los per 01/02 los °

## 2018-02-13 ENCOUNTER — Inpatient Hospital Stay: Payer: BC Managed Care – PPO

## 2018-02-13 ENCOUNTER — Ambulatory Visit
Admission: RE | Admit: 2018-02-13 | Discharge: 2018-02-13 | Disposition: A | Discharge status: Home / Self Care | Payer: BC Managed Care – PPO | Source: Ambulatory Visit | Attending: Radiation Oncology | Admitting: Radiation Oncology

## 2018-02-13 DIAGNOSIS — C539 Malignant neoplasm of cervix uteri, unspecified: Secondary | ICD-10-CM

## 2018-02-13 DIAGNOSIS — C530 Malignant neoplasm of endocervix: Secondary | ICD-10-CM | POA: Diagnosis not present

## 2018-02-13 DIAGNOSIS — C7951 Secondary malignant neoplasm of bone: Secondary | ICD-10-CM

## 2018-02-13 LAB — COMPREHENSIVE METABOLIC PANEL
ALT: 25 U/L (ref 0–44)
AST: 11 U/L — AB (ref 15–41)
Albumin: 3 g/dL — ABNORMAL LOW (ref 3.5–5.0)
Alkaline Phosphatase: 63 U/L (ref 38–126)
Anion gap: 8 (ref 5–15)
BUN: 18 mg/dL (ref 8–23)
CO2: 24 mmol/L (ref 22–32)
Calcium: 9.4 mg/dL (ref 8.9–10.3)
Chloride: 103 mmol/L (ref 98–111)
Creatinine, Ser: 0.78 mg/dL (ref 0.44–1.00)
GFR calc Af Amer: >60 mL/min (ref >60)
Glucose, Bld: 114 mg/dL — ABNORMAL HIGH (ref 70–99)
Potassium: 4.3 mmol/L (ref 3.5–5.1)
Sodium: 135 mmol/L (ref 135–145)
Total Bilirubin: <0.2 mg/dL — ABNORMAL LOW (ref 0.3–1.2)
Total Protein: 6 g/dL — ABNORMAL LOW (ref 6.5–8.1)

## 2018-02-13 LAB — CBC WITH DIFFERENTIAL/PLATELET
Abs Immature Granulocytes: 0.18 10*3/uL — ABNORMAL HIGH (ref 0.00–0.07)
BASOS PCT: 0 %
Basophils Absolute: 0 10*3/uL (ref 0.0–0.1)
EOS ABS: 0 10*3/uL (ref 0.0–0.5)
EOS PCT: 0 %
HCT: 29.4 % — ABNORMAL LOW (ref 36.0–46.0)
Hemoglobin: 10.1 g/dL — ABNORMAL LOW (ref 12.0–15.0)
Immature Granulocytes: 2 %
Lymphocytes Relative: 3 %
Lymphs Abs: 0.3 10*3/uL — ABNORMAL LOW (ref 0.7–4.0)
MCH: 29.9 pg (ref 26.0–34.0)
MCHC: 34.4 g/dL (ref 30.0–36.0)
MCV: 87 fL (ref 80.0–100.0)
Monocytes Absolute: 0.9 10*3/uL (ref 0.1–1.0)
Monocytes Relative: 11 %
Neutro Abs: 6.9 10*3/uL (ref 1.7–7.7)
Neutrophils Relative %: 84 %
Platelets: 275 10*3/uL (ref 150–400)
RBC: 3.38 MIL/uL — ABNORMAL LOW (ref 3.87–5.11)
RDW: 16.5 % — ABNORMAL HIGH (ref 11.5–15.5)
WBC: 8.2 10*3/uL (ref 4.0–10.5)
nRBC: 0.7 % — ABNORMAL HIGH (ref 0.0–0.2)

## 2018-02-13 LAB — MAGNESIUM: Magnesium: 1.8 mg/dL (ref 1.7–2.4)

## 2018-02-13 MED ORDER — SODIUM CHLORIDE 0.9% FLUSH
10.0000 mL | Freq: Once | INTRAVENOUS | Status: AC
  Administered 2018-02-13: 10 mL
  Filled 2018-02-13: qty 10

## 2018-02-13 MED ORDER — HEPARIN SOD (PORK) LOCK FLUSH 100 UNIT/ML IV SOLN
250.0000 [IU] | Freq: Once | INTRAVENOUS | Status: DC
  Filled 2018-02-13: qty 5

## 2018-02-13 MED ORDER — HEPARIN SOD (PORK) LOCK FLUSH 100 UNIT/ML IV SOLN
500.0000 [IU] | Freq: Once | INTRAVENOUS | Status: AC
  Administered 2018-02-13: 500 [IU]
  Filled 2018-02-13: qty 5

## 2018-02-13 NOTE — Progress Notes (Signed)
The patient was seen in the infusion room today while she was receiving chemotherapy.  She reports that she has had worsening hemorrhoids.  She was given a prescription for Proctofoam 1% 3 times daily.  Sandi Mealy, MHS, PA-C Physician Assistant

## 2018-02-14 ENCOUNTER — Ambulatory Visit
Admission: RE | Admit: 2018-02-14 | Discharge: 2018-02-14 | Disposition: A | Discharge status: Home / Self Care | Payer: BC Managed Care – PPO | Source: Ambulatory Visit | Attending: Radiation Oncology | Admitting: Radiation Oncology

## 2018-02-14 ENCOUNTER — Telehealth: Payer: Self-pay | Admitting: Hematology and Oncology

## 2018-02-14 ENCOUNTER — Other Ambulatory Visit: Payer: Self-pay | Admitting: Radiation Oncology

## 2018-02-14 ENCOUNTER — Inpatient Hospital Stay (HOSPITAL_BASED_OUTPATIENT_CLINIC_OR_DEPARTMENT_OTHER): Payer: BC Managed Care – PPO | Admitting: Hematology and Oncology

## 2018-02-14 ENCOUNTER — Other Ambulatory Visit: Payer: Self-pay | Admitting: Hematology and Oncology

## 2018-02-14 ENCOUNTER — Encounter: Payer: Self-pay | Admitting: Oncology

## 2018-02-14 ENCOUNTER — Encounter: Payer: Self-pay | Admitting: Hematology and Oncology

## 2018-02-14 DIAGNOSIS — C539 Malignant neoplasm of cervix uteri, unspecified: Secondary | ICD-10-CM

## 2018-02-14 DIAGNOSIS — C530 Malignant neoplasm of endocervix: Secondary | ICD-10-CM | POA: Diagnosis not present

## 2018-02-14 DIAGNOSIS — C7951 Secondary malignant neoplasm of bone: Secondary | ICD-10-CM | POA: Diagnosis not present

## 2018-02-14 DIAGNOSIS — K649 Unspecified hemorrhoids: Secondary | ICD-10-CM

## 2018-02-14 DIAGNOSIS — D6481 Anemia due to antineoplastic chemotherapy: Secondary | ICD-10-CM

## 2018-02-14 DIAGNOSIS — T451X5A Adverse effect of antineoplastic and immunosuppressive drugs, initial encounter: Secondary | ICD-10-CM

## 2018-02-14 MED ORDER — HYDROCORTISONE 2.5 % RE CREA: 1.0000 "application " | TOPICAL_CREAM | Freq: Two times a day (BID) | RECTAL | 0 refills | Status: DC

## 2018-02-14 NOTE — Progress Notes (Signed)
[place] OFFICE PROGRESS NOTE  Patient Care Team: Antony Contras, MD as PCP - General (Family Medicine) Minus Breeding, MD as PCP - Cardiology (Cardiology)  ASSESSMENT & PLAN:  Cervical cancer [facility]) She complains of hemorrhoidal bleeding/pain due to frequent bowel movement. We discussed conservative management.  We will proceed with chemotherapy as scheduled Since she tolerated treatment well, if her blood counts are satisfactory, she might get 6 dose of chemotherapy next week She will get repeat imaging study within 4 to 6 weeks after radiation therapy is completed to monitor the status of her response to treatment So far, she tolerated weekly chemotherapy well without major side effects  Hemorrhoids We discussed conservative management for now. Overall, it is improving.  Anemia due to antineoplastic chemotherapy This is likely due to recent treatment. The patient denies recent history of bleeding such as epistaxis, hematuria or hematochezia. She is asymptomatic from the anemia. I will observe for now.   Metastasis to bone [facility]) She is currently not symptomatic.  She will receive palliative radiation therapy to her bone   No orders of the defined types were placed in this encounter.   INTERVAL HISTORY: Please see below for problem oriented charting. She is seen prior to cycle 5 of chemotherapy. Her hemorrhoidal bleeding and irritation has improved The patient denies any recent signs or symptoms of bleeding such as spontaneous epistaxis, hematuria or hematochezia. She denies nausea, peripheral neuropathy or side effects from cisplatin so far No bone pain.  SUMMARY OF ONCOLOGIC HISTORY: Oncology History   MSI - Stable on cervix biopsy PD-L1 2%  Bone biopsy is consistent with metastatic GYN primary MMR protein MLH1 and PMS2 abnormalities noted on colon biopsy     Cervical cancer ([place])   09/08/2017 Initial Diagnosis    Patient has a history of symptomatic  endometrial fibroids in 2013 for which she underwent hysteroscopic resection with benign pathology.  She has had lifelong normal Pap smears including in 2019.  Of note HPV testing was not performed in this Pap in 2019.  She developed symptoms of very light vaginal spotting in August 2019     11/22/2017 Procedure    She was seen by her gynecologist, Dr. Radene Knee, who performed a pelvic examination.  On physical examination and endocervical mass was appreciated and felt to be likely to be a polyp or fibroid was biopsied on 11/22/17.       11/22/2017 Pathology Results    This revealed poorly differentiated invasive carcinoma.  Immunostains revealed that it was CK7 positive, P 16+, p53 positive, PAX 8+ and CK 5 6 weakly positive.  CK 20, ER PR and p53 immunostains were negative.  The morphology and Immuno profile favored a gynecologic primary with endocervical and endometrial adenocarcinoma in the differential diagnosis.     11/22/2017 Imaging    An ultrasound scan on November 22, 2017 revealed several intramural fibroids.  With saline infusion there was a 1.8 cm density within the endometrial cavity which could be a fibroid or polyp.     12/08/2017 Imaging    PET:  1. Marked hypermetabolism involving the cervix and uterus. Difficult to be certain of the origin but favor cervix. No evidence of parametrial or parauterine tumor and no pelvic or retroperitoneal hypermetabolic lymphadenopathy. 2. L5 vertebral body lesion worrisome for metastasis. 3. Hypermetabolic area involving the hepatic flexure region of the colon is worrisome for colon cancer. Recommend colonoscopy. 4. Benign left adrenal gland adenoma. 5. Symmetric hypermetabolism in the tongue base and  tonsillar regions is likely inflamed lymphoid tissue. No neck mass or adenopathy.    12/19/2017 Imaging    1. Diffuse marrow replacement in the L5 vertebral body, indeterminate though metastatic disease is a concern. Correlate with upcoming  biopsy. No extraosseous tumor or pathologic fracture. 2. Marrow signal abnormality on both sides of the T11-12 disc space favored to be degenerative. 3. Severe L4-5 facet arthrosis with grade 1 anterolisthesis, mild bilateral lateral recess stenosis, and mild-to-moderate bilateral neural foraminal stenosis. Findings may worsen with standing. 4. Moderate bilateral neural foraminal stenosis at L5-S1. 5. Partially visualized uterine masses which may reflect a combination of known adenocarcinoma and fibroids. 4 cm right adnexal mass may reflect an exophytic subserosal fibroid or ovarian neoplasm.    12/20/2017 Echocardiogram    LV EF: 60% -   65%    12/23/2017 Procedure    Successful placement of a right IJ approach Power Port with ultrasound and fluoroscopic guidance. The catheter is ready for use.    12/23/2017 Pathology Results    Bone, biopsy, L5 - METASTATIC POORLY DIFFERENTIATED CARCINOMA. - SEE COMMENT. Microscopic Comment Dr. Vic Ripper has reviewed the case and concurs with this interpretation. The case was discussed wtih Joylene John on 12/22/2017. Per request, a block will be sent for PDL1 testing and the results reported separately    01/11/2018 Cancer Staging    Staging form: Cervix Uteri, AJCC 8th Edition - Clinical: FIGO Stage IVB (cT2, cN0, pM1) - Signed by Heath Lark, MD on 01/11/2018    01/17/2018 -  Chemotherapy    The patient had weekly cisplatin     Colon cancer ([place])   01/11/2018 Procedure    - Hemorrhoids found on perianal exam. - Malignant partially obstructing tumor at the hepatic flexure. Biopsied. Tattooed. - Two 2 to 5 mm polyps in the sigmoid colon, removed with a cold snare. Resected and retrieved. - Diverticulosis in the sigmoid colon, in the distal descending colon and in the ascending colon. - Non-bleeding internal hemorrhoids. - The examined portion of the ileum was normal.    01/11/2018 Pathology Results    1. Colon, biopsy, hepatic flexure -  ADENOCARCINOMA, SEE COMMENT. 2. Colon, polyp(s), sigmoid, polyp (2) - HYPERPLASTIC POLYP (X2 FRAGMENTS). - NO DYSPLASIA OR MALIGNANCY. Microscopic Comment 1. There are additional fragments of tubular adenoma suggesting this is a primary colon adenocarcinoma.     02/07/2018 Cancer Staging    Staging form: Colon and Rectum, AJCC 8th Edition - Clinical: Stage I (cT1, cN0, cM0) - Signed by Heath Lark, MD on 02/07/2018     REVIEW OF SYSTEMS:   Constitutional: Denies fevers, chills or abnormal weight loss Eyes: Denies blurriness of vision Ears, nose, mouth, throat, and face: Denies mucositis or sore throat Respiratory: Denies cough, dyspnea or wheezes Cardiovascular: Denies palpitation, chest discomfort or lower extremity swelling Gastrointestinal:  Denies nausea, heartburn or change in bowel habits Skin: Denies abnormal skin rashes Lymphatics: Denies new lymphadenopathy or easy bruising Neurological:Denies numbness, tingling or new weaknesses Behavioral/Psych: Mood is stable, no new changes  All other systems were reviewed with the patient and are negative.  I have reviewed the past medical history, past surgical history, social history and family history with the patient and they are unchanged from previous note.  ALLERGIES:  is allergic to clarithromycin; dilacor [diltiazem hcl]; and norvasc [amlodipine besylate].  MEDICATIONS:  Current Outpatient Medications  Medication Sig Dispense Refill  . amLODipine (NORVASC) 10 MG tablet Take 10 mg by mouth daily.     Marland Kitchen  atenolol (TENORMIN) 50 MG tablet Take 50 mg by mouth daily.  1  . Cholecalciferol (VITAMIN D3) 5000 units CAPS Take 5,000 Units by mouth daily.     Marland Kitchen dexamethasone (DECADRON) 4 MG tablet Take 2 tablets by mouth once a day on the day after chemotherapy and then take 2 tablets two times a day for 2 days. Take with food. 30 tablet 1  . fish oil-omega-3 fatty acids 1000 MG capsule Take 1 g by mouth daily.     . hydrocortisone  (ANUSOL-HC) 2.5 % rectal cream Apply 1 application topically 2 (two) times daily. 28.35 g 0  . lidocaine-prilocaine (EMLA) cream Apply to affected area once 30 g 3  . LORazepam (ATIVAN) 0.5 MG tablet Take 1 tablet (0.5 mg total) by mouth every 6 (six) hours as needed (Nausea or vomiting). (Patient not taking: Reported on 01/11/2018) 30 tablet 0  . Multiple Vitamin (MULTIVITAMIN WITH MINERALS) TABS tablet Take 1 tablet by mouth daily.    . ondansetron (ZOFRAN) 8 MG tablet Take 1 tablet (8 mg total) by mouth 2 (two) times daily as needed. Start on the third day after chemotherapy. (Patient not taking: Reported on 01/11/2018) 30 tablet 1  . pramoxine (PROCTOFOAM) 1 % foam Place 1 application rectally 3 (three) times daily as needed for anal itching. 15 g 2  . prochlorperazine (COMPAZINE) 10 MG tablet Take 1 tablet (10 mg total) by mouth every 6 (six) hours as needed (Nausea or vomiting). (Patient not taking: Reported on 01/11/2018) 30 tablet 1  . spironolactone (ALDACTONE) 25 MG tablet Take 1 tablet (25 mg total) by mouth daily. 90 tablet 3   No current facility-administered medications for this visit.     PHYSICAL EXAMINATION: ECOG PERFORMANCE STATUS: 1 - Symptomatic but completely ambulatory  Vitals:   02/14/18 0822  BP: (!) 149/71  Pulse: 72  Resp: 18  Temp: 98.4 F (36.9 C)  SpO2: 100%   Filed Weights   02/14/18 0822  Weight: 183 lb (83 kg)    GENERAL:alert, no distress and comfortable SKIN: skin color, texture, turgor are normal, no rashes or significant lesions EYES: normal, Conjunctiva are pink and non-injected, sclera clear OROPHARYNX:no exudate, no erythema and lips, buccal mucosa, and tongue normal  NECK: supple, thyroid normal size, non-tender, without nodularity LYMPH:  no palpable lymphadenopathy in the cervical, axillary or inguinal LUNGS: clear to auscultation and percussion with normal breathing effort HEART: regular rate & rhythm and no murmurs and no lower extremity  edema ABDOMEN:abdomen soft, non-tender and normal bowel sounds Musculoskeletal:no cyanosis of digits and no clubbing  NEURO: alert & oriented x 3 with fluent speech, no focal motor/sensory deficits  LABORATORY DATA:  I have reviewed the data as listed    Component Value Date/Time   NA 135 02/13/2018 0832   K 4.3 02/13/2018 0832   CL 103 02/13/2018 0832   CO2 24 02/13/2018 0832   GLUCOSE 114 (H) 02/13/2018 0832   BUN 18 02/13/2018 0832   CREATININE 0.78 02/13/2018 0832   CREATININE 0.69 01/10/2018 1023   CALCIUM 9.4 02/13/2018 0832   PROT 6.0 (L) 02/13/2018 0832   ALBUMIN 3.0 (L) 02/13/2018 0832   AST 11 (L) 02/13/2018 0832   AST 12 (L) 12/28/2017 1333   ALT 25 02/13/2018 0832   ALT 13 12/28/2017 1333   ALKPHOS 63 02/13/2018 0832   BILITOT <0.2 (L) 02/13/2018 0832   BILITOT 0.4 12/28/2017 1333   GFRNONAA >60 02/13/2018 0832   GFRNONAA >60 01/10/2018 1023  GFRAA >60 02/13/2018 0832   GFRAA >60 01/10/2018 1023    No results found for: SPEP, UPEP  Lab Results  Component Value Date   WBC 8.2 02/13/2018   NEUTROABS 6.9 02/13/2018   HGB 10.1 (L) 02/13/2018   HCT 29.4 (L) 02/13/2018   MCV 87.0 02/13/2018   PLT 275 02/13/2018      Chemistry      Component Value Date/Time   NA 135 02/13/2018 0832   K 4.3 02/13/2018 0832   CL 103 02/13/2018 0832   CO2 24 02/13/2018 0832   BUN 18 02/13/2018 0832   CREATININE 0.78 02/13/2018 0832   CREATININE 0.69 01/10/2018 1023      Component Value Date/Time   CALCIUM 9.4 02/13/2018 0832   ALKPHOS 63 02/13/2018 0832   AST 11 (L) 02/13/2018 0832   AST 12 (L) 12/28/2017 1333   ALT 25 02/13/2018 0832   ALT 13 12/28/2017 1333   BILITOT <0.2 (L) 02/13/2018 0832   BILITOT 0.4 12/28/2017 1333      All questions were answered. The patient knows to call the clinic with any problems, questions or concerns. No barriers to learning was detected.  I spent 15 minutes counseling the patient face to face. The total time spent in the  appointment was 20 minutes and more than 50% was on counseling and review of test results  Heath Lark, MD 02/14/2018 3:00 PM

## 2018-02-14 NOTE — Assessment & Plan Note (Signed)
This is likely due to recent treatment. The patient denies recent history of bleeding such as epistaxis, hematuria or hematochezia. She is asymptomatic from the anemia. I will observe for now.   

## 2018-02-14 NOTE — Assessment & Plan Note (Signed)
We discussed conservative management for now. Overall, it is improving.

## 2018-02-14 NOTE — Assessment & Plan Note (Signed)
She complains of hemorrhoidal bleeding/pain due to frequent bowel movement. We discussed conservative management.  We will proceed with chemotherapy as scheduled Since she tolerated treatment well, if her blood counts are satisfactory, she might get 6 dose of chemotherapy next week She will get repeat imaging study within 4 to 6 weeks after radiation therapy is completed to monitor the status of her response to treatment So far, she tolerated weekly chemotherapy well without major side effects

## 2018-02-14 NOTE — Assessment & Plan Note (Signed)
She is currently not symptomatic.  She will receive palliative radiation therapy to her bone

## 2018-02-14 NOTE — Telephone Encounter (Signed)
.  nav

## 2018-02-14 NOTE — Telephone Encounter (Signed)
Entered in error

## 2018-02-14 NOTE — Telephone Encounter (Signed)
No los °

## 2018-02-15 ENCOUNTER — Inpatient Hospital Stay: Payer: BC Managed Care – PPO

## 2018-02-15 ENCOUNTER — Ambulatory Visit
Admission: RE | Admit: 2018-02-15 | Discharge: 2018-02-15 | Disposition: A | Discharge status: Home / Self Care | Payer: BC Managed Care – PPO | Source: Ambulatory Visit | Attending: Radiation Oncology | Admitting: Radiation Oncology

## 2018-02-15 VITALS — BP 156/88 | HR 72 | Temp 99.6°F | Resp 18

## 2018-02-15 DIAGNOSIS — C530 Malignant neoplasm of endocervix: Secondary | ICD-10-CM | POA: Diagnosis not present

## 2018-02-15 MED ORDER — HEPARIN SOD (PORK) LOCK FLUSH 100 UNIT/ML IV SOLN
500.0000 [IU] | Freq: Once | INTRAVENOUS | Status: AC | PRN
  Administered 2018-02-15: 500 [IU]
  Filled 2018-02-15: qty 5

## 2018-02-15 MED ORDER — POTASSIUM CHLORIDE 2 MEQ/ML IV SOLN
Freq: Once | INTRAVENOUS | Status: AC
  Administered 2018-02-15: 10:00:00 via INTRAVENOUS
  Filled 2018-02-15: qty 10

## 2018-02-15 MED ORDER — SODIUM CHLORIDE 0.9% FLUSH
10.0000 mL | INTRAVENOUS | Status: DC | PRN
  Administered 2018-02-15: 10 mL
  Filled 2018-02-15: qty 10

## 2018-02-15 MED ORDER — SODIUM CHLORIDE 0.9 % IV SOLN
Freq: Once | INTRAVENOUS | Status: AC
  Administered 2018-02-15: 12:00:00 via INTRAVENOUS
  Filled 2018-02-15: qty 5

## 2018-02-15 MED ORDER — PALONOSETRON HCL INJECTION 0.25 MG/5ML
0.2500 mg | Freq: Once | INTRAVENOUS | Status: AC
  Administered 2018-02-15: 0.25 mg via INTRAVENOUS

## 2018-02-15 MED ORDER — SODIUM CHLORIDE 0.9 % IV SOLN
Freq: Once | INTRAVENOUS | Status: AC
  Administered 2018-02-15: 09:00:00 via INTRAVENOUS
  Filled 2018-02-15: qty 250

## 2018-02-15 MED ORDER — SODIUM CHLORIDE 0.9 % IV SOLN
40.0000 mg/m2 | Freq: Once | INTRAVENOUS | Status: AC
  Administered 2018-02-15: 76 mg via INTRAVENOUS
  Filled 2018-02-15: qty 76

## 2018-02-15 MED ORDER — PALONOSETRON HCL INJECTION 0.25 MG/5ML
INTRAVENOUS | Status: AC
  Filled 2018-02-15: qty 5

## 2018-02-15 NOTE — Patient Instructions (Signed)
[facility] Discharge Instructions for Patients Receiving Chemotherapy  Today you received the following chemotherapy agent:  Cisplatin  To help prevent nausea and vomiting after your treatment, we encourage you to take your nausea medication as instructed.   If you develop nausea and vomiting that is not controlled by your nausea medication, call the clinic.   BELOW ARE SYMPTOMS THAT SHOULD BE REPORTED IMMEDIATELY:  *FEVER GREATER THAN 100.5 F  *CHILLS WITH OR WITHOUT FEVER  NAUSEA AND VOMITING THAT IS NOT CONTROLLED WITH YOUR NAUSEA MEDICATION  *UNUSUAL SHORTNESS OF BREATH  *UNUSUAL BRUISING OR BLEEDING  TENDERNESS IN MOUTH AND THROAT WITH OR WITHOUT PRESENCE OF ULCERS  *URINARY PROBLEMS  *BOWEL PROBLEMS  UNUSUAL RASH Items with * indicate a potential emergency and should be followed up as soon as possible.  Feel free to call the clinic should you have any questions or concerns. The clinic phone number is [phone].  Please show the [place] at check-in to the Emergency Department and triage nurse.

## 2018-02-16 ENCOUNTER — Ambulatory Visit
Admission: RE | Admit: 2018-02-16 | Discharge: 2018-02-16 | Disposition: A | Discharge status: Home / Self Care | Payer: BC Managed Care – PPO | Source: Ambulatory Visit | Attending: Radiation Oncology | Admitting: Radiation Oncology

## 2018-02-16 ENCOUNTER — Other Ambulatory Visit: Payer: Self-pay

## 2018-02-16 ENCOUNTER — Encounter (HOSPITAL_BASED_OUTPATIENT_CLINIC_OR_DEPARTMENT_OTHER): Payer: Self-pay | Admitting: *Deleted

## 2018-02-16 DIAGNOSIS — C530 Malignant neoplasm of endocervix: Secondary | ICD-10-CM | POA: Diagnosis not present

## 2018-02-16 NOTE — Progress Notes (Signed)
Spoke with patient via telephone for pre op interview. NPO after MN. Patient to take norvasc and atenolol AM of surgery with a sip of water. Patient verbalized understanding of needing a ride DOS, but does not know who will be with her. Needs ISTAT8 AM of surgery.

## 2018-02-17 ENCOUNTER — Encounter: Payer: Self-pay | Admitting: Oncology

## 2018-02-17 ENCOUNTER — Ambulatory Visit
Admission: RE | Admit: 2018-02-17 | Discharge: 2018-02-17 | Disposition: A | Discharge status: Home / Self Care | Payer: BC Managed Care – PPO | Source: Ambulatory Visit | Attending: Radiation Oncology | Admitting: Radiation Oncology

## 2018-02-17 DIAGNOSIS — C530 Malignant neoplasm of endocervix: Secondary | ICD-10-CM | POA: Diagnosis not present

## 2018-02-20 ENCOUNTER — Inpatient Hospital Stay: Payer: BC Managed Care – PPO

## 2018-02-20 ENCOUNTER — Ambulatory Visit
Admission: RE | Admit: 2018-02-20 | Discharge: 2018-02-20 | Disposition: A | Discharge status: Home / Self Care | Payer: BC Managed Care – PPO | Source: Ambulatory Visit | Attending: Radiation Oncology | Admitting: Radiation Oncology

## 2018-02-20 DIAGNOSIS — C530 Malignant neoplasm of endocervix: Secondary | ICD-10-CM | POA: Diagnosis not present

## 2018-02-20 DIAGNOSIS — C7951 Secondary malignant neoplasm of bone: Secondary | ICD-10-CM

## 2018-02-20 DIAGNOSIS — C539 Malignant neoplasm of cervix uteri, unspecified: Secondary | ICD-10-CM

## 2018-02-20 LAB — COMPREHENSIVE METABOLIC PANEL
ALT: 32 U/L (ref 0–44)
AST: 14 U/L — ABNORMAL LOW (ref 15–41)
Albumin: 2.9 g/dL — ABNORMAL LOW (ref 3.5–5.0)
Alkaline Phosphatase: 63 U/L (ref 38–126)
Anion gap: 6 (ref 5–15)
BUN: 20 mg/dL (ref 8–23)
CO2: 25 mmol/L (ref 22–32)
Calcium: 8.6 mg/dL — ABNORMAL LOW (ref 8.9–10.3)
Chloride: 102 mmol/L (ref 98–111)
Creatinine, Ser: 0.73 mg/dL (ref 0.44–1.00)
GFR calc Af Amer: >60 mL/min (ref >60)
GFR calc non Af Amer: >60 mL/min (ref >60)
Glucose, Bld: 101 mg/dL — ABNORMAL HIGH (ref 70–99)
Potassium: 4 mmol/L (ref 3.5–5.1)
Sodium: 133 mmol/L — ABNORMAL LOW (ref 135–145)
Total Bilirubin: 0.3 mg/dL (ref 0.3–1.2)
Total Protein: 5.6 g/dL — ABNORMAL LOW (ref 6.5–8.1)

## 2018-02-20 LAB — CBC WITH DIFFERENTIAL/PLATELET
Abs Immature Granulocytes: 0.09 10*3/uL — ABNORMAL HIGH (ref 0.00–0.07)
BASOS ABS: 0 10*3/uL (ref 0.0–0.1)
Basophils Relative: 0 %
Eosinophils Absolute: 0 10*3/uL (ref 0.0–0.5)
Eosinophils Relative: 0 %
HCT: 28.8 % — ABNORMAL LOW (ref 36.0–46.0)
Hemoglobin: 9.7 g/dL — ABNORMAL LOW (ref 12.0–15.0)
IMMATURE GRANULOCYTES: 3 %
Lymphocytes Relative: 3 %
Lymphs Abs: 0.1 10*3/uL — ABNORMAL LOW (ref 0.7–4.0)
MCH: 29.7 pg (ref 26.0–34.0)
MCHC: 33.7 g/dL (ref 30.0–36.0)
MCV: 88.1 fL (ref 80.0–100.0)
Monocytes Absolute: 0.2 10*3/uL (ref 0.1–1.0)
Monocytes Relative: 7 %
Neutro Abs: 2.8 10*3/uL (ref 1.7–7.7)
Neutrophils Relative %: 87 %
Platelets: 185 10*3/uL (ref 150–400)
RBC: 3.27 MIL/uL — AB (ref 3.87–5.11)
RDW: 17.3 % — ABNORMAL HIGH (ref 11.5–15.5)
WBC: 3.2 10*3/uL — AB (ref 4.0–10.5)
nRBC: 0.9 % — ABNORMAL HIGH (ref 0.0–0.2)

## 2018-02-20 LAB — MAGNESIUM: Magnesium: 1.6 mg/dL — ABNORMAL LOW (ref 1.7–2.4)

## 2018-02-20 MED ORDER — HEPARIN SOD (PORK) LOCK FLUSH 100 UNIT/ML IV SOLN
500.0000 [IU] | Freq: Once | INTRAVENOUS | Status: AC
  Administered 2018-02-20: 500 [IU]
  Filled 2018-02-20: qty 5

## 2018-02-20 MED ORDER — SODIUM CHLORIDE 0.9% FLUSH
10.0000 mL | Freq: Once | INTRAVENOUS | Status: AC
  Administered 2018-02-20: 10 mL
  Filled 2018-02-20: qty 10

## 2018-02-21 ENCOUNTER — Inpatient Hospital Stay (HOSPITAL_BASED_OUTPATIENT_CLINIC_OR_DEPARTMENT_OTHER): Payer: BC Managed Care – PPO | Admitting: Hematology and Oncology

## 2018-02-21 ENCOUNTER — Telehealth: Payer: Self-pay | Admitting: Hematology and Oncology

## 2018-02-21 ENCOUNTER — Encounter: Payer: Self-pay | Admitting: Hematology and Oncology

## 2018-02-21 ENCOUNTER — Ambulatory Visit
Admission: RE | Admit: 2018-02-21 | Discharge: 2018-02-21 | Disposition: A | Discharge status: Home / Self Care | Payer: BC Managed Care – PPO | Source: Ambulatory Visit | Attending: Radiation Oncology | Admitting: Radiation Oncology

## 2018-02-21 DIAGNOSIS — C7951 Secondary malignant neoplasm of bone: Secondary | ICD-10-CM | POA: Diagnosis not present

## 2018-02-21 DIAGNOSIS — C539 Malignant neoplasm of cervix uteri, unspecified: Secondary | ICD-10-CM

## 2018-02-21 DIAGNOSIS — C183 Malignant neoplasm of hepatic flexure: Secondary | ICD-10-CM

## 2018-02-21 DIAGNOSIS — C530 Malignant neoplasm of endocervix: Secondary | ICD-10-CM | POA: Diagnosis not present

## 2018-02-21 DIAGNOSIS — T451X5A Adverse effect of antineoplastic and immunosuppressive drugs, initial encounter: Secondary | ICD-10-CM

## 2018-02-21 DIAGNOSIS — D6481 Anemia due to antineoplastic chemotherapy: Secondary | ICD-10-CM

## 2018-02-21 DIAGNOSIS — D61818 Other pancytopenia: Secondary | ICD-10-CM

## 2018-02-21 DIAGNOSIS — K649 Unspecified hemorrhoids: Secondary | ICD-10-CM

## 2018-02-21 NOTE — Progress Notes (Signed)
[place] OFFICE PROGRESS NOTE  Patient Care Team: Antony Contras, MD as PCP - General (Family Medicine) Minus Breeding, MD as PCP - Cardiology (Cardiology)  ASSESSMENT & PLAN:  Cervical cancer [facility]) She complains of hemorrhoidal bleeding/pain due to frequent bowel movement, along with mild pancytopenia We discussed conservative management.  We will proceed with chemotherapy as scheduled, her final dose tomorrow She will get repeat imaging study within 4 to 6 weeks after radiation therapy is completed to monitor the status of her response to treatment So far, she tolerated weekly chemotherapy well without major side effects  Anemia due to antineoplastic chemotherapy This is likely due to recent treatment. The patient denies recent history of bleeding such as epistaxis, hematuria or hematochezia. She is asymptomatic from the anemia. I will observe for now.   Colon cancer [facility]) Recent additional study showed abnormalities with mismatch protein repair I recommend genetic counselor referral due to concurrent cancer diagnosis and possibility of genetic disorder.  She agree with the plan of care She is concerned about coverage for genetic testing.  I will defer to genetic counselor to talk to the patient   No orders of the defined types were placed in this encounter.   INTERVAL HISTORY: Please see below for problem oriented charting. She returns to be seen prior to cycle 6 of chemo She felt well Her hemorrhoid is well controlled The patient denies any recent signs or symptoms of bleeding such as spontaneous epistaxis, hematuria or hematochezia. She denies nausea or vomiting.  No peripheral neuropathy Denies recent infection  SUMMARY OF ONCOLOGIC HISTORY: Oncology History   MSI - Stable on cervix biopsy PD-L1 2%  Bone biopsy is consistent with metastatic GYN primary MMR protein MLH1 and PMS2 abnormalities noted on colon biopsy     Cervical cancer ([place])   09/08/2017  Initial Diagnosis    Patient has a history of symptomatic endometrial fibroids in 2013 for which she underwent hysteroscopic resection with benign pathology.  She has had lifelong normal Pap smears including in 2019.  Of note HPV testing was not performed in this Pap in 2019.  She developed symptoms of very light vaginal spotting in August 2019     11/22/2017 Procedure    She was seen by her gynecologist, Dr. Radene Knee, who performed a pelvic examination.  On physical examination and endocervical mass was appreciated and felt to be likely to be a polyp or fibroid was biopsied on 11/22/17.       11/22/2017 Pathology Results    This revealed poorly differentiated invasive carcinoma.  Immunostains revealed that it was CK7 positive, P 16+, p53 positive, PAX 8+ and CK 5 6 weakly positive.  CK 20, ER PR and p53 immunostains were negative.  The morphology and Immuno profile favored a gynecologic primary with endocervical and endometrial adenocarcinoma in the differential diagnosis.     11/22/2017 Imaging    An ultrasound scan on November 22, 2017 revealed several intramural fibroids.  With saline infusion there was a 1.8 cm density within the endometrial cavity which could be a fibroid or polyp.     12/08/2017 Imaging    PET:  1. Marked hypermetabolism involving the cervix and uterus. Difficult to be certain of the origin but favor cervix. No evidence of parametrial or parauterine tumor and no pelvic or retroperitoneal hypermetabolic lymphadenopathy. 2. L5 vertebral body lesion worrisome for metastasis. 3. Hypermetabolic area involving the hepatic flexure region of the colon is worrisome for colon cancer. Recommend colonoscopy. 4. Benign  left adrenal gland adenoma. 5. Symmetric hypermetabolism in the tongue base and tonsillar regions is likely inflamed lymphoid tissue. No neck mass or adenopathy.    12/19/2017 Imaging    1. Diffuse marrow replacement in the L5 vertebral body, indeterminate though  metastatic disease is a concern. Correlate with upcoming biopsy. No extraosseous tumor or pathologic fracture. 2. Marrow signal abnormality on both sides of the T11-12 disc space favored to be degenerative. 3. Severe L4-5 facet arthrosis with grade 1 anterolisthesis, mild bilateral lateral recess stenosis, and mild-to-moderate bilateral neural foraminal stenosis. Findings may worsen with standing. 4. Moderate bilateral neural foraminal stenosis at L5-S1. 5. Partially visualized uterine masses which may reflect a combination of known adenocarcinoma and fibroids. 4 cm right adnexal mass may reflect an exophytic subserosal fibroid or ovarian neoplasm.    12/20/2017 Echocardiogram    LV EF: 60% -   65%    12/23/2017 Procedure    Successful placement of a right IJ approach Power Port with ultrasound and fluoroscopic guidance. The catheter is ready for use.    12/23/2017 Pathology Results    Bone, biopsy, L5 - METASTATIC POORLY DIFFERENTIATED CARCINOMA. - SEE COMMENT. Microscopic Comment Dr. Vic Ripper has reviewed the case and concurs with this interpretation. The case was discussed wtih Joylene John on 12/22/2017. Per request, a block will be sent for PDL1 testing and the results reported separately    01/11/2018 Cancer Staging    Staging form: Cervix Uteri, AJCC 8th Edition - Clinical: FIGO Stage IVB (cT2, cN0, pM1) - Signed by Heath Lark, MD on 01/11/2018    01/17/2018 -  Chemotherapy    The patient had weekly cisplatin     Colon cancer ([place])   01/11/2018 Procedure    - Hemorrhoids found on perianal exam. - Malignant partially obstructing tumor at the hepatic flexure. Biopsied. Tattooed. - Two 2 to 5 mm polyps in the sigmoid colon, removed with a cold snare. Resected and retrieved. - Diverticulosis in the sigmoid colon, in the distal descending colon and in the ascending colon. - Non-bleeding internal hemorrhoids. - The examined portion of the ileum was normal.    01/11/2018 Pathology  Results    1. Colon, biopsy, hepatic flexure - ADENOCARCINOMA, SEE COMMENT. 2. Colon, polyp(s), sigmoid, polyp (2) - HYPERPLASTIC POLYP (X2 FRAGMENTS). - NO DYSPLASIA OR MALIGNANCY. Microscopic Comment 1. There are additional fragments of tubular adenoma suggesting this is a primary colon adenocarcinoma.     02/07/2018 Cancer Staging    Staging form: Colon and Rectum, AJCC 8th Edition - Clinical: Stage I (cT1, cN0, cM0) - Signed by Heath Lark, MD on 02/07/2018     REVIEW OF SYSTEMS:   Constitutional: Denies fevers, chills or abnormal weight loss Eyes: Denies blurriness of vision Ears, nose, mouth, throat, and face: Denies mucositis or sore throat Respiratory: Denies cough, dyspnea or wheezes Cardiovascular: Denies palpitation, chest discomfort or lower extremity swelling Gastrointestinal:  Denies nausea, heartburn or change in bowel habits Skin: Denies abnormal skin rashes Lymphatics: Denies new lymphadenopathy or easy bruising Neurological:Denies numbness, tingling or new weaknesses Behavioral/Psych: Mood is stable, no new changes  All other systems were reviewed with the patient and are negative.  I have reviewed the past medical history, past surgical history, social history and family history with the patient and they are unchanged from previous note.  ALLERGIES:  is allergic to clarithromycin; dilacor [diltiazem hcl]; and norvasc [amlodipine besylate].  MEDICATIONS:  Current Outpatient Medications  Medication Sig Dispense Refill  . amLODipine (NORVASC) 10 MG  tablet Take 10 mg by mouth daily.     Marland Kitchen atenolol (TENORMIN) 50 MG tablet Take 50 mg by mouth daily.  1  . Cholecalciferol (VITAMIN D3) 5000 units CAPS Take 5,000 Units by mouth daily.     Marland Kitchen dexamethasone (DECADRON) 4 MG tablet Take 2 tablets by mouth once a day on the day after chemotherapy and then take 2 tablets two times a day for 2 days. Take with food. 30 tablet 1  . fish oil-omega-3 fatty acids 1000 MG capsule  Take 1 g by mouth daily.     . hydrocortisone (ANUSOL-HC) 2.5 % rectal cream Apply 1 application topically 2 (two) times daily. 28.35 g 0  . lidocaine-prilocaine (EMLA) cream Apply to affected area once 30 g 3  . LORazepam (ATIVAN) 0.5 MG tablet Take 1 tablet (0.5 mg total) by mouth every 6 (six) hours as needed (Nausea or vomiting). (Patient not taking: Reported on 01/11/2018) 30 tablet 0  . Multiple Vitamin (MULTIVITAMIN WITH MINERALS) TABS tablet Take 1 tablet by mouth daily.    . ondansetron (ZOFRAN) 8 MG tablet Take 1 tablet (8 mg total) by mouth 2 (two) times daily as needed. Start on the third day after chemotherapy. (Patient not taking: Reported on 01/11/2018) 30 tablet 1  . pramoxine (PROCTOFOAM) 1 % foam Place 1 application rectally 3 (three) times daily as needed for anal itching. 15 g 2  . prochlorperazine (COMPAZINE) 10 MG tablet Take 1 tablet (10 mg total) by mouth every 6 (six) hours as needed (Nausea or vomiting). (Patient not taking: Reported on 01/11/2018) 30 tablet 1  . spironolactone (ALDACTONE) 25 MG tablet Take 1 tablet (25 mg total) by mouth daily. 90 tablet 3   No current facility-administered medications for this visit.     PHYSICAL EXAMINATION: ECOG PERFORMANCE STATUS: 1 - Symptomatic but completely ambulatory  Vitals:   02/21/18 1035  BP: 139/70  Pulse: 77  Resp: 18  Temp: 99 F (37.2 C)  SpO2: 100%   Filed Weights   02/21/18 1035  Weight: 178 lb 3.2 oz (80.8 kg)    GENERAL:alert, no distress and comfortable SKIN: skin color, texture, turgor are normal, no rashes or significant lesions EYES: normal, Conjunctiva are pink and non-injected, sclera clear OROPHARYNX:no exudate, no erythema and lips, buccal mucosa, and tongue normal  NECK: supple, thyroid normal size, non-tender, without nodularity LYMPH:  no palpable lymphadenopathy in the cervical, axillary or inguinal LUNGS: clear to auscultation and percussion with normal breathing effort HEART: regular rate  & rhythm and no murmurs and no lower extremity edema ABDOMEN:abdomen soft, non-tender and normal bowel sounds Musculoskeletal:no cyanosis of digits and no clubbing  NEURO: alert & oriented x 3 with fluent speech, no focal motor/sensory deficits  LABORATORY DATA:  I have reviewed the data as listed    Component Value Date/Time   NA 133 (L) 02/20/2018 0840   K 4.0 02/20/2018 0840   CL 102 02/20/2018 0840   CO2 25 02/20/2018 0840   GLUCOSE 101 (H) 02/20/2018 0840   BUN 20 02/20/2018 0840   CREATININE 0.73 02/20/2018 0840   CREATININE 0.69 01/10/2018 1023   CALCIUM 8.6 (L) 02/20/2018 0840   PROT 5.6 (L) 02/20/2018 0840   ALBUMIN 2.9 (L) 02/20/2018 0840   AST 14 (L) 02/20/2018 0840   AST 12 (L) 12/28/2017 1333   ALT 32 02/20/2018 0840   ALT 13 12/28/2017 1333   ALKPHOS 63 02/20/2018 0840   BILITOT 0.3 02/20/2018 0840   BILITOT 0.4 12/28/2017  [place] 02/20/2018 0840   GFRNONAA >60 01/10/2018 1023   GFRAA >60 02/20/2018 0840   GFRAA >60 01/10/2018 1023    No results found for: SPEP, UPEP  Lab Results  Component Value Date   WBC 3.2 (L) 02/20/2018   NEUTROABS 2.8 02/20/2018   HGB 9.7 (L) 02/20/2018   HCT 28.8 (L) 02/20/2018   MCV 88.1 02/20/2018   PLT 185 02/20/2018      Chemistry      Component Value Date/Time   NA 133 (L) 02/20/2018 0840   K 4.0 02/20/2018 0840   CL 102 02/20/2018 0840   CO2 25 02/20/2018 0840   BUN 20 02/20/2018 0840   CREATININE 0.73 02/20/2018 0840   CREATININE 0.69 01/10/2018 1023      Component Value Date/Time   CALCIUM 8.6 (L) 02/20/2018 0840   ALKPHOS 63 02/20/2018 0840   AST 14 (L) 02/20/2018 0840   AST 12 (L) 12/28/2017 1333   ALT 32 02/20/2018 0840   ALT 13 12/28/2017 1333   BILITOT 0.3 02/20/2018 0840   BILITOT 0.4 12/28/2017 1333     All questions were answered. The patient knows to call the clinic with any problems, questions or concerns. No barriers to learning was detected.  I spent 15 minutes counseling the  patient face to face. The total time spent in the appointment was 20 minutes and more than 50% was on counseling and review of test results  Heath Lark, MD 02/21/2018 11:14 AM

## 2018-02-21 NOTE — Assessment & Plan Note (Signed)
She complains of hemorrhoidal bleeding/pain due to frequent bowel movement, along with mild pancytopenia We discussed conservative management.  We will proceed with chemotherapy as scheduled, her final dose tomorrow She will get repeat imaging study within 4 to 6 weeks after radiation therapy is completed to monitor the status of her response to treatment So far, she tolerated weekly chemotherapy well without major side effects

## 2018-02-21 NOTE — Assessment & Plan Note (Signed)
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  She agree with the plan of care She is concerned about coverage for genetic testing.  I will defer to genetic counselor to talk to the patient

## 2018-02-21 NOTE — Telephone Encounter (Signed)
Gave avs and calendar ° °

## 2018-02-21 NOTE — Assessment & Plan Note (Signed)
This is likely due to recent treatment. The patient denies recent history of bleeding such as epistaxis, hematuria or hematochezia. She is asymptomatic from the anemia. I will observe for now.   

## 2018-02-22 ENCOUNTER — Inpatient Hospital Stay: Payer: BC Managed Care – PPO

## 2018-02-22 ENCOUNTER — Other Ambulatory Visit: Payer: Self-pay | Admitting: Radiation Oncology

## 2018-02-22 ENCOUNTER — Other Ambulatory Visit (HOSPITAL_COMMUNITY): Payer: Self-pay | Admitting: Radiation Oncology

## 2018-02-22 ENCOUNTER — Inpatient Hospital Stay: Payer: BC Managed Care – PPO | Admitting: Nutrition

## 2018-02-22 VITALS — BP 148/85 | HR 69 | Temp 98.6°F | Resp 18

## 2018-02-22 DIAGNOSIS — Z452 Encounter for adjustment and management of vascular access device: Secondary | ICD-10-CM | POA: Diagnosis not present

## 2018-02-22 DIAGNOSIS — C183 Malignant neoplasm of hepatic flexure: Secondary | ICD-10-CM | POA: Diagnosis not present

## 2018-02-22 DIAGNOSIS — C7951 Secondary malignant neoplasm of bone: Secondary | ICD-10-CM | POA: Insufficient documentation

## 2018-02-22 DIAGNOSIS — D61818 Other pancytopenia: Secondary | ICD-10-CM | POA: Insufficient documentation

## 2018-02-22 DIAGNOSIS — C539 Malignant neoplasm of cervix uteri, unspecified: Secondary | ICD-10-CM | POA: Insufficient documentation

## 2018-02-22 DIAGNOSIS — Z5111 Encounter for antineoplastic chemotherapy: Secondary | ICD-10-CM | POA: Diagnosis present

## 2018-02-22 DIAGNOSIS — K649 Unspecified hemorrhoids: Secondary | ICD-10-CM | POA: Diagnosis not present

## 2018-02-22 DIAGNOSIS — D6481 Anemia due to antineoplastic chemotherapy: Secondary | ICD-10-CM | POA: Diagnosis not present

## 2018-02-22 DIAGNOSIS — C530 Malignant neoplasm of endocervix: Secondary | ICD-10-CM

## 2018-02-22 MED ORDER — SODIUM CHLORIDE 0.9 % IV SOLN
40.0000 mg/m2 | Freq: Once | INTRAVENOUS | Status: AC
  Administered 2018-02-22: 76 mg via INTRAVENOUS
  Filled 2018-02-22: qty 76

## 2018-02-22 MED ORDER — HEPARIN SOD (PORK) LOCK FLUSH 100 UNIT/ML IV SOLN
500.0000 [IU] | Freq: Once | INTRAVENOUS | Status: AC | PRN
  Administered 2018-02-22: 500 [IU]
  Filled 2018-02-22: qty 5

## 2018-02-22 MED ORDER — PALONOSETRON HCL INJECTION 0.25 MG/5ML
INTRAVENOUS | Status: AC
  Filled 2018-02-22: qty 5

## 2018-02-22 MED ORDER — SODIUM CHLORIDE 0.9 % IV SOLN
Freq: Once | INTRAVENOUS | Status: AC
  Administered 2018-02-22: 12:00:00 via INTRAVENOUS
  Filled 2018-02-22: qty 5

## 2018-02-22 MED ORDER — PALONOSETRON HCL INJECTION 0.25 MG/5ML
0.2500 mg | Freq: Once | INTRAVENOUS | Status: AC
  Administered 2018-02-22: 0.25 mg via INTRAVENOUS

## 2018-02-22 MED ORDER — SODIUM CHLORIDE 0.9 % IV SOLN
Freq: Once | INTRAVENOUS | Status: AC
  Administered 2018-02-22: 09:00:00 via INTRAVENOUS
  Filled 2018-02-22: qty 250

## 2018-02-22 MED ORDER — SODIUM CHLORIDE 0.9% FLUSH
10.0000 mL | INTRAVENOUS | Status: DC | PRN
  Administered 2018-02-22: 10 mL
  Filled 2018-02-22: qty 10

## 2018-02-22 MED ORDER — POTASSIUM CHLORIDE 2 MEQ/ML IV SOLN
Freq: Once | INTRAVENOUS | Status: AC
  Administered 2018-02-22: 10:00:00 via INTRAVENOUS
  Filled 2018-02-22: qty 10

## 2018-02-22 NOTE — Patient Instructions (Signed)
[facility] Discharge Instructions for Patients Receiving Chemotherapy  Today you received the following chemotherapy agent:  Cisplatin  To help prevent nausea and vomiting after your treatment, we encourage you to take your nausea medication as instructed.   If you develop nausea and vomiting that is not controlled by your nausea medication, call the clinic.   BELOW ARE SYMPTOMS THAT SHOULD BE REPORTED IMMEDIATELY:  *FEVER GREATER THAN 100.5 F  *CHILLS WITH OR WITHOUT FEVER  NAUSEA AND VOMITING THAT IS NOT CONTROLLED WITH YOUR NAUSEA MEDICATION  *UNUSUAL SHORTNESS OF BREATH  *UNUSUAL BRUISING OR BLEEDING  TENDERNESS IN MOUTH AND THROAT WITH OR WITHOUT PRESENCE OF ULCERS  *URINARY PROBLEMS  *BOWEL PROBLEMS  UNUSUAL RASH Items with * indicate a potential emergency and should be followed up as soon as possible.  Feel free to call the clinic should you have any questions or concerns. The clinic phone number is [phone].  Please show the [facility] at check-in to the Emergency Department and triage nurse.

## 2018-02-22 NOTE — Progress Notes (Signed)
Nutrition follow-up completed with patient during infusion for stage IV cervical carcinoma. Weight is stable at 178 pounds January 14. Patient weighed 179 pounds December 17. Patient denies nutrition impact symptoms however reports one episode of incontinence of stool today. She denies diarrhea.  Nutrition diagnosis: Food and nutrition related knowledge deficit resolved.  Provided support and encouragement for patient to continue strategies for adequate calorie and protein intake. Recommended patient contact me if she has any questions or concerns. Patient has my contact information.

## 2018-02-26 NOTE — H&P (View-Only) (Signed)
Radiation Oncology         [phone] ________________________________  Initial Outpatient Consultation  Name: Toni Peterson MRN: [id number]  Date: 02/14/2018  DOB: 12/24/1950  WN:IOEVOJ, Toni Brow, MD  No ref. provider found   REFERRING PHYSICIAN: No ref. 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 female who is presented earlier to the office today for evaluation of  IB3 endocervical vs stage IV  poorly differentiated adenocarcinoma. She is accompanied by her cousin.    She developed symptoms of very light vaginal spotting in August 2019 and was seen by her gynecologist, Dr. Radene Knee, who performed a pelvic examination.  On physical examination and endocervical mass was appreciated and felt to be likely to be a polyp or fibroid was biopsied on 11/22/17.  This revealed poorly differentiated invasive carcinoma.  Immunostains revealed that it was CK7 positive, P 16+, p53 positive, PAX 8+ and CK 5 6 weakly positive.  CK 20, ER PR and p53 immunostains were negative.  The morphology and Immuno profile favored a gynecologic primary with endocervical and endometrial adenocarcinoma in the differential diagnosis.  She was ultimately at least found to have stage IV adenocarcinoma of the cervix with isolated metastasis within the L5 area.  Patient has recently completed her external beam and radiosensitizing chemotherapy. Overall she tolerated this treatment well. The patient presents for her first brachytherapy procedure, 5 planned.  she reports associated anxiety, and constipation. she denies chest pain, low back pain, incontinence, headaches, visual problems, cough, breathing problems, numbness or swelling in the lower extremities and any other symptoms.    PREVIOUS RADIATION THERAPY: No  PAST MEDICAL HISTORY:  has a past medical history of Cancer ([place]), Diabetes  ([place]), Hyperlipidemia, Hypertension, Other abnormal glucose, and PONV (postoperative nausea and vomiting).    PAST SURGICAL HISTORY: Past Surgical History:  Procedure Laterality Date  . ADENOIDECTOMY    . COLONOSCOPY  2013   said it was in [place]   . DILATION AND CURETTAGE OF UTERUS    . DILATION AND CURETTAGE OF UTERUS  03/11/2011   Procedure: DILATATION AND CURETTAGE;  Surgeon: Darlyn Chamber, MD;  Location: [facility];  Service: Gynecology;  Laterality: N/A;  . HYSTEROSCOPY W/D&C  09-02-2006   ENDOMETRIAL BX'S  . HYSTEROSCOPY WITH RESECTOSCOPE  03/11/2011   Procedure: HYSTEROSCOPY WITH RESECTOSCOPE;  Surgeon: Darlyn Chamber, MD;  Location: [facility];  Service: Gynecology;  Laterality: N/A;  . IR FLUORO GUIDED NEEDLE PLC ASPIRATION/INJECTION LOC  12/21/2017  . IR IMAGING GUIDED PORT INSERTION  12/23/2017    FAMILY HISTORY: family history includes Cancer in her cousin and paternal aunt; Heart attack in her mother; Hyperlipidemia in an other family member; Hypertension in her father, mother, and another family member; Stroke in her brother.  SOCIAL HISTORY:  reports that she has quit smoking. She has never used smokeless tobacco. She reports that she does not drink alcohol or use drugs.  ALLERGIES: Clarithromycin; Dilacor [diltiazem hcl]; and Norvasc [amlodipine besylate]  MEDICATIONS:  No current facility-administered medications for this encounter.    Current Outpatient Medications  Medication Sig Dispense Refill  . amLODipine (NORVASC) 10 MG tablet Take 10 mg by mouth daily.     Marland Kitchen atenolol (TENORMIN) 50 MG tablet Take 50 mg by mouth daily.  1  . spironolactone (ALDACTONE) 25 MG tablet Take 1 tablet (25 mg total) by  mouth daily. 90 tablet 3  . Cholecalciferol (VITAMIN D3) 5000 units CAPS Take 5,000 Units by mouth daily.     Marland Kitchen dexamethasone (DECADRON) 4 MG tablet Take 2 tablets by mouth once a day on the day after chemotherapy and then take 2  tablets two times a day for 2 days. Take with food. 30 tablet 1  . fish oil-omega-3 fatty acids 1000 MG capsule Take 1 g by mouth daily.     . hydrocortisone (ANUSOL-HC) 2.5 % rectal cream Apply 1 application topically 2 (two) times daily. 28.35 g 0  . lidocaine-prilocaine (EMLA) cream Apply to affected area once 30 g 3  . LORazepam (ATIVAN) 0.5 MG tablet Take 1 tablet (0.5 mg total) by mouth every 6 (six) hours as needed (Nausea or vomiting). (Patient not taking: Reported on 01/11/2018) 30 tablet 0  . Multiple Vitamin (MULTIVITAMIN WITH MINERALS) TABS tablet Take 1 tablet by mouth daily.    . ondansetron (ZOFRAN) 8 MG tablet Take 1 tablet (8 mg total) by mouth 2 (two) times daily as needed. Start on the third day after chemotherapy. (Patient not taking: Reported on 01/11/2018) 30 tablet 1  . pramoxine (PROCTOFOAM) 1 % foam Place 1 application rectally 3 (three) times daily as needed for anal itching. 15 g 2  . prochlorperazine (COMPAZINE) 10 MG tablet Take 1 tablet (10 mg total) by mouth every 6 (six) hours as needed (Nausea or vomiting). (Patient not taking: Reported on 01/11/2018) 30 tablet 1    REVIEW OF SYSTEMS:  A 10+ POINT REVIEW OF SYSTEMS WAS OBTAINED including neurology, dermatology, psychiatry, cardiac, respiratory, lymph, extremities, GI, GU, musculoskeletal, constitutional, reproductive, HEENT. All pertinent positives are noted in the HPI. All others are negative.    PHYSICAL EXAM:  vitals were not taken for this visit.   General: Alert and oriented, in no acute distress HEENT: Head is normocephalic. Extraocular movements are intact. Oropharynx is clear. Neck: Neck is supple, no palpable cervical or supraclavicular lymphadenopathy. Heart: Regular in rate and rhythm with no murmurs, rubs, or gallops. Chest: Clear to auscultation bilaterally, with no rhonchi, wheezes, or rales. Abdomen: Soft, nontender, nondistended, with no rigidity or guarding. Extremities: No cyanosis or  edema. Lymphatics: see Neck Exam Skin: No concerning lesions. Musculoskeletal: symmetric strength and muscle tone throughout. Neurologic: Cranial nerves II through XII are grossly intact. No obvious focalities. Speech is fluent. Coordination is intact. Psychiatric: Judgment and insight are intact. Affect is appropriate. Pelvic examination to be performed under general anesthesia in the operating room      ECOG = 0  0 - Asymptomatic (Fully active, able to carry on all predisease activities without restriction)  1 - Symptomatic but completely ambulatory (Restricted in physically strenuous activity but ambulatory and able to carry out work of a light or sedentary nature. For example, light housework, office work)  2 - Symptomatic, <50% in bed during the day (Ambulatory and capable of all self care but unable to carry out any work activities. Up and about more than 50% of waking hours)  3 - Symptomatic, >50% in bed, but not bedbound (Capable of only limited self-care, confined to bed or chair 50% or more of waking hours)  4 - Bedbound (Completely disabled. Cannot carry on any self-care. Totally confined to bed or chair)  5 - Death   Eustace Pen MM, Creech RH, Tormey DC, et al. [phone]). "Toxicity and response criteria of the [facility]". [place] Oncol. 5 (6): 649-55  LABORATORY DATA:  Lab  Results  Component Value Date   WBC 3.2 (L) 02/20/2018   HGB 9.7 (L) 02/20/2018   HCT 28.8 (L) 02/20/2018   MCV 88.1 02/20/2018   PLT 185 02/20/2018   NEUTROABS 2.8 02/20/2018   Lab Results  Component Value Date   NA 133 (L) 02/20/2018   K 4.0 02/20/2018   CL 102 02/20/2018   CO2 25 02/20/2018   GLUCOSE 101 (H) 02/20/2018   CREATININE 0.73 02/20/2018   CALCIUM 8.6 (L) 02/20/2018      RADIOGRAPHY: No results found.    IMPRESSION:    Stage IV endocervical poorly differentiated adenocarcinoma. The patient was found to have a biopsy-proven isolated metastasis at L5.  Her case was presented at the multidisciplinary gynecologic oncology conference and recommendations were for aggressive definitive treatment. Her L5 metastasis has been included in her external beam pelvic radiation field.   PLAN:The patient  will be taken to the operating room on January 21 for her first of 5 planned brachytherapy procedures.    ------------------------------------------------  James D. Kinard, PhD, MD      This document serves as a record of services personally performed by James Kinard, MD. It was created on his behalf by Mary-Margaret Crabb, a trained medical scribe. The creation of this record is based on the scribe's personal observations and the provider's statements to them. This document has been checked and approved by the attending provider.  

## 2018-02-26 NOTE — H&P (View-Only) (Signed)
Radiation Oncology         [phone] ________________________________  Initial Outpatient Consultation  Name: Toni Peterson MRN: [id number]  Date: 02/14/2018  DOB: 1950-10-12  NA:TFTDDU, [name], MD  No ref. provider found   REFERRING PHYSICIAN: No ref. 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 female who is presented earlier to the office today for evaluation of  IB3 endocervical vs stage IV  poorly differentiated adenocarcinoma. She is accompanied by her cousin.    She developed symptoms of very light vaginal spotting in August 2019 and was seen by her gynecologist, Dr. Radene Knee, who performed a pelvic examination.  On physical examination and endocervical mass was appreciated and felt to be likely to be a polyp or fibroid was biopsied on 11/22/17.  This revealed poorly differentiated invasive carcinoma.  Immunostains revealed that it was CK7 positive, P 16+, p53 positive, PAX 8+ and CK 5 6 weakly positive.  CK 20, ER PR and p53 immunostains were negative.  The morphology and Immuno profile favored a gynecologic primary with endocervical and endometrial adenocarcinoma in the differential diagnosis.  She was ultimately at least found to have stage IV adenocarcinoma of the cervix with isolated metastasis within the L5 area.  Patient has recently completed her external beam and radiosensitizing chemotherapy. Overall she tolerated this treatment well. The patient presents for her first brachytherapy procedure, 5 planned.  she reports associated anxiety, and constipation. she denies chest pain, low back pain, incontinence, headaches, visual problems, cough, breathing problems, numbness or swelling in the lower extremities and any other symptoms.    PREVIOUS RADIATION THERAPY: No  PAST MEDICAL HISTORY:  has a past medical history of Cancer ([place]), Diabetes  ([place]), Hyperlipidemia, Hypertension, Other abnormal glucose, and PONV (postoperative nausea and vomiting).    PAST SURGICAL HISTORY: Past Surgical History:  Procedure Laterality Date  . ADENOIDECTOMY    . COLONOSCOPY  2013   said it was in [place]   . DILATION AND CURETTAGE OF UTERUS    . DILATION AND CURETTAGE OF UTERUS  03/11/2011   Procedure: DILATATION AND CURETTAGE;  Surgeon: Darlyn Chamber, MD;  Location: [place];  Service: Gynecology;  Laterality: N/A;  . HYSTEROSCOPY W/D&C  09-02-2006   ENDOMETRIAL BX'S  . HYSTEROSCOPY WITH RESECTOSCOPE  03/11/2011   Procedure: HYSTEROSCOPY WITH RESECTOSCOPE;  Surgeon: Darlyn Chamber, MD;  Location: [facility];  Service: Gynecology;  Laterality: N/A;  . IR FLUORO GUIDED NEEDLE PLC ASPIRATION/INJECTION LOC  12/21/2017  . IR IMAGING GUIDED PORT INSERTION  12/23/2017    FAMILY HISTORY: family history includes Cancer in her cousin and paternal aunt; Heart attack in her mother; Hyperlipidemia in an other family member; Hypertension in her father, mother, and another family member; Stroke in her brother.  SOCIAL HISTORY:  reports that she has quit smoking. She has never used smokeless tobacco. She reports that she does not drink alcohol or use drugs.  ALLERGIES: Clarithromycin; Dilacor [diltiazem hcl]; and Norvasc [amlodipine besylate]  MEDICATIONS:  No current facility-administered medications for this encounter.    Current Outpatient Medications  Medication Sig Dispense Refill  . amLODipine (NORVASC) 10 MG tablet Take 10 mg by mouth daily.     Marland Kitchen atenolol (TENORMIN) 50 MG tablet Take 50 mg by mouth daily.  1  . spironolactone (ALDACTONE) 25 MG tablet Take 1 tablet (25 mg total) by  mouth daily. 90 tablet 3  . Cholecalciferol (VITAMIN D3) 5000 units CAPS Take 5,000 Units by mouth daily.     Marland Kitchen dexamethasone (DECADRON) 4 MG tablet Take 2 tablets by mouth once a day on the day after chemotherapy and then take 2  tablets two times a day for 2 days. Take with food. 30 tablet 1  . fish oil-omega-3 fatty acids 1000 MG capsule Take 1 g by mouth daily.     . hydrocortisone (ANUSOL-HC) 2.5 % rectal cream Apply 1 application topically 2 (two) times daily. 28.35 g 0  . lidocaine-prilocaine (EMLA) cream Apply to affected area once 30 g 3  . LORazepam (ATIVAN) 0.5 MG tablet Take 1 tablet (0.5 mg total) by mouth every 6 (six) hours as needed (Nausea or vomiting). (Patient not taking: Reported on 01/11/2018) 30 tablet 0  . Multiple Vitamin (MULTIVITAMIN WITH MINERALS) TABS tablet Take 1 tablet by mouth daily.    . ondansetron (ZOFRAN) 8 MG tablet Take 1 tablet (8 mg total) by mouth 2 (two) times daily as needed. Start on the third day after chemotherapy. (Patient not taking: Reported on 01/11/2018) 30 tablet 1  . pramoxine (PROCTOFOAM) 1 % foam Place 1 application rectally 3 (three) times daily as needed for anal itching. 15 g 2  . prochlorperazine (COMPAZINE) 10 MG tablet Take 1 tablet (10 mg total) by mouth every 6 (six) hours as needed (Nausea or vomiting). (Patient not taking: Reported on 01/11/2018) 30 tablet 1    REVIEW OF SYSTEMS:  A 10+ POINT REVIEW OF SYSTEMS WAS OBTAINED including neurology, dermatology, psychiatry, cardiac, respiratory, lymph, extremities, GI, GU, musculoskeletal, constitutional, reproductive, HEENT. All pertinent positives are noted in the HPI. All others are negative.    PHYSICAL EXAM:  vitals were not taken for this visit.   General: Alert and oriented, in no acute distress HEENT: Head is normocephalic. Extraocular movements are intact. Oropharynx is clear. Neck: Neck is supple, no palpable cervical or supraclavicular lymphadenopathy. Heart: Regular in rate and rhythm with no murmurs, rubs, or gallops. Chest: Clear to auscultation bilaterally, with no rhonchi, wheezes, or rales. Abdomen: Soft, nontender, nondistended, with no rigidity or guarding. Extremities: No cyanosis or  edema. Lymphatics: see Neck Exam Skin: No concerning lesions. Musculoskeletal: symmetric strength and muscle tone throughout. Neurologic: Cranial nerves II through XII are grossly intact. No obvious focalities. Speech is fluent. Coordination is intact. Psychiatric: Judgment and insight are intact. Affect is appropriate. Pelvic examination to be performed under general anesthesia in the operating room      ECOG = 0  0 - Asymptomatic (Fully active, able to carry on all predisease activities without restriction)  1 - Symptomatic but completely ambulatory (Restricted in physically strenuous activity but ambulatory and able to carry out work of a light or sedentary nature. For example, light housework, office work)  2 - Symptomatic, <50% in bed during the day (Ambulatory and capable of all self care but unable to carry out any work activities. Up and about more than 50% of waking hours)  3 - Symptomatic, >50% in bed, but not bedbound (Capable of only limited self-care, confined to bed or chair 50% or more of waking hours)  4 - Bedbound (Completely disabled. Cannot carry on any self-care. Totally confined to bed or chair)  5 - Death   Eustace Pen MM, Creech RH, Tormey DC, et al. [phone]). "Toxicity and response criteria of the [facility]". [place] Oncol. 5 (6): 649-55  LABORATORY DATA:  Lab  Results  Component Value Date   WBC 3.2 (L) 02/20/2018   HGB 9.7 (L) 02/20/2018   HCT 28.8 (L) 02/20/2018   MCV 88.1 02/20/2018   PLT 185 02/20/2018   NEUTROABS 2.8 02/20/2018   Lab Results  Component Value Date   NA 133 (L) 02/20/2018   K 4.0 02/20/2018   CL 102 02/20/2018   CO2 25 02/20/2018   GLUCOSE 101 (H) 02/20/2018   CREATININE 0.73 02/20/2018   CALCIUM 8.6 (L) 02/20/2018      RADIOGRAPHY: No results found.    IMPRESSION:    Stage IV endocervical poorly differentiated adenocarcinoma. The patient was found to have a biopsy-proven isolated metastasis at L5.  Her case was presented at the multidisciplinary gynecologic oncology conference and recommendations were for aggressive definitive treatment. Her L5 metastasis has been included in her external beam pelvic radiation field.   PLAN:The patient  will be taken to the operating room on January 21 for her first of 5 planned brachytherapy procedures.    ------------------------------------------------  James D. Kinard, PhD, MD      This document serves as a record of services personally performed by James Kinard, MD. It was created on his behalf by Mary-Margaret Crabb, a trained medical scribe. The creation of this record is based on the scribe's personal observations and the provider's statements to them. This document has been checked and approved by the attending provider.  

## 2018-02-26 NOTE — H&P (View-Only) (Signed)
Radiation Oncology         [phone] ________________________________  Initial Outpatient Consultation  Name: Toni Peterson MRN: [id number]  Date: 02/14/2018  DOB: 12/24/1950  WN:IOEVOJ, Toni Brow, MD  No ref. provider found   REFERRING PHYSICIAN: No ref. 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 female who is presented earlier to the office today for evaluation of  IB3 endocervical vs stage IV  poorly differentiated adenocarcinoma. She is accompanied by her cousin.    She developed symptoms of very light vaginal spotting in August 2019 and was seen by her gynecologist, Dr. Radene Knee, who performed a pelvic examination.  On physical examination and endocervical mass was appreciated and felt to be likely to be a polyp or fibroid was biopsied on 11/22/17.  This revealed poorly differentiated invasive carcinoma.  Immunostains revealed that it was CK7 positive, P 16+, p53 positive, PAX 8+ and CK 5 6 weakly positive.  CK 20, ER PR and p53 immunostains were negative.  The morphology and Immuno profile favored a gynecologic primary with endocervical and endometrial adenocarcinoma in the differential diagnosis.  She was ultimately at least found to have stage IV adenocarcinoma of the cervix with isolated metastasis within the L5 area.  Patient has recently completed her external beam and radiosensitizing chemotherapy. Overall she tolerated this treatment well. The patient presents for her first brachytherapy procedure, 5 planned.  she reports associated anxiety, and constipation. she denies chest pain, low back pain, incontinence, headaches, visual problems, cough, breathing problems, numbness or swelling in the lower extremities and any other symptoms.    PREVIOUS RADIATION THERAPY: No  PAST MEDICAL HISTORY:  has a past medical history of Cancer ([place]), Diabetes  ([place]), Hyperlipidemia, Hypertension, Other abnormal glucose, and PONV (postoperative nausea and vomiting).    PAST SURGICAL HISTORY: Past Surgical History:  Procedure Laterality Date  . ADENOIDECTOMY    . COLONOSCOPY  2013   said it was in [place]   . DILATION AND CURETTAGE OF UTERUS    . DILATION AND CURETTAGE OF UTERUS  03/11/2011   Procedure: DILATATION AND CURETTAGE;  Surgeon: Darlyn Chamber, MD;  Location: [place];  Service: Gynecology;  Laterality: N/A;  . HYSTEROSCOPY W/D&C  09-02-2006   ENDOMETRIAL BX'S  . HYSTEROSCOPY WITH RESECTOSCOPE  03/11/2011   Procedure: HYSTEROSCOPY WITH RESECTOSCOPE;  Surgeon: Darlyn Chamber, MD;  Location: [facility];  Service: Gynecology;  Laterality: N/A;  . IR FLUORO GUIDED NEEDLE PLC ASPIRATION/INJECTION LOC  12/21/2017  . IR IMAGING GUIDED PORT INSERTION  12/23/2017    FAMILY HISTORY: family history includes Cancer in her cousin and paternal aunt; Heart attack in her mother; Hyperlipidemia in an other family member; Hypertension in her father, mother, and another family member; Stroke in her brother.  SOCIAL HISTORY:  reports that she has quit smoking. She has never used smokeless tobacco. She reports that she does not drink alcohol or use drugs.  ALLERGIES: Clarithromycin; Dilacor [diltiazem hcl]; and Norvasc [amlodipine besylate]  MEDICATIONS:  No current facility-administered medications for this encounter.    Current Outpatient Medications  Medication Sig Dispense Refill  . amLODipine (NORVASC) 10 MG tablet Take 10 mg by mouth daily.     Marland Kitchen atenolol (TENORMIN) 50 MG tablet Take 50 mg by mouth daily.  1  . spironolactone (ALDACTONE) 25 MG tablet Take 1 tablet (25 mg total) by  mouth daily. 90 tablet 3  . Cholecalciferol (VITAMIN D3) 5000 units CAPS Take 5,000 Units by mouth daily.     Marland Kitchen dexamethasone (DECADRON) 4 MG tablet Take 2 tablets by mouth once a day on the day after chemotherapy and then take 2  tablets two times a day for 2 days. Take with food. 30 tablet 1  . fish oil-omega-3 fatty acids 1000 MG capsule Take 1 g by mouth daily.     . hydrocortisone (ANUSOL-HC) 2.5 % rectal cream Apply 1 application topically 2 (two) times daily. 28.35 g 0  . lidocaine-prilocaine (EMLA) cream Apply to affected area once 30 g 3  . LORazepam (ATIVAN) 0.5 MG tablet Take 1 tablet (0.5 mg total) by mouth every 6 (six) hours as needed (Nausea or vomiting). (Patient not taking: Reported on 01/11/2018) 30 tablet 0  . Multiple Vitamin (MULTIVITAMIN WITH MINERALS) TABS tablet Take 1 tablet by mouth daily.    . ondansetron (ZOFRAN) 8 MG tablet Take 1 tablet (8 mg total) by mouth 2 (two) times daily as needed. Start on the third day after chemotherapy. (Patient not taking: Reported on 01/11/2018) 30 tablet 1  . pramoxine (PROCTOFOAM) 1 % foam Place 1 application rectally 3 (three) times daily as needed for anal itching. 15 g 2  . prochlorperazine (COMPAZINE) 10 MG tablet Take 1 tablet (10 mg total) by mouth every 6 (six) hours as needed (Nausea or vomiting). (Patient not taking: Reported on 01/11/2018) 30 tablet 1    REVIEW OF SYSTEMS:  A 10+ POINT REVIEW OF SYSTEMS WAS OBTAINED including neurology, dermatology, psychiatry, cardiac, respiratory, lymph, extremities, GI, GU, musculoskeletal, constitutional, reproductive, HEENT. All pertinent positives are noted in the HPI. All others are negative.    PHYSICAL EXAM:  vitals were not taken for this visit.   General: Alert and oriented, in no acute distress HEENT: Head is normocephalic. Extraocular movements are intact. Oropharynx is clear. Neck: Neck is supple, no palpable cervical or supraclavicular lymphadenopathy. Heart: Regular in rate and rhythm with no murmurs, rubs, or gallops. Chest: Clear to auscultation bilaterally, with no rhonchi, wheezes, or rales. Abdomen: Soft, nontender, nondistended, with no rigidity or guarding. Extremities: No cyanosis or  edema. Lymphatics: see Neck Exam Skin: No concerning lesions. Musculoskeletal: symmetric strength and muscle tone throughout. Neurologic: Cranial nerves II through XII are grossly intact. No obvious focalities. Speech is fluent. Coordination is intact. Psychiatric: Judgment and insight are intact. Affect is appropriate. Pelvic examination to be performed under general anesthesia in the operating room      ECOG = 0  0 - Asymptomatic (Fully active, able to carry on all predisease activities without restriction)  1 - Symptomatic but completely ambulatory (Restricted in physically strenuous activity but ambulatory and able to carry out work of a light or sedentary nature. For example, light housework, office work)  2 - Symptomatic, <50% in bed during the day (Ambulatory and capable of all self care but unable to carry out any work activities. Up and about more than 50% of waking hours)  3 - Symptomatic, >50% in bed, but not bedbound (Capable of only limited self-care, confined to bed or chair 50% or more of waking hours)  4 - Bedbound (Completely disabled. Cannot carry on any self-care. Totally confined to bed or chair)  5 - Death   Eustace Pen MM, Creech RH, Tormey DC, et al. [phone]). "Toxicity and response criteria of the [facility]". [place] Oncol. 5 (6): 649-55  LABORATORY DATA:  Lab  Results  Component Value Date   WBC 3.2 (L) 02/20/2018   HGB 9.7 (L) 02/20/2018   HCT 28.8 (L) 02/20/2018   MCV 88.1 02/20/2018   PLT 185 02/20/2018   NEUTROABS 2.8 02/20/2018   Lab Results  Component Value Date   NA 133 (L) 02/20/2018   K 4.0 02/20/2018   CL 102 02/20/2018   CO2 25 02/20/2018   GLUCOSE 101 (H) 02/20/2018   CREATININE 0.73 02/20/2018   CALCIUM 8.6 (L) 02/20/2018      RADIOGRAPHY: No results found.    IMPRESSION:    Stage IV endocervical poorly differentiated adenocarcinoma. The patient was found to have a biopsy-proven isolated metastasis at L5.  Her case was presented at the multidisciplinary gynecologic oncology conference and recommendations were for aggressive definitive treatment. Her L5 metastasis has been included in her external beam pelvic radiation field.   PLAN:The patient  will be taken to the operating room on January 21 for her first of 5 planned brachytherapy procedures.    ------------------------------------------------  Sharah Finnell D. Laroy Mustard, PhD, MD      This document serves as a record of services personally performed by Newel Oien, MD. It was created on his behalf by Mary-Margaret Crabb, a trained medical scribe. The creation of this record is based on the scribe's personal observations and the provider's statements to them. This document has been checked and approved by the attending provider.  

## 2018-02-26 NOTE — H&P (View-Only) (Signed)
Radiation Oncology         [phone] ________________________________  Initial Outpatient Consultation  Name: Toni Peterson MRN: [id number]  Date: 02/14/2018  DOB: 12/24/1950  WN:IOEVOJ, Toni Brow, MD  No ref. provider found   REFERRING PHYSICIAN: No ref. 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 female who is presented earlier to the office today for evaluation of  IB3 endocervical vs stage IV  poorly differentiated adenocarcinoma. She is accompanied by her cousin.    She developed symptoms of very light vaginal spotting in August 2019 and was seen by her gynecologist, Dr. Radene Knee, who performed a pelvic examination.  On physical examination and endocervical mass was appreciated and felt to be likely to be a polyp or fibroid was biopsied on 11/22/17.  This revealed poorly differentiated invasive carcinoma.  Immunostains revealed that it was CK7 positive, P 16+, p53 positive, PAX 8+ and CK 5 6 weakly positive.  CK 20, ER PR and p53 immunostains were negative.  The morphology and Immuno profile favored a gynecologic primary with endocervical and endometrial adenocarcinoma in the differential diagnosis.  She was ultimately at least found to have stage IV adenocarcinoma of the cervix with isolated metastasis within the L5 area.  Patient has recently completed her external beam and radiosensitizing chemotherapy. Overall she tolerated this treatment well. The patient presents for her first brachytherapy procedure, 5 planned.  she reports associated anxiety, and constipation. she denies chest pain, low back pain, incontinence, headaches, visual problems, cough, breathing problems, numbness or swelling in the lower extremities and any other symptoms.    PREVIOUS RADIATION THERAPY: No  PAST MEDICAL HISTORY:  has a past medical history of Cancer ([place]), Diabetes  ([place]), Hyperlipidemia, Hypertension, Other abnormal glucose, and PONV (postoperative nausea and vomiting).    PAST SURGICAL HISTORY: Past Surgical History:  Procedure Laterality Date  . ADENOIDECTOMY    . COLONOSCOPY  2013   said it was in [place]   . DILATION AND CURETTAGE OF UTERUS    . DILATION AND CURETTAGE OF UTERUS  03/11/2011   Procedure: DILATATION AND CURETTAGE;  Surgeon: Darlyn Chamber, MD;  Location: [place];  Service: Gynecology;  Laterality: N/A;  . HYSTEROSCOPY W/D&C  09-02-2006   ENDOMETRIAL BX'S  . HYSTEROSCOPY WITH RESECTOSCOPE  03/11/2011   Procedure: HYSTEROSCOPY WITH RESECTOSCOPE;  Surgeon: Darlyn Chamber, MD;  Location: [facility];  Service: Gynecology;  Laterality: N/A;  . IR FLUORO GUIDED NEEDLE PLC ASPIRATION/INJECTION LOC  12/21/2017  . IR IMAGING GUIDED PORT INSERTION  12/23/2017    FAMILY HISTORY: family history includes Cancer in her cousin and paternal aunt; Heart attack in her mother; Hyperlipidemia in an other family member; Hypertension in her father, mother, and another family member; Stroke in her brother.  SOCIAL HISTORY:  reports that she has quit smoking. She has never used smokeless tobacco. She reports that she does not drink alcohol or use drugs.  ALLERGIES: Clarithromycin; Dilacor [diltiazem hcl]; and Norvasc [amlodipine besylate]  MEDICATIONS:  No current facility-administered medications for this encounter.    Current Outpatient Medications  Medication Sig Dispense Refill  . amLODipine (NORVASC) 10 MG tablet Take 10 mg by mouth daily.     Marland Kitchen atenolol (TENORMIN) 50 MG tablet Take 50 mg by mouth daily.  1  . spironolactone (ALDACTONE) 25 MG tablet Take 1 tablet (25 mg total) by  mouth daily. 90 tablet 3  . Cholecalciferol (VITAMIN D3) 5000 units CAPS Take 5,000 Units by mouth daily.     Marland Kitchen dexamethasone (DECADRON) 4 MG tablet Take 2 tablets by mouth once a day on the day after chemotherapy and then take 2  tablets two times a day for 2 days. Take with food. 30 tablet 1  . fish oil-omega-3 fatty acids 1000 MG capsule Take 1 g by mouth daily.     . hydrocortisone (ANUSOL-HC) 2.5 % rectal cream Apply 1 application topically 2 (two) times daily. 28.35 g 0  . lidocaine-prilocaine (EMLA) cream Apply to affected area once 30 g 3  . LORazepam (ATIVAN) 0.5 MG tablet Take 1 tablet (0.5 mg total) by mouth every 6 (six) hours as needed (Nausea or vomiting). (Patient not taking: Reported on 01/11/2018) 30 tablet 0  . Multiple Vitamin (MULTIVITAMIN WITH MINERALS) TABS tablet Take 1 tablet by mouth daily.    . ondansetron (ZOFRAN) 8 MG tablet Take 1 tablet (8 mg total) by mouth 2 (two) times daily as needed. Start on the third day after chemotherapy. (Patient not taking: Reported on 01/11/2018) 30 tablet 1  . pramoxine (PROCTOFOAM) 1 % foam Place 1 application rectally 3 (three) times daily as needed for anal itching. 15 g 2  . prochlorperazine (COMPAZINE) 10 MG tablet Take 1 tablet (10 mg total) by mouth every 6 (six) hours as needed (Nausea or vomiting). (Patient not taking: Reported on 01/11/2018) 30 tablet 1    REVIEW OF SYSTEMS:  A 10+ POINT REVIEW OF SYSTEMS WAS OBTAINED including neurology, dermatology, psychiatry, cardiac, respiratory, lymph, extremities, GI, GU, musculoskeletal, constitutional, reproductive, HEENT. All pertinent positives are noted in the HPI. All others are negative.    PHYSICAL EXAM:  vitals were not taken for this visit.   General: Alert and oriented, in no acute distress HEENT: Head is normocephalic. Extraocular movements are intact. Oropharynx is clear. Neck: Neck is supple, no palpable cervical or supraclavicular lymphadenopathy. Heart: Regular in rate and rhythm with no murmurs, rubs, or gallops. Chest: Clear to auscultation bilaterally, with no rhonchi, wheezes, or rales. Abdomen: Soft, nontender, nondistended, with no rigidity or guarding. Extremities: No cyanosis or  edema. Lymphatics: see Neck Exam Skin: No concerning lesions. Musculoskeletal: symmetric strength and muscle tone throughout. Neurologic: Cranial nerves II through XII are grossly intact. No obvious focalities. Speech is fluent. Coordination is intact. Psychiatric: Judgment and insight are intact. Affect is appropriate. Pelvic examination to be performed under general anesthesia in the operating room      ECOG = 0  0 - Asymptomatic (Fully active, able to carry on all predisease activities without restriction)  1 - Symptomatic but completely ambulatory (Restricted in physically strenuous activity but ambulatory and able to carry out work of a light or sedentary nature. For example, light housework, office work)  2 - Symptomatic, <50% in bed during the day (Ambulatory and capable of all self care but unable to carry out any work activities. Up and about more than 50% of waking hours)  3 - Symptomatic, >50% in bed, but not bedbound (Capable of only limited self-care, confined to bed or chair 50% or more of waking hours)  4 - Bedbound (Completely disabled. Cannot carry on any self-care. Totally confined to bed or chair)  5 - Death   Eustace Pen MM, Creech RH, Tormey DC, et al. [phone]). "Toxicity and response criteria of the [facility]". [place] Oncol. 5 (6): 649-55  LABORATORY DATA:  Lab  Results  Component Value Date   WBC 3.2 (L) 02/20/2018   HGB 9.7 (L) 02/20/2018   HCT 28.8 (L) 02/20/2018   MCV 88.1 02/20/2018   PLT 185 02/20/2018   NEUTROABS 2.8 02/20/2018   Lab Results  Component Value Date   NA 133 (L) 02/20/2018   K 4.0 02/20/2018   CL 102 02/20/2018   CO2 25 02/20/2018   GLUCOSE 101 (H) 02/20/2018   CREATININE 0.73 02/20/2018   CALCIUM 8.6 (L) 02/20/2018      RADIOGRAPHY: No results found.    IMPRESSION:    Stage IV endocervical poorly differentiated adenocarcinoma. The patient was found to have a biopsy-proven isolated metastasis at L5.  Her case was presented at the multidisciplinary gynecologic oncology conference and recommendations were for aggressive definitive treatment. Her L5 metastasis has been included in her external beam pelvic radiation field.   PLAN:The patient  will be taken to the operating room on January 21 for her first of 5 planned brachytherapy procedures.    ------------------------------------------------  Jonne Rote D. Dulce Martian, PhD, MD      This document serves as a record of services personally performed by Tierrah Anastos, MD. It was created on his behalf by Mary-Margaret Crabb, a trained medical scribe. The creation of this record is based on the scribe's personal observations and the provider's statements to them. This document has been checked and approved by the attending provider.  

## 2018-02-26 NOTE — H&P (Signed)
Radiation Oncology         [phone] ________________________________  Initial Outpatient Consultation  Name: Toni Peterson MRN: [id number]  Date: 02/14/2018  DOB: 12/24/1950  WN:IOEVOJ, Toni Brow, MD  No ref. provider found   REFERRING PHYSICIAN: No ref. 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 female who is presented earlier to the office today for evaluation of  IB3 endocervical vs stage IV  poorly differentiated adenocarcinoma. She is accompanied by her cousin.    She developed symptoms of very light vaginal spotting in August 2019 and was seen by her gynecologist, Dr. Radene Knee, who performed a pelvic examination.  On physical examination and endocervical mass was appreciated and felt to be likely to be a polyp or fibroid was biopsied on 11/22/17.  This revealed poorly differentiated invasive carcinoma.  Immunostains revealed that it was CK7 positive, P 16+, p53 positive, PAX 8+ and CK 5 6 weakly positive.  CK 20, ER PR and p53 immunostains were negative.  The morphology and Immuno profile favored a gynecologic primary with endocervical and endometrial adenocarcinoma in the differential diagnosis.  She was ultimately at least found to have stage IV adenocarcinoma of the cervix with isolated metastasis within the L5 area.  Patient has recently completed her external beam and radiosensitizing chemotherapy. Overall she tolerated this treatment well. The patient presents for her first brachytherapy procedure, 5 planned.  she reports associated anxiety, and constipation. she denies chest pain, low back pain, incontinence, headaches, visual problems, cough, breathing problems, numbness or swelling in the lower extremities and any other symptoms.    PREVIOUS RADIATION THERAPY: No  PAST MEDICAL HISTORY:  has a past medical history of Cancer ([place]), Diabetes  ([place]), Hyperlipidemia, Hypertension, Other abnormal glucose, and PONV (postoperative nausea and vomiting).    PAST SURGICAL HISTORY: Past Surgical History:  Procedure Laterality Date  . ADENOIDECTOMY    . COLONOSCOPY  2013   said it was in [place]   . DILATION AND CURETTAGE OF UTERUS    . DILATION AND CURETTAGE OF UTERUS  03/11/2011   Procedure: DILATATION AND CURETTAGE;  Surgeon: Darlyn Chamber, MD;  Location: [place];  Service: Gynecology;  Laterality: N/A;  . HYSTEROSCOPY W/D&C  09-02-2006   ENDOMETRIAL BX'S  . HYSTEROSCOPY WITH RESECTOSCOPE  03/11/2011   Procedure: HYSTEROSCOPY WITH RESECTOSCOPE;  Surgeon: Darlyn Chamber, MD;  Location: [facility];  Service: Gynecology;  Laterality: N/A;  . IR FLUORO GUIDED NEEDLE PLC ASPIRATION/INJECTION LOC  12/21/2017  . IR IMAGING GUIDED PORT INSERTION  12/23/2017    FAMILY HISTORY: family history includes Cancer in her cousin and paternal aunt; Heart attack in her mother; Hyperlipidemia in an other family member; Hypertension in her father, mother, and another family member; Stroke in her brother.  SOCIAL HISTORY:  reports that she has quit smoking. She has never used smokeless tobacco. She reports that she does not drink alcohol or use drugs.  ALLERGIES: Clarithromycin; Dilacor [diltiazem hcl]; and Norvasc [amlodipine besylate]  MEDICATIONS:  No current facility-administered medications for this encounter.    Current Outpatient Medications  Medication Sig Dispense Refill  . amLODipine (NORVASC) 10 MG tablet Take 10 mg by mouth daily.     Marland Kitchen atenolol (TENORMIN) 50 MG tablet Take 50 mg by mouth daily.  1  . spironolactone (ALDACTONE) 25 MG tablet Take 1 tablet (25 mg total) by  mouth daily. 90 tablet 3  . Cholecalciferol (VITAMIN D3) 5000 units CAPS Take 5,000 Units by mouth daily.     Marland Kitchen dexamethasone (DECADRON) 4 MG tablet Take 2 tablets by mouth once a day on the day after chemotherapy and then take 2  tablets two times a day for 2 days. Take with food. 30 tablet 1  . fish oil-omega-3 fatty acids 1000 MG capsule Take 1 g by mouth daily.     . hydrocortisone (ANUSOL-HC) 2.5 % rectal cream Apply 1 application topically 2 (two) times daily. 28.35 g 0  . lidocaine-prilocaine (EMLA) cream Apply to affected area once 30 g 3  . LORazepam (ATIVAN) 0.5 MG tablet Take 1 tablet (0.5 mg total) by mouth every 6 (six) hours as needed (Nausea or vomiting). (Patient not taking: Reported on 01/11/2018) 30 tablet 0  . Multiple Vitamin (MULTIVITAMIN WITH MINERALS) TABS tablet Take 1 tablet by mouth daily.    . ondansetron (ZOFRAN) 8 MG tablet Take 1 tablet (8 mg total) by mouth 2 (two) times daily as needed. Start on the third day after chemotherapy. (Patient not taking: Reported on 01/11/2018) 30 tablet 1  . pramoxine (PROCTOFOAM) 1 % foam Place 1 application rectally 3 (three) times daily as needed for anal itching. 15 g 2  . prochlorperazine (COMPAZINE) 10 MG tablet Take 1 tablet (10 mg total) by mouth every 6 (six) hours as needed (Nausea or vomiting). (Patient not taking: Reported on 01/11/2018) 30 tablet 1    REVIEW OF SYSTEMS:  A 10+ POINT REVIEW OF SYSTEMS WAS OBTAINED including neurology, dermatology, psychiatry, cardiac, respiratory, lymph, extremities, GI, GU, musculoskeletal, constitutional, reproductive, HEENT. All pertinent positives are noted in the HPI. All others are negative.    PHYSICAL EXAM:  vitals were not taken for this visit.   General: Alert and oriented, in no acute distress HEENT: Head is normocephalic. Extraocular movements are intact. Oropharynx is clear. Neck: Neck is supple, no palpable cervical or supraclavicular lymphadenopathy. Heart: Regular in rate and rhythm with no murmurs, rubs, or gallops. Chest: Clear to auscultation bilaterally, with no rhonchi, wheezes, or rales. Abdomen: Soft, nontender, nondistended, with no rigidity or guarding. Extremities: No cyanosis or  edema. Lymphatics: see Neck Exam Skin: No concerning lesions. Musculoskeletal: symmetric strength and muscle tone throughout. Neurologic: Cranial nerves II through XII are grossly intact. No obvious focalities. Speech is fluent. Coordination is intact. Psychiatric: Judgment and insight are intact. Affect is appropriate. Pelvic examination to be performed under general anesthesia in the operating room      ECOG = 0  0 - Asymptomatic (Fully active, able to carry on all predisease activities without restriction)  1 - Symptomatic but completely ambulatory (Restricted in physically strenuous activity but ambulatory and able to carry out work of a light or sedentary nature. For example, light housework, office work)  2 - Symptomatic, <50% in bed during the day (Ambulatory and capable of all self care but unable to carry out any work activities. Up and about more than 50% of waking hours)  3 - Symptomatic, >50% in bed, but not bedbound (Capable of only limited self-care, confined to bed or chair 50% or more of waking hours)  4 - Bedbound (Completely disabled. Cannot carry on any self-care. Totally confined to bed or chair)  5 - Death   Eustace Pen MM, Creech RH, Tormey DC, et al. [phone]). "Toxicity and response criteria of the [facility]". [place] Oncol. 5 (6): 649-55  LABORATORY DATA:  Lab  Results  Component Value Date   WBC 3.2 (L) 02/20/2018   HGB 9.7 (L) 02/20/2018   HCT 28.8 (L) 02/20/2018   MCV 88.1 02/20/2018   PLT 185 02/20/2018   NEUTROABS 2.8 02/20/2018   Lab Results  Component Value Date   NA 133 (L) 02/20/2018   K 4.0 02/20/2018   CL 102 02/20/2018   CO2 25 02/20/2018   GLUCOSE 101 (H) 02/20/2018   CREATININE 0.73 02/20/2018   CALCIUM 8.6 (L) 02/20/2018      RADIOGRAPHY: No results found.    IMPRESSION:    Stage IV endocervical poorly differentiated adenocarcinoma. The patient was found to have a biopsy-proven isolated metastasis at L5.  Her case was presented at the multidisciplinary gynecologic oncology conference and recommendations were for aggressive definitive treatment. Her L5 metastasis has been included in her external beam pelvic radiation field.   PLAN:The patient  will be taken to the operating room on January 21 for her first of 5 planned brachytherapy procedures.    ------------------------------------------------  Tonesha Tsou D. Janelys Glassner, PhD, MD      This document serves as a record of services personally performed by Brynn Reznik, MD. It was created on his behalf by Mary-Margaret Crabb, a trained medical scribe. The creation of this record is based on the scribe's personal observations and the provider's statements to them. This document has been checked and approved by the attending provider.  

## 2018-02-27 ENCOUNTER — Other Ambulatory Visit (HOSPITAL_COMMUNITY): Payer: Self-pay | Admitting: Radiation Oncology

## 2018-02-27 DIAGNOSIS — C539 Malignant neoplasm of cervix uteri, unspecified: Secondary | ICD-10-CM

## 2018-02-27 NOTE — Anesthesia Preprocedure Evaluation (Addendum)
Anesthesia Evaluation  Patient identified by MRN, date of birth, ID band Patient awake    Reviewed: Allergy & Precautions, H&P , NPO status , Patient's Chart, lab work & pertinent test results, reviewed documented beta blocker date and time   History of Anesthesia Complications (+) PONV and history of anesthetic complications  Airway Mallampati: II  TM Distance: <3 FB Neck ROM: full    Dental no notable dental hx. (+) Teeth Intact, Dental Advisory Given, Missing,    Pulmonary neg pulmonary ROS, former smoker,    Pulmonary exam normal breath sounds clear to auscultation       Cardiovascular Exercise Tolerance: Good hypertension, On Home Beta Blockers and Pt. on medications Normal cardiovascular exam Rhythm:regular Rate:Normal  Junctional rhythm on ECG.  ECHO 11/19 - Normal LV EF with normal strain and normal/vigorous function by   3D estimate. Grade 2 diastolic dysfunction. Trivial valvular   disease. Lipomatous hypertrophy of the septum.   Neuro/Psych negative neurological ROS  negative psych ROS   GI/Hepatic negative GI ROS, Neg liver ROS,   Endo/Other  negative endocrine ROSdiabetes  Renal/GU negative Renal ROS  negative genitourinary   Musculoskeletal negative musculoskeletal ROS (+)   Abdominal   Peds negative pediatric ROS (+)  Hematology  (+) Blood dyscrasia, anemia ,   Anesthesia Other Findings   Reproductive/Obstetrics negative OB ROS                           Anesthesia Physical  Anesthesia Plan  ASA: III  Anesthesia Plan: General   Post-op Pain Management:    Induction: Intravenous  PONV Risk Score and Plan:   Airway Management Planned: LMA  Additional Equipment:   Intra-op Plan:   Post-operative Plan:   Informed Consent: I have reviewed the patients History and Physical, chart, labs and discussed the procedure including the risks, benefits and alternatives  for the proposed anesthesia with the patient or authorized representative who has indicated his/her understanding and acceptance.     Dental Advisory Given  Plan Discussed with: CRNA and Surgeon  Anesthesia Plan Comments:         Anesthesia Quick Evaluation

## 2018-02-28 ENCOUNTER — Other Ambulatory Visit: Payer: Self-pay

## 2018-02-28 ENCOUNTER — Ambulatory Visit (HOSPITAL_BASED_OUTPATIENT_CLINIC_OR_DEPARTMENT_OTHER): Payer: BC Managed Care – PPO | Admitting: Anesthesiology

## 2018-02-28 ENCOUNTER — Ambulatory Visit
Admission: RE | Admit: 2018-02-28 | Discharge: 2018-02-28 | Disposition: A | Discharge status: Home / Self Care | Payer: BC Managed Care – PPO | Source: Ambulatory Visit | Attending: Radiation Oncology | Admitting: Radiation Oncology

## 2018-02-28 ENCOUNTER — Ambulatory Visit (HOSPITAL_COMMUNITY)
Admission: RE | Admit: 2018-02-28 | Discharge: 2018-02-28 | Disposition: A | Discharge status: Home / Self Care | Payer: BC Managed Care – PPO | Source: Ambulatory Visit | Attending: Radiation Oncology | Admitting: Radiation Oncology

## 2018-02-28 ENCOUNTER — Encounter: Payer: Self-pay | Admitting: Oncology

## 2018-02-28 ENCOUNTER — Ambulatory Visit (HOSPITAL_BASED_OUTPATIENT_CLINIC_OR_DEPARTMENT_OTHER)
Admission: RE | Admit: 2018-02-28 | Discharge: 2018-02-28 | Disposition: A | Discharge status: Still In Hospital | Payer: BC Managed Care – PPO | Attending: Radiation Oncology | Admitting: Radiation Oncology

## 2018-02-28 ENCOUNTER — Encounter (HOSPITAL_BASED_OUTPATIENT_CLINIC_OR_DEPARTMENT_OTHER): Payer: Self-pay | Admitting: *Deleted

## 2018-02-28 ENCOUNTER — Encounter (HOSPITAL_BASED_OUTPATIENT_CLINIC_OR_DEPARTMENT_OTHER)
Admission: RE | Disposition: A | Discharge status: Still In Hospital | Payer: Self-pay | Source: Home / Self Care | Attending: Radiation Oncology

## 2018-02-28 ENCOUNTER — Ambulatory Visit: Payer: BC Managed Care – PPO | Admitting: Radiation Oncology

## 2018-02-28 DIAGNOSIS — E119 Type 2 diabetes mellitus without complications: Secondary | ICD-10-CM | POA: Diagnosis not present

## 2018-02-28 DIAGNOSIS — C530 Malignant neoplasm of endocervix: Secondary | ICD-10-CM

## 2018-02-28 DIAGNOSIS — I10 Essential (primary) hypertension: Secondary | ICD-10-CM | POA: Insufficient documentation

## 2018-02-28 DIAGNOSIS — C539 Malignant neoplasm of cervix uteri, unspecified: Secondary | ICD-10-CM

## 2018-02-28 DIAGNOSIS — K5900 Constipation, unspecified: Secondary | ICD-10-CM | POA: Diagnosis not present

## 2018-02-28 DIAGNOSIS — E785 Hyperlipidemia, unspecified: Secondary | ICD-10-CM | POA: Diagnosis not present

## 2018-02-28 DIAGNOSIS — Z79899 Other long term (current) drug therapy: Secondary | ICD-10-CM | POA: Insufficient documentation

## 2018-02-28 DIAGNOSIS — Z9889 Other specified postprocedural states: Secondary | ICD-10-CM

## 2018-02-28 DIAGNOSIS — Z87891 Personal history of nicotine dependence: Secondary | ICD-10-CM | POA: Diagnosis not present

## 2018-02-28 DIAGNOSIS — R112 Nausea with vomiting, unspecified: Secondary | ICD-10-CM

## 2018-02-28 HISTORY — DX: Other specified postprocedural states: Z98.890

## 2018-02-28 HISTORY — PX: TANDEM RING INSERTION: SHX6199

## 2018-02-28 HISTORY — DX: Nausea with vomiting, unspecified: R11.2

## 2018-02-28 LAB — POCT I-STAT, CHEM 8
BUN: 16 mg/dL (ref 8–23)
CREATININE: 0.7 mg/dL (ref 0.44–1.00)
Calcium, Ion: 1.18 mmol/L (ref 1.15–1.40)
Chloride: 98 mmol/L (ref 98–111)
Glucose, Bld: 111 mg/dL — ABNORMAL HIGH (ref 70–99)
HCT: 30 % — ABNORMAL LOW (ref 36.0–46.0)
Hemoglobin: 10.2 g/dL — ABNORMAL LOW (ref 12.0–15.0)
Potassium: 3.7 mmol/L (ref 3.5–5.1)
Sodium: 133 mmol/L — ABNORMAL LOW (ref 135–145)
TCO2: 25 mmol/L (ref 22–32)

## 2018-02-28 SURGERY — INSERTION, UTERINE TANDEM AND RING OR CYLINDER, FOR BRACHYTHERAPY
Anesthesia: General | Site: Cervix

## 2018-02-28 MED ORDER — ACETAMINOPHEN 325 MG PO TABS
325.0000 mg | ORAL_TABLET | ORAL | Status: DC | PRN
  Filled 2018-02-28: qty 2

## 2018-02-28 MED ORDER — ESTRADIOL 0.1 MG/GM VA CREA
TOPICAL_CREAM | VAGINAL | Status: AC
  Filled 2018-02-28: qty 42.5

## 2018-02-28 MED ORDER — DEXAMETHASONE SODIUM PHOSPHATE 10 MG/ML IJ SOLN
INTRAMUSCULAR | Status: DC | PRN
  Administered 2018-02-28: 10 mg via INTRAVENOUS

## 2018-02-28 MED ORDER — MIDAZOLAM HCL 2 MG/2ML IJ SOLN
INTRAMUSCULAR | Status: AC
  Filled 2018-02-28: qty 2

## 2018-02-28 MED ORDER — LIDOCAINE 2% (20 MG/ML) 5 ML SYRINGE
INTRAMUSCULAR | Status: DC | PRN
  Administered 2018-02-28: 60 mg via INTRAVENOUS

## 2018-02-28 MED ORDER — KETOROLAC TROMETHAMINE 30 MG/ML IJ SOLN
INTRAMUSCULAR | Status: AC
  Filled 2018-02-28: qty 1

## 2018-02-28 MED ORDER — ONDANSETRON HCL 4 MG PO TABS
8.0000 mg | ORAL_TABLET | Freq: Once | ORAL | Status: AC
  Administered 2018-02-28: 8 mg via ORAL
  Filled 2018-02-28: qty 2

## 2018-02-28 MED ORDER — FENTANYL CITRATE (PF) 100 MCG/2ML IJ SOLN
25.0000 ug | INTRAMUSCULAR | Status: DC | PRN
  Administered 2018-02-28: 50 ug via INTRAVENOUS
  Filled 2018-02-28: qty 1

## 2018-02-28 MED ORDER — LACTATED RINGERS IV SOLN
INTRAVENOUS | Status: DC
  Administered 2018-02-28: 06:00:00 via INTRAVENOUS
  Filled 2018-02-28: qty 1000

## 2018-02-28 MED ORDER — SODIUM CHLORIDE 0.9 % IR SOLN
Status: DC | PRN
  Administered 2018-02-28: 1000 mL

## 2018-02-28 MED ORDER — PROPOFOL 500 MG/50ML IV EMUL
INTRAVENOUS | Status: DC | PRN
  Administered 2018-02-28: 200 ug/kg/min via INTRAVENOUS

## 2018-02-28 MED ORDER — FENTANYL CITRATE (PF) 100 MCG/2ML IJ SOLN
INTRAMUSCULAR | Status: DC | PRN
  Administered 2018-02-28: 25 ug via INTRAVENOUS
  Administered 2018-02-28: 50 ug via INTRAVENOUS
  Administered 2018-02-28: 25 ug via INTRAVENOUS

## 2018-02-28 MED ORDER — OXYCODONE HCL 5 MG PO TABS
5.0000 mg | ORAL_TABLET | Freq: Once | ORAL | Status: DC | PRN
  Filled 2018-02-28: qty 1

## 2018-02-28 MED ORDER — PROPOFOL 10 MG/ML IV BOLUS
INTRAVENOUS | Status: AC
  Filled 2018-02-28: qty 40

## 2018-02-28 MED ORDER — OXYCODONE HCL 5 MG/5ML PO SOLN
5.0000 mg | Freq: Once | ORAL | Status: DC | PRN
  Filled 2018-02-28: qty 5

## 2018-02-28 MED ORDER — LACTATED RINGERS IV SOLN
Freq: Once | INTRAVENOUS | Status: AC
  Administered 2018-02-28: 10:00:00 via INTRAVENOUS
  Filled 2018-02-28: qty 250

## 2018-02-28 MED ORDER — FENTANYL CITRATE (PF) 100 MCG/2ML IJ SOLN
INTRAMUSCULAR | Status: AC
  Filled 2018-02-28: qty 2

## 2018-02-28 MED ORDER — MIDAZOLAM HCL 2 MG/2ML IJ SOLN
INTRAMUSCULAR | Status: DC | PRN
  Administered 2018-02-28: 2 mg via INTRAVENOUS

## 2018-02-28 MED ORDER — MEPERIDINE HCL 25 MG/ML IJ SOLN
6.2500 mg | INTRAMUSCULAR | Status: DC | PRN
  Filled 2018-02-28: qty 1

## 2018-02-28 MED ORDER — HYDROMORPHONE HCL 1 MG/ML IJ SOLN
0.5000 mg | Freq: Once | INTRAMUSCULAR | Status: AC
  Administered 2018-02-28: 0.5 mg via INTRAVENOUS
  Filled 2018-02-28: qty 1

## 2018-02-28 MED ORDER — ONDANSETRON HCL 4 MG/2ML IJ SOLN
INTRAMUSCULAR | Status: AC
  Filled 2018-02-28: qty 2

## 2018-02-28 MED ORDER — ONDANSETRON HCL 4 MG/2ML IJ SOLN
4.0000 mg | Freq: Once | INTRAMUSCULAR | Status: DC | PRN
  Filled 2018-02-28: qty 2

## 2018-02-28 MED ORDER — ACETAMINOPHEN 160 MG/5ML PO SOLN
325.0000 mg | ORAL | Status: DC | PRN
  Filled 2018-02-28: qty 20.3

## 2018-02-28 MED ORDER — STERILE WATER FOR IRRIGATION IR SOLN
Status: DC | PRN
  Administered 2018-02-28: 500 mL

## 2018-02-28 MED ORDER — DEXAMETHASONE SODIUM PHOSPHATE 10 MG/ML IJ SOLN
INTRAMUSCULAR | Status: AC
  Filled 2018-02-28: qty 1

## 2018-02-28 MED ORDER — PROPOFOL 10 MG/ML IV BOLUS
INTRAVENOUS | Status: DC | PRN
  Administered 2018-02-28: 150 mg via INTRAVENOUS

## 2018-02-28 MED ORDER — LIDOCAINE 2% (20 MG/ML) 5 ML SYRINGE
INTRAMUSCULAR | Status: AC
  Filled 2018-02-28: qty 5

## 2018-02-28 MED ORDER — ARTIFICIAL TEARS OPHTHALMIC OINT
TOPICAL_OINTMENT | OPHTHALMIC | Status: AC
  Filled 2018-02-28: qty 3.5

## 2018-02-28 SURGICAL SUPPLY — 21 items
BAG URINE DRAINAGE (UROLOGICAL SUPPLIES) ×3 IMPLANT
BNDG CONFORM 2 STRL LF (GAUZE/BANDAGES/DRESSINGS) IMPLANT
CATH FOLEY 2WAY SLVR  5CC 16FR (CATHETERS) ×2
CATH FOLEY 2WAY SLVR 5CC 16FR (CATHETERS) ×1 IMPLANT
COVER WAND RF STERILE (DRAPES) ×6 IMPLANT
DILATOR CANAL MILEX (MISCELLANEOUS) IMPLANT
GAUZE 4X4 16PLY RFD (DISPOSABLE) ×3 IMPLANT
GLOVE BIO SURGEON STRL SZ7.5 (GLOVE) ×6 IMPLANT
GOWN STRL REUS W/TWL LRG LVL3 (GOWN DISPOSABLE) ×3 IMPLANT
HOLDER FOLEY CATH W/STRAP (MISCELLANEOUS) ×3 IMPLANT
HOVERMATT SINGLE USE (MISCELLANEOUS) ×3 IMPLANT
IV NS 1000ML (IV SOLUTION) ×3
IV NS 1000ML BAXH (IV SOLUTION) ×1 IMPLANT
KIT TURNOVER CYSTO (KITS) ×3 IMPLANT
PACK VAGINAL MINOR WOMEN LF (CUSTOM PROCEDURE TRAY) ×3 IMPLANT
PACKING VAGINAL (PACKING) IMPLANT
PAD ABD 8X10 STRL (GAUZE/BANDAGES/DRESSINGS) ×3 IMPLANT
PAD OB MATERNITY 4.3X12.25 (PERSONAL CARE ITEMS) IMPLANT
SET IRRIG Y TYPE TUR BLADDER L (SET/KITS/TRAYS/PACK) ×3 IMPLANT
TOWEL OR 17X24 6PK STRL BLUE (TOWEL DISPOSABLE) ×6 IMPLANT
WATER STERILE IRR 500ML POUR (IV SOLUTION) ×3 IMPLANT

## 2018-02-28 NOTE — Anesthesia Procedure Notes (Signed)
Procedure Name: LMA Insertion Date/Time: 02/28/2018 7:35 AM Performed by: Wanita Chamberlain, CRNA Pre-anesthesia Checklist: Patient identified, Emergency Drugs available, Suction available and Patient being monitored Patient Re-evaluated:Patient Re-evaluated prior to induction Oxygen Delivery Method: Circle system utilized Preoxygenation: Pre-oxygenation with 100% oxygen Induction Type: IV induction Ventilation: Mask ventilation without difficulty LMA: LMA inserted LMA Size: 4.0 Number of attempts: 1 Airway Equipment and Method: Bite block (gauze between upper and lower central incisor) Placement Confirmation: breath sounds checked- equal and bilateral,  CO2 detector and positive ETCO2 Tube secured with: Tape Dental Injury: Teeth and Oropharynx as per pre-operative assessment

## 2018-02-28 NOTE — Patient Instructions (Signed)
IMMEDIATELY FOLLOWING SURGERY: Do not drive or operate machinery for the first twenty four hours after surgery. Do not make any important decisions for twenty four hours after surgery or while taking narcotic pain medications or sedatives. If you develop intractable nausea and vomiting or a severe headache please notify your doctor immediately.   FOLLOW-UP: You do not need to follow up with anesthesia unless specifically instructed to do so.   WOUND CARE INSTRUCTIONS (if applicable): Expect some mild vaginal bleeding, but if large amount of bleeding occurs please contact Dr. Sondra Come at [phone] or the Radiation On-Call physician. Call for any fever greater than 101.0 degrees or increasing vaginal//abdominal pain or trouble urinating.   QUESTIONS?: Please feel free to call your physician or the hospital operator if you have any questions, and they will be happy to assist you. Resume all medications: as listed on your after visit summary. Your next appointment is:  Future Appointments  Date Time Provider [place]  03/06/2018  2:00 PM Tana Felts [facility] None  03/06/2018  3:00 PM [facility] LAB 1 [facility] None  03/07/2018  7:30 AM [facility] 2 [facility] [place]  03/07/2018  9:30 AM Gery Pray, MD [facility] None  03/07/2018  1:00 PM Gery Pray, MD [facility] None  03/16/2018 11:00 AM Gery Pray, MD [facility] None  03/16/2018  2:00 PM Gery Pray, MD [facility] None  03/17/2018 10:15 AM [facility] LAB 6 [facility] None  03/17/2018 10:30 AM [facility] None  03/20/2018  1:00 PM Gery Pray, MD [facility] None  03/20/2018  3:00 PM Gery Pray, MD [facility] None  03/27/2018  9:00 AM Gery Pray, MD [facility] None  03/27/2018  1:00 PM Gery Pray, MD [facility] None

## 2018-02-28 NOTE — Addendum Note (Signed)
Encounter addended by: Loma Sousa, RN on: 02/28/2018 10:13 AM  Actions taken: MAR administration accepted

## 2018-02-28 NOTE — Progress Notes (Signed)
  Radiation Oncology         [phone] ________________________________  Name: Toni Peterson MRN: [id number]  Date: 02/28/2018  DOB: 1950/08/02  CC: Antony Contras, MD  Everitt Amber, MD  HDR BRACHYTHERAPY NOTE  DIAGNOSIS: cervical cancer  NARRATIVE: The patient was brought to the [place] suite. Identity was confirmed. All relevant records and images related to the planned course of therapy were reviewed. The patient freely provided informed written consent to proceed with treatment after reviewing the details related to the planned course of therapy. The consent form was witnessed and verified by the simulation staff. Then, the patient was set-up in a stable reproducible supine position for radiation therapy. The tandem ring system was accessed and fiducial markers were placed within the tandem and ring.   Simple treatment device note: On the operating room the patient had construction of her custom tandem ring system. She will be treated with a 45 tandem/ring system. The patient had placement of a 60 mm tandem. A cervical ring with a large shielding was used for her treatment. A rectal paddle was also part of her custom set up device.  Verification simulation note: An AP and lateral film was obtained through the pelvis area. This was compared to the patient's planning films documenting accurate position of the tandem/ring system for treatment.  High-dose-rate brachytherapy treatment note:  The remote afterloading device was accessed through catheter system and attached to the tandem ring system. Patient then proceeded to undergo her first high-dose-rate treatment directed at the cervix. The patient was prescribed a dose of 5.5 gray to be delivered to the high-risk clinical target volume.. Patient was treated with 2 channels using 25 dwell positions. Treatment time was 392.0 seconds. The patient tolerated the procedure well. After completion of her therapy, a radiation survey was performed  documenting return of the iridium source into the GammaMed safe. The patient was then transferred to the nursing suite. She then had removal of the rectal paddle followed by the tandem and ring system. The patient tolerated the removal well.  PLAN: patient will return next week for her second high-dose-rate treatment. ________________________________  Blair Promise, PhD, MD

## 2018-02-28 NOTE — Addendum Note (Signed)
Encounter addended by: Loma Sousa, RN on: 02/28/2018 1:51 PM  Actions taken: MAR administration accepted

## 2018-02-28 NOTE — Progress Notes (Signed)
3391: Received from [facility] PACU. Pt awake and aware of surroundings. Transported pt to [place]. Loma Sousa, RN BSN   1020: Received pt from [place]. Pt reports moderate pain. Breakfast tray ordered. Pt taking PO without difficulty. IVF infusing without difficulty via PIV. Foley catheter patent, draining clear yellow urine. Loma Sousa, RN BSN   [phone]: spoke with pt's daughter in law. Daughter in law to pick pt up. This RN removed foley without difficulty. Pt tolerated well. PIV removed with catheter tip intact. Dr. Sondra Come removed Tandem and Ring device without difficulty. Pt tolerated fair. Pt became diaphoretic and very nauseous once sitting up. Pt was assisted with dressing. Pt was given an AVS with discharge instructions and number to call for any post-procedure complaints. Pt was discharged via WC. Loma Sousa, RN BSN

## 2018-02-28 NOTE — Interval H&P Note (Signed)
History and Physical Interval Note:  02/28/2018 7:25 AM  Toni Peterson  has presented today for surgery, with the diagnosis of CERVICAL CANCER  The various methods of treatment have been discussed with the patient and family. After consideration of risks, benefits and other options for treatment, the patient has consented to  Procedure(s): TANDEM RING INSERTION (N/A) as a surgical intervention .  The patient's history has been reviewed, patient examined, no change in status, stable for surgery.  I have reviewed the patient's chart and labs.  Questions were answered to the patient's satisfaction.     Gery Pray

## 2018-02-28 NOTE — Addendum Note (Signed)
Encounter addended by: Loma Sousa, RN on: 02/28/2018 10:50 AM  Actions taken: Order list changed, Diagnosis association updated

## 2018-02-28 NOTE — Transfer of Care (Signed)
Immediate Anesthesia Transfer of Care Note  Patient: Toni Peterson  Procedure(s) Performed: TANDEM RING INSERTION (N/A Cervix)  Patient Location: PACU  Anesthesia Type:General  Level of Consciousness: awake, alert , oriented and patient cooperative  Airway & Oxygen Therapy: Patient Spontanous Breathing and Patient connected to nasal cannula oxygen  Post-op Assessment: Report given to RN and Post -op Vital signs reviewed and stable  Post vital signs: Reviewed and stable  Last Vitals:  Vitals Value Taken Time  BP    Temp    Pulse    Resp    SpO2      Last Pain:  Vitals:   02/28/18 0613  TempSrc:   PainSc: 0-No pain      Patients Stated Pain Goal: 5 (73/53/29 9242)  Complications: No apparent anesthesia complications

## 2018-02-28 NOTE — Addendum Note (Signed)
Encounter addended by: Loma Sousa, RN on: 02/28/2018 3:46 PM  Actions taken: Clinical Note Signed

## 2018-02-28 NOTE — Progress Notes (Signed)
  Radiation Oncology         [phone] ________________________________  Name: Toni Peterson MRN: [id number]  Date: 02/28/2018  DOB: 1951-02-01  SIMULATION AND TREATMENT PLANNING NOTE HDR BRACHYTHERAPY  DIAGNOSIS:  Cervical cancer  NARRATIVE:  The patient was brought to the [place] suite.  Identity was confirmed.  All relevant records and images related to the planned course of therapy were reviewed.  The patient freely provided informed written consent to proceed with treatment after reviewing the details related to the planned course of therapy. The consent form was witnessed and verified by the simulation staff.  Then, the patient was set-up in a stable reproducible  supine position for radiation therapy.  CT images were obtained.  Surface markings were placed.  The CT images were loaded into the planning software.  Then the target and avoidance structures were contoured.  Treatment planning then occurred.  The radiation prescription was entered and confirmed.   I have requested : Brachytherapy Isodose Plan and Dosimetry Calculations to plan the radiation distribution.    PLAN:  The patient will receive 5.5 Gy in 1 fraction. The patient will be treated with iridium 192 as the high-dose-rate source. The tandem ring system will be used to deliver the treatment.    ________________________________  Blair Promise, PhD, MD

## 2018-02-28 NOTE — Addendum Note (Signed)
Encounter addended by: Loma Sousa, RN on: 02/28/2018 1:50 PM  Actions taken: Order list changed, Diagnosis association updated

## 2018-02-28 NOTE — Anesthesia Postprocedure Evaluation (Signed)
Anesthesia Post Note  Patient: CHELSAE ZANELLA  Procedure(s) Performed: TANDEM RING INSERTION (N/A Cervix)     Patient location during evaluation: PACU Anesthesia Type: General Level of consciousness: awake and alert Pain management: pain level controlled Vital Signs Assessment: post-procedure vital signs reviewed and stable Respiratory status: spontaneous breathing, nonlabored ventilation, respiratory function stable and patient connected to nasal cannula oxygen Cardiovascular status: blood pressure returned to baseline and stable Postop Assessment: no apparent nausea or vomiting Anesthetic complications: no    Last Vitals:  Vitals:   02/28/18 0830 02/28/18 0900  BP: (!) 161/83 (!) 148/63  Pulse: 62 61  Resp: 14 17  Temp:    SpO2: 100% 95%    Last Pain:  Vitals:   02/28/18 0840  TempSrc:   PainSc: 0-No pain                 Remon Quinto

## 2018-02-28 NOTE — Addendum Note (Signed)
Encounter addended by: Loma Sousa, RN on: 02/28/2018 9:43 AM  Actions taken: Order list changed, Diagnosis association updated

## 2018-02-28 NOTE — Addendum Note (Signed)
Encounter addended by: Loma Sousa, RN on: 02/28/2018 2:33 PM  Actions taken: Order list changed, Diagnosis association updated

## 2018-02-28 NOTE — Addendum Note (Signed)
Encounter addended by: Loma Sousa, RN on: 02/28/2018 10:56 AM  Actions taken: MAR administration accepted

## 2018-02-28 NOTE — Addendum Note (Signed)
Encounter addended by: Loma Sousa, RN on: 02/28/2018 2:36 PM  Actions taken: MAR administration accepted

## 2018-02-28 NOTE — Addendum Note (Signed)
Encounter addended by: Loma Sousa, RN on: 02/28/2018 3:25 PM  Actions taken: Clinical Note Signed

## 2018-02-28 NOTE — Op Note (Signed)
02/28/2018  8:39 AM  PATIENT:  Toni Peterson  68 y.o. female  PRE-OPERATIVE DIAGNOSIS:  CERVICAL CANCER  POST-OPERATIVE DIAGNOSIS:  CERVICAL CANCER  PROCEDURE:  Procedure(s): TANDEM RING INSERTION (N/A)  SURGEON:  Surgeon(s) and Role:    * Gery Pray, MD - Primary  PHYSICIAN ASSISTANT:   ASSISTANTS: none   ANESTHESIA:   general  EBL:  15 cc  BLOOD ADMINISTERED:none  DRAINS: Urinary Catheter (Foley)   LOCAL MEDICATIONS USED:  NONE  SPECIMEN:  No Specimen  DISPOSITION OF SPECIMEN:  N/A  COUNTS:  YES  TOURNIQUET:  * No tourniquets in log *  DICTATION: The patient was transported to the outpatient surgical center room #3. Patient was prepped and draped in the usual sterile fashion placed in the dorsal lithotomy position.A timeout for the procedure, estimated length of the procedure and preoperative medications with performed. Patient had a Foley catheter placed with ~ 250 mL of saline backfilled into the bladder for ultrasound imaging purposes. Exam under anesthesia revealed the cervical mass to be significantly decreased in size but quite firm along the cervical area estimated to be approximately 3 x 3 cm in size. No parametrial extension. Patient proceeded to undergo dilation of the cervical os and sounding of the uterus. The uterus was noted to be expanded and on ultrasound this appeared to be multiple fibroids. Once the cervical os was dilated serosanguineous fluid drained from the uterus. On ultrasound the uterus sounded to approximately 8.5 cm. Patient proceeded to undergo dilation of the cervical os and then placement of a 60 mm cervical sleeve. Good ultrasound imaging was obtained. The uterus was noted to be in the anteverted position. Patient then had placement of a 60 mm 45 tandem within the cervical sleeve in the endocervical canal and midportion of the uterus. Patient then had a 45ring with large shielding Placed along the cervical region. This was then followed  by placement of a rectal paddle posteriorly. Patient tolerated the procedure well. Later in the day the patient will be transported to the radiation oncology Department for planning and her first high-dose-rate treatment. The patient is planned to have a total of 5 high-dose rate treatments directed at the cervical region. propofol was used in light of the patient's history of postoperative nausea.  PLAN OF CARE: transferred to radiation oncology for planning and treatment  PATIENT DISPOSITION:  PACU - hemodynamically stable.   Delay start of Pharmacological VTE agent (>24hrs) due to surgical blood loss or risk of bleeding: not applicable

## 2018-03-01 ENCOUNTER — Encounter (HOSPITAL_BASED_OUTPATIENT_CLINIC_OR_DEPARTMENT_OTHER): Payer: Self-pay | Admitting: Radiation Oncology

## 2018-03-01 ENCOUNTER — Other Ambulatory Visit (HOSPITAL_COMMUNITY): Payer: Self-pay | Admitting: Radiation Oncology

## 2018-03-01 DIAGNOSIS — C539 Malignant neoplasm of cervix uteri, unspecified: Secondary | ICD-10-CM

## 2018-03-02 ENCOUNTER — Telehealth: Payer: Self-pay

## 2018-03-02 NOTE — Telephone Encounter (Signed)
Contacted pt to check on how she is doing after Tandem and Ring procedure. Pt is overwhelmed with billing calls re: OOP expenses for procedures. Pt reports that she is having scant vaginal bleeding. Pt asking how she can prevent PONV for next procedure. Encouraged pt to ask surgical team, especially anesthesia providers, for a scopolamine patch. Pt verbalized understanding and agreement. Toni Sousa, RN BSN

## 2018-03-06 ENCOUNTER — Telehealth: Payer: Self-pay | Admitting: Oncology

## 2018-03-06 ENCOUNTER — Encounter (HOSPITAL_BASED_OUTPATIENT_CLINIC_OR_DEPARTMENT_OTHER): Payer: Self-pay | Admitting: *Deleted

## 2018-03-06 ENCOUNTER — Inpatient Hospital Stay (HOSPITAL_BASED_OUTPATIENT_CLINIC_OR_DEPARTMENT_OTHER): Payer: BC Managed Care – PPO | Admitting: Genetics

## 2018-03-06 ENCOUNTER — Inpatient Hospital Stay: Payer: BC Managed Care – PPO

## 2018-03-06 ENCOUNTER — Other Ambulatory Visit: Payer: Self-pay

## 2018-03-06 DIAGNOSIS — C539 Malignant neoplasm of cervix uteri, unspecified: Secondary | ICD-10-CM

## 2018-03-06 DIAGNOSIS — R310 Gross hematuria: Secondary | ICD-10-CM

## 2018-03-06 DIAGNOSIS — Z7183 Encounter for nonprocreative genetic counseling: Secondary | ICD-10-CM

## 2018-03-06 DIAGNOSIS — C183 Malignant neoplasm of hepatic flexure: Secondary | ICD-10-CM | POA: Diagnosis not present

## 2018-03-06 DIAGNOSIS — C7951 Secondary malignant neoplasm of bone: Secondary | ICD-10-CM

## 2018-03-06 DIAGNOSIS — Z803 Family history of malignant neoplasm of breast: Secondary | ICD-10-CM | POA: Diagnosis not present

## 2018-03-06 DIAGNOSIS — Z5111 Encounter for antineoplastic chemotherapy: Secondary | ICD-10-CM | POA: Diagnosis not present

## 2018-03-06 DIAGNOSIS — C530 Malignant neoplasm of endocervix: Secondary | ICD-10-CM

## 2018-03-06 DIAGNOSIS — K639 Disease of intestine, unspecified: Secondary | ICD-10-CM | POA: Diagnosis not present

## 2018-03-06 LAB — URINALYSIS, COMPLETE (UACMP) WITH MICROSCOPIC
BILIRUBIN URINE: NEGATIVE
Bacteria, UA: NONE SEEN
Glucose, UA: NEGATIVE mg/dL
KETONES UR: NEGATIVE mg/dL
Nitrite: NEGATIVE
Protein, ur: 100 mg/dL — AB
Specific Gravity, Urine: 1.021 (ref 1.005–1.030)
pH: 6 (ref 5.0–8.0)

## 2018-03-06 MED ORDER — HEPARIN SOD (PORK) LOCK FLUSH 100 UNIT/ML IV SOLN
500.0000 [IU] | Freq: Once | INTRAVENOUS | Status: AC
  Administered 2018-03-06: 500 [IU]
  Filled 2018-03-06: qty 5

## 2018-03-06 MED ORDER — SODIUM CHLORIDE 0.9% FLUSH
10.0000 mL | Freq: Once | INTRAVENOUS | Status: AC
  Administered 2018-03-06: 10 mL
  Filled 2018-03-06: qty 10

## 2018-03-06 NOTE — Telephone Encounter (Signed)
Toni Peterson called and asked when her next tandem treatment will be.  Advised her that the next appointment will be tomorrow, 03/07/18.  She verbalized agreement.

## 2018-03-06 NOTE — Patient Instructions (Signed)

## 2018-03-06 NOTE — Progress Notes (Signed)
Spoke with Rainy Npo after midnight, arrive 530 am 03-07-18 [facility] meds to take sip of water: prochloperazine and zofran Will arrange driver for 3-40-35 procedure  has surgery orders in epic Records on chart/epic:ekg 11-18-17 epic, lov dr hochrein  11-19-27

## 2018-03-07 ENCOUNTER — Other Ambulatory Visit: Payer: Self-pay

## 2018-03-07 ENCOUNTER — Ambulatory Visit (HOSPITAL_COMMUNITY)
Admission: RE | Admit: 2018-03-07 | Discharge: 2018-03-07 | Disposition: A | Discharge status: Home / Self Care | Payer: BC Managed Care – PPO | Source: Ambulatory Visit | Attending: Radiation Oncology | Admitting: Radiation Oncology

## 2018-03-07 ENCOUNTER — Ambulatory Visit
Admission: RE | Admit: 2018-03-07 | Discharge: 2018-03-07 | Disposition: A | Discharge status: Home / Self Care | Payer: BC Managed Care – PPO | Source: Ambulatory Visit | Attending: Radiation Oncology | Admitting: Radiation Oncology

## 2018-03-07 ENCOUNTER — Encounter (HOSPITAL_BASED_OUTPATIENT_CLINIC_OR_DEPARTMENT_OTHER)
Admission: RE | Disposition: A | Discharge status: Home / Self Care | Payer: Self-pay | Source: Home / Self Care | Attending: Radiation Oncology

## 2018-03-07 ENCOUNTER — Ambulatory Visit: Payer: BC Managed Care – PPO | Admitting: Radiation Oncology

## 2018-03-07 ENCOUNTER — Encounter (HOSPITAL_BASED_OUTPATIENT_CLINIC_OR_DEPARTMENT_OTHER): Payer: Self-pay | Admitting: *Deleted

## 2018-03-07 ENCOUNTER — Ambulatory Visit (HOSPITAL_BASED_OUTPATIENT_CLINIC_OR_DEPARTMENT_OTHER): Payer: BC Managed Care – PPO | Admitting: Anesthesiology

## 2018-03-07 ENCOUNTER — Ambulatory Visit (HOSPITAL_BASED_OUTPATIENT_CLINIC_OR_DEPARTMENT_OTHER)
Admission: RE | Admit: 2018-03-07 | Discharge: 2018-03-07 | Disposition: A | Discharge status: Home / Self Care | Payer: BC Managed Care – PPO | Attending: Radiation Oncology | Admitting: Radiation Oncology

## 2018-03-07 ENCOUNTER — Encounter: Payer: Self-pay | Admitting: Genetics

## 2018-03-07 VITALS — BP 132/78 | HR 70 | Temp 99.0°F | Resp 20

## 2018-03-07 DIAGNOSIS — Z6833 Body mass index (BMI) 33.0-33.9, adult: Secondary | ICD-10-CM | POA: Diagnosis not present

## 2018-03-07 DIAGNOSIS — Z79899 Other long term (current) drug therapy: Secondary | ICD-10-CM | POA: Diagnosis not present

## 2018-03-07 DIAGNOSIS — E119 Type 2 diabetes mellitus without complications: Secondary | ICD-10-CM | POA: Diagnosis not present

## 2018-03-07 DIAGNOSIS — Z803 Family history of malignant neoplasm of breast: Secondary | ICD-10-CM | POA: Insufficient documentation

## 2018-03-07 DIAGNOSIS — I10 Essential (primary) hypertension: Secondary | ICD-10-CM | POA: Diagnosis not present

## 2018-03-07 DIAGNOSIS — E785 Hyperlipidemia, unspecified: Secondary | ICD-10-CM | POA: Diagnosis not present

## 2018-03-07 DIAGNOSIS — Z87891 Personal history of nicotine dependence: Secondary | ICD-10-CM | POA: Diagnosis not present

## 2018-03-07 DIAGNOSIS — Z881 Allergy status to other antibiotic agents status: Secondary | ICD-10-CM | POA: Insufficient documentation

## 2018-03-07 DIAGNOSIS — C539 Malignant neoplasm of cervix uteri, unspecified: Secondary | ICD-10-CM | POA: Diagnosis present

## 2018-03-07 DIAGNOSIS — F419 Anxiety disorder, unspecified: Secondary | ICD-10-CM | POA: Diagnosis not present

## 2018-03-07 DIAGNOSIS — C530 Malignant neoplasm of endocervix: Secondary | ICD-10-CM

## 2018-03-07 DIAGNOSIS — Z888 Allergy status to other drugs, medicaments and biological substances status: Secondary | ICD-10-CM | POA: Diagnosis not present

## 2018-03-07 DIAGNOSIS — C7951 Secondary malignant neoplasm of bone: Secondary | ICD-10-CM | POA: Diagnosis not present

## 2018-03-07 DIAGNOSIS — Z809 Family history of malignant neoplasm, unspecified: Secondary | ICD-10-CM | POA: Insufficient documentation

## 2018-03-07 HISTORY — PX: TANDEM RING INSERTION: SHX6199

## 2018-03-07 LAB — URINE CULTURE

## 2018-03-07 LAB — GLUCOSE, CAPILLARY: Glucose-Capillary: 112 mg/dL — ABNORMAL HIGH (ref 70–99)

## 2018-03-07 SURGERY — INSERTION, UTERINE TANDEM AND RING OR CYLINDER, FOR BRACHYTHERAPY
Anesthesia: General | Site: Vagina

## 2018-03-07 MED ORDER — KETOROLAC TROMETHAMINE 30 MG/ML IJ SOLN
INTRAMUSCULAR | Status: DC | PRN
  Administered 2018-03-07: 30 mg via INTRAVENOUS

## 2018-03-07 MED ORDER — MIDAZOLAM HCL 5 MG/5ML IJ SOLN
INTRAMUSCULAR | Status: DC | PRN
  Administered 2018-03-07: 2 mg via INTRAVENOUS

## 2018-03-07 MED ORDER — FENTANYL CITRATE (PF) 100 MCG/2ML IJ SOLN
25.0000 ug | INTRAMUSCULAR | Status: DC | PRN
  Filled 2018-03-07: qty 1

## 2018-03-07 MED ORDER — MIDAZOLAM HCL 2 MG/2ML IJ SOLN
0.5000 mg | Freq: Once | INTRAMUSCULAR | Status: DC | PRN
  Filled 2018-03-07: qty 2

## 2018-03-07 MED ORDER — SCOPOLAMINE 1 MG/3DAYS TD PT72
MEDICATED_PATCH | TRANSDERMAL | Status: AC
  Filled 2018-03-07: qty 1

## 2018-03-07 MED ORDER — SODIUM CHLORIDE 0.9 % IR SOLN
Status: DC | PRN
  Administered 2018-03-07: 1000 mL via INTRAVESICAL

## 2018-03-07 MED ORDER — MIDAZOLAM HCL 2 MG/2ML IJ SOLN
INTRAMUSCULAR | Status: AC
  Filled 2018-03-07: qty 2

## 2018-03-07 MED ORDER — PROPOFOL 10 MG/ML IV BOLUS
INTRAVENOUS | Status: DC | PRN
  Administered 2018-03-07: 150 mg via INTRAVENOUS
  Administered 2018-03-07: 20 mg via INTRAVENOUS

## 2018-03-07 MED ORDER — PROMETHAZINE HCL 25 MG/ML IJ SOLN
6.2500 mg | INTRAMUSCULAR | Status: DC | PRN
  Filled 2018-03-07: qty 1

## 2018-03-07 MED ORDER — ONDANSETRON HCL 4 MG/2ML IJ SOLN
INTRAMUSCULAR | Status: AC
  Filled 2018-03-07: qty 2

## 2018-03-07 MED ORDER — SCOPOLAMINE 1 MG/3DAYS TD PT72
1.0000 | MEDICATED_PATCH | Freq: Once | TRANSDERMAL | Status: DC
  Administered 2018-03-07: 1.5 mg via TRANSDERMAL
  Filled 2018-03-07: qty 1

## 2018-03-07 MED ORDER — KETOROLAC TROMETHAMINE 30 MG/ML IJ SOLN
INTRAMUSCULAR | Status: AC
  Filled 2018-03-07: qty 1

## 2018-03-07 MED ORDER — MEPERIDINE HCL 25 MG/ML IJ SOLN
6.2500 mg | INTRAMUSCULAR | Status: DC | PRN
  Filled 2018-03-07: qty 1

## 2018-03-07 MED ORDER — ONDANSETRON HCL 4 MG/2ML IJ SOLN
INTRAMUSCULAR | Status: DC | PRN
  Administered 2018-03-07: 4 mg via INTRAVENOUS

## 2018-03-07 MED ORDER — LACTATED RINGERS IV SOLN
INTRAVENOUS | Status: DC
  Administered 2018-03-07: 06:00:00 via INTRAVENOUS
  Filled 2018-03-07 (×2): qty 1000

## 2018-03-07 MED ORDER — ACETAMINOPHEN 10 MG/ML IV SOLN
INTRAVENOUS | Status: DC | PRN
  Administered 2018-03-07: 1000 mg via INTRAVENOUS

## 2018-03-07 MED ORDER — LIDOCAINE 2% (20 MG/ML) 5 ML SYRINGE
INTRAMUSCULAR | Status: DC | PRN
  Administered 2018-03-07: 60 mg via INTRAVENOUS

## 2018-03-07 MED ORDER — ARTIFICIAL TEARS OPHTHALMIC OINT
TOPICAL_OINTMENT | OPHTHALMIC | Status: AC
  Filled 2018-03-07: qty 3.5

## 2018-03-07 MED ORDER — PROPOFOL 500 MG/50ML IV EMUL
INTRAVENOUS | Status: AC
  Filled 2018-03-07: qty 50

## 2018-03-07 MED ORDER — LACTATED RINGERS IV SOLN
INTRAVENOUS | Status: DC
  Administered 2018-03-07: 10:00:00 via INTRAVENOUS
  Filled 2018-03-07 (×2): qty 250

## 2018-03-07 MED ORDER — PHENAZOPYRIDINE HCL 200 MG PO TABS: 200.0000 mg | ORAL_TABLET | Freq: Three times a day (TID) | ORAL | 0 refills | Status: DC | PRN

## 2018-03-07 MED ORDER — ACETAMINOPHEN 10 MG/ML IV SOLN
INTRAVENOUS | Status: AC
  Filled 2018-03-07: qty 100

## 2018-03-07 MED ORDER — PROPOFOL 10 MG/ML IV BOLUS
INTRAVENOUS | Status: AC
  Filled 2018-03-07: qty 20

## 2018-03-07 MED ORDER — FENTANYL CITRATE (PF) 100 MCG/2ML IJ SOLN
INTRAMUSCULAR | Status: DC | PRN
  Administered 2018-03-07 (×2): 50 ug via INTRAVENOUS

## 2018-03-07 MED ORDER — DEXAMETHASONE SODIUM PHOSPHATE 10 MG/ML IJ SOLN
INTRAMUSCULAR | Status: AC
  Filled 2018-03-07: qty 1

## 2018-03-07 MED ORDER — LIDOCAINE 2% (20 MG/ML) 5 ML SYRINGE
INTRAMUSCULAR | Status: AC
  Filled 2018-03-07: qty 5

## 2018-03-07 MED ORDER — DEXAMETHASONE SODIUM PHOSPHATE 4 MG/ML IJ SOLN
INTRAMUSCULAR | Status: DC | PRN
  Administered 2018-03-07: 8 mg via INTRAVENOUS

## 2018-03-07 MED ORDER — FENTANYL CITRATE (PF) 100 MCG/2ML IJ SOLN
INTRAMUSCULAR | Status: AC
  Filled 2018-03-07: qty 2

## 2018-03-07 SURGICAL SUPPLY — 27 items
BAG URINE DRAINAGE (UROLOGICAL SUPPLIES) ×3 IMPLANT
BNDG CONFORM 2 STRL LF (GAUZE/BANDAGES/DRESSINGS) IMPLANT
CATH FOLEY 2WAY SLVR  5CC 16FR (CATHETERS)
CATH FOLEY 2WAY SLVR 5CC 16FR (CATHETERS) ×1 IMPLANT
COVER WAND RF STERILE (DRAPES) ×4 IMPLANT
DILATOR CANAL MILEX (MISCELLANEOUS) IMPLANT
GAUZE 4X4 16PLY RFD (DISPOSABLE) ×3 IMPLANT
GLOVE BIO SURGEON STRL SZ 6.5 (GLOVE) ×2 IMPLANT
GLOVE BIO SURGEON STRL SZ7.5 (GLOVE) ×6 IMPLANT
GLOVE BIO SURGEONS STRL SZ 6.5 (GLOVE) ×2
GLOVE BIOGEL PI IND STRL 6.5 (GLOVE) IMPLANT
GLOVE BIOGEL PI INDICATOR 6.5 (GLOVE) ×4
GOWN STRL REUS W/TWL LRG LVL3 (GOWN DISPOSABLE) ×7 IMPLANT
HOLDER FOLEY CATH W/STRAP (MISCELLANEOUS) ×3 IMPLANT
HOVERMATT SINGLE USE (MISCELLANEOUS) ×3 IMPLANT
IV NS 1000ML (IV SOLUTION) ×3
IV NS 1000ML BAXH (IV SOLUTION) ×1 IMPLANT
KIT TURNOVER CYSTO (KITS) ×3 IMPLANT
PACK VAGINAL MINOR WOMEN LF (CUSTOM PROCEDURE TRAY) ×3 IMPLANT
PACKING VAGINAL (PACKING) IMPLANT
PAD ABD 8X10 STRL (GAUZE/BANDAGES/DRESSINGS) ×3 IMPLANT
PAD OB MATERNITY 4.3X12.25 (PERSONAL CARE ITEMS) IMPLANT
SET IRRIG Y TYPE TUR BLADDER L (SET/KITS/TRAYS/PACK) ×1 IMPLANT
TOWEL OR 17X24 6PK STRL BLUE (TOWEL DISPOSABLE) ×6 IMPLANT
TRAY FOL W/BAG SLVR 16FR STRL (SET/KITS/TRAYS/PACK) IMPLANT
TRAY FOLEY W/BAG SLVR 16FR LF (SET/KITS/TRAYS/PACK) ×3
WATER STERILE IRR 500ML POUR (IV SOLUTION) ×3 IMPLANT

## 2018-03-07 NOTE — Progress Notes (Signed)
  Radiation Oncology         [phone] ________________________________  Name: Toni Peterson MRN: [id number]  Date: 03/07/2018  DOB: 10/28/1950  SIMULATION AND TREATMENT PLANNING NOTE HDR BRACHYTHERAPY  DIAGNOSIS:  Cervical cancer  NARRATIVE:  The patient was brought to the [place] suite.  Identity was confirmed.  All relevant records and images related to the planned course of therapy were reviewed.  The patient freely provided informed written consent to proceed with treatment after reviewing the details related to the planned course of therapy. The consent form was witnessed and verified by the simulation staff.  Then, the patient was set-up in a stable reproducible  supine position for radiation therapy.  CT images were obtained.  Surface markings were placed.  The CT images were loaded into the planning software.  Then the target and avoidance structures were contoured.  Treatment planning then occurred.  The radiation prescription was entered and confirmed.   I have requested : Brachytherapy Isodose Plan and Dosimetry Calculations to plan the radiation distribution.    PLAN:  The patient will receive 5.5 Gy in 1 fraction.  The patient will be treated with a tandem ring system. Iridium 192 will be the high-dose-rate source.    ________________________________  Blair Promise, PhD, MD

## 2018-03-07 NOTE — Op Note (Signed)
03/07/2018  8:48 AM  PATIENT:  Toni Peterson  68 y.o. female  PRE-OPERATIVE DIAGNOSIS:  CERVICAL CANCER  POST-OPERATIVE DIAGNOSIS:  CERVICAL CANCER  PROCEDURE:  Procedure(s): TANDEM RING INSERTION (N/A)  SURGEON:  Surgeon(s) and Role:    * Gery Pray, MD - Primary  PHYSICIAN ASSISTANT:   ASSISTANTS: none   ANESTHESIA:   general  EBL:  0 mL   BLOOD ADMINISTERED:none  DRAINS: Urinary Catheter (Foley)   LOCAL MEDICATIONS USED:  NONE  SPECIMEN:  No Specimen  DISPOSITION OF SPECIMEN:  N/A  COUNTS:  YES  TOURNIQUET:  * No tourniquets in log *  DICTATION: The patient was transported to the outpatient surgical center room #3. Patient was prepped and draped in the usual sterile fashion placed in the dorsal lithotomy position.A timeout for the procedure, estimated length of the procedure and preoperative medications with performed. Patient had a Foley catheter placed with ~ 250 mL of saline backfilled into the bladder for ultrasound imaging purposes. Exam under anesthesia revealed the cervical mass to be significantly decreased in size but quite firm along the cervical area estimated to be approximately 2.5 x 3 cm in size. No parametrial extension. Patient proceeded to undergo dilation of the cervical os and sounding of the uterus. The uterus was noted to be expanded and on ultrasound this appeared to be multiple fibroids. Once the cervical os was dilated serosanguineous fluid drained from the uterus. On ultrasound the uterus sounded to approximately 8.5 cm. Patient proceeded to undergo dilation of the cervical os and then placement of a 60 mm cervical sleeve. Good ultrasound imaging was obtained. The uterus was noted to be in the anteverted position. Patient then had placement of a 60 mm 45 tandem within the cervical sleeve in the endocervical canal and midportion of the uterus. Patient then had a 45ring with small shielding cap placed along the cervical region. This was then  followed by placement of a rectal paddle posteriorly. Patient tolerated the procedure well. Later in the day the patient will be transported to the radiation oncology Department for planning and her first high-dose-rate treatment. The patient is planned to have a total of 5 high-dose rate treatments directed at the cervical region.  PLAN OF CARE: transferred to radiation oncology for planning and treatment  PATIENT DISPOSITION:  PACU - hemodynamically stable.   Delay start of Pharmacological VTE agent (>24hrs) due to surgical blood loss or risk of bleeding: not applicable

## 2018-03-07 NOTE — Interval H&P Note (Signed)
History and Physical Interval Note:  03/07/2018 7:31 AM  Toni Peterson  has presented today for surgery, with the diagnosis of CERVICAL CANCER  The various methods of treatment have been discussed with the patient and family. After consideration of risks, benefits and other options for treatment, the patient has consented to  Procedure(s): TANDEM RING INSERTION (N/A) as a surgical intervention .  The patient's history has been reviewed, patient examined, no change in status, stable for surgery.  I have reviewed the patient's chart and labs.  Questions were answered to the patient's satisfaction.     Gery Pray

## 2018-03-07 NOTE — Discharge Instructions (Signed)

## 2018-03-07 NOTE — Transfer of Care (Signed)
  Last Vitals:  Vitals Value Taken Time  BP 140/82 03/07/2018  8:23 AM  Temp 36.8 C 03/07/2018  8:23 AM  Pulse 72 03/07/2018  8:25 AM  Resp 13 03/07/2018  8:25 AM  SpO2 100 % 03/07/2018  8:25 AM  Vitals shown include unvalidated device data.  Last Pain:  Vitals:   03/07/18 0554  TempSrc:   PainSc: 5       Patients Stated Pain Goal: 7 (03/07/18 0554) Immediate Anesthesia Transfer of Care Note  Patient: Toni Peterson  Procedure(s) Performed: Procedure(s) (LRB): TANDEM RING INSERTION (N/A)  Patient Location: PACU  Anesthesia Type: General  Level of Consciousness: awake, alert  and oriented  Airway & Oxygen Therapy: Patient Spontanous Breathing and Patient connected to nasal cannula oxygen  Post-op Assessment: Report given to PACU RN and Post -op Vital signs reviewed and stable  Post vital signs: Reviewed and stable  Complications: No apparent anesthesia complications

## 2018-03-07 NOTE — Anesthesia Procedure Notes (Signed)
Procedure Name: LMA Insertion Date/Time: 03/07/2018 7:40 AM Performed by: Annye Asa, MD Pre-anesthesia Checklist: Patient identified, Emergency Drugs available, Suction available and Patient being monitored Patient Re-evaluated:Patient Re-evaluated prior to induction Oxygen Delivery Method: Circle system utilized Preoxygenation: Pre-oxygenation with 100% oxygen Induction Type: IV induction Ventilation: Mask ventilation without difficulty LMA: LMA inserted LMA Size: 4.0 Number of attempts: 1 Airway Equipment and Method: Bite block Placement Confirmation: positive ETCO2 Tube secured with: Tape Dental Injury: Teeth and Oropharynx as per pre-operative assessment

## 2018-03-07 NOTE — Anesthesia Preprocedure Evaluation (Addendum)
Anesthesia Evaluation  Patient identified by MRN, date of birth, ID band Patient awake    Reviewed: Allergy & Precautions, NPO status , Patient's Chart, lab work & pertinent test results, reviewed documented beta blocker date and time   History of Anesthesia Complications (+) PONV  Airway Mallampati: I  TM Distance: >3 FB Neck ROM: Full    Dental  (+) Dental Advisory Given, Missing   Pulmonary former smoker,    breath sounds clear to auscultation       Cardiovascular hypertension, Pt. on medications and Pt. on home beta blockers (-) angina Rhythm:Regular Rate:Normal  11/19 ECHO: EF 60-65%, valves ok   Neuro/Psych negative neurological ROS     GI/Hepatic negative GI ROS, Neg liver ROS,   Endo/Other  diabetes (diet controlled)Morbid obesity  Renal/GU negative Renal ROS     Musculoskeletal   Abdominal (+) + obese,   Peds  Hematology negative hematology ROS (+)   Anesthesia Other Findings   Reproductive/Obstetrics                            Anesthesia Physical Anesthesia Plan  ASA: II  Anesthesia Plan: General   Post-op Pain Management:    Induction: Intravenous  PONV Risk Score and Plan: 4 or greater and Scopolamine patch - Pre-op, Dexamethasone and Ondansetron  Airway Management Planned: LMA  Additional Equipment:   Intra-op Plan:   Post-operative Plan:   Informed Consent: I have reviewed the patients History and Physical, chart, labs and discussed the procedure including the risks, benefits and alternatives for the proposed anesthesia with the patient or authorized representative who has indicated his/her understanding and acceptance.     Dental advisory given  Plan Discussed with: CRNA and Surgeon  Anesthesia Plan Comments: (Plan routine monitors, GETA)        Anesthesia Quick Evaluation

## 2018-03-07 NOTE — Progress Notes (Signed)
  Radiation Oncology         (336) [phone] ________________________________  Name: Toni Peterson MRN: [id number]  Date: 03/07/2018  DOB: [birth year]  CC: Antony Contras, MD  Everitt Amber, MD  HDR BRACHYTHERAPY NOTE  DIAGNOSIS: cervical cancer  NARRATIVE: The patient was brought to the [place] suite. Identity was confirmed. All relevant records and images related to the planned course of therapy were reviewed. The patient freely provided informed written consent to proceed with treatment after reviewing the details related to the planned course of therapy. The consent form was witnessed and verified by the simulation staff. Then, the patient was set-up in a stable reproducible supine position for radiation therapy. The tandem ring system was accessed and fiducial markers were placed within the tandem and ring.   Simple treatment device note: On the operating room the patient had construction of her custom tandem ring system. She will be treated with a 45 tandem/ring system. The patient had placement of a 60 mm tandem. A cervical ring with a small shielding was used for her treatment. A rectal paddle was also part of her custom set up device.  Verification simulation note: An AP and lateral film was obtained through the pelvis area. This was compared to the patient's planning films documenting accurate position of the tandem/ring system for treatment.  High-dose-rate brachytherapy treatment note:  The remote afterloading device was accessed through catheter system and attached to the tandem ring system. Patient then proceeded to undergo her second high-dose-rate treatment directed at the cervix. The patient was prescribed a dose of 5.5 gray to be delivered to the high-risk clinical target volume.. Patient was treated with 2 channels using 28 dwell positions. Treatment time was 466.2 seconds. The patient tolerated the procedure well. After completion of her therapy, a radiation survey was performed  documenting return of the iridium source into the GammaMed safe. The patient was then transferred to the nursing suite. She then had removal of the rectal paddle followed by the tandem and ring system. The patient tolerated the removal well.  PLAN: she will return next week for her third high-dose-rate treatment. ________________________________  Blair Promise, PhD, MD

## 2018-03-07 NOTE — Addendum Note (Signed)
Encounter addended by: Gery Pray, MD on: 03/07/2018 7:14 PM  Actions taken: Order list changed

## 2018-03-07 NOTE — Progress Notes (Signed)
REFERRING PROVIDER: Heath Lark, MD [address] [place], [place] [address]  PRIMARY PROVIDER:  Antony Contras, MD  PRIMARY REASON FOR VISIT:  1. Malignant neoplasm of endocervix ([place])   2. Family history of breast cancer   3. Malignant neoplasm of hepatic flexure ([facility])   4. Malignant neoplasm of cervix, unspecified site ([place])   5. Lesion of colon     HISTORY OF PRESENT ILLNESS:   Toni Peterson, a 68 y.o. female, was seen for a [facility] cancer genetics consultation at the request of Dr. Alvy Bimler due to a personal and family history of cancer.  [name] presents to clinic today to discuss the possibility of a hereditary predisposition to cancer, genetic testing, and to further clarify her future cancer risks, as well as potential cancer risks for family members.   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  She has been undergoing chemotherapy.  In December 2019, she was dx with an early stage colon cancer.   IHC testing was performed in initial biopsy and revealed loss of MLH1 and PMS2.  Given her stage IV metastatic cervical cancer, the current plan is to delay colon surgery at this time and monitor closely.   CANCER HISTORY:  Oncology History   MSI - Stable on cervix biopsy PD-L1 2%  Bone biopsy is consistent with metastatic GYN primary MMR protein MLH1 and PMS2 abnormalities noted on colon biopsy     Cervical cancer ([place])   09/08/2017 Initial Diagnosis    Patient has a history of symptomatic endometrial fibroids in 2013 for which she underwent hysteroscopic resection with benign pathology.  She has had lifelong normal Pap smears including in 2019.  Of note HPV testing was not performed in this Pap in 2019.  She developed symptoms of very light vaginal spotting in August 2019     11/22/2017 Procedure    She was seen by her gynecologist, Dr. Radene Knee, who performed a pelvic examination.  On physical examination and endocervical mass was  appreciated and felt to be likely to be a polyp or fibroid was biopsied on 11/22/17.       11/22/2017 Pathology Results    This revealed poorly differentiated invasive carcinoma.  Immunostains revealed that it was CK7 positive, P 16+, p53 positive, PAX 8+ and CK 5 6 weakly positive.  CK 20, ER PR and p53 immunostains were negative.  The morphology and Immuno profile favored a gynecologic primary with endocervical and endometrial adenocarcinoma in the differential diagnosis.     11/22/2017 Imaging    An ultrasound scan on November 22, 2017 revealed several intramural fibroids.  With saline infusion there was a 1.8 cm density within the endometrial cavity which could be a fibroid or polyp.     12/08/2017 Imaging    PET:  1. Marked hypermetabolism involving the cervix and uterus. Difficult to be certain of the origin but favor cervix. No evidence of parametrial or parauterine tumor and no pelvic or retroperitoneal hypermetabolic lymphadenopathy. 2. L5 vertebral body lesion worrisome for metastasis. 3. Hypermetabolic area involving the hepatic flexure region of the colon is worrisome for colon cancer. Recommend colonoscopy. 4. Benign left adrenal gland adenoma. 5. Symmetric hypermetabolism in the tongue base and tonsillar regions is likely inflamed lymphoid tissue. No neck mass or adenopathy.    12/19/2017 Imaging    1. Diffuse marrow replacement in the L5 vertebral body, indeterminate though metastatic disease is a concern. Correlate with upcoming biopsy. No extraosseous tumor  or pathologic fracture. 2. Marrow signal abnormality on both sides of the T11-12 disc space favored to be degenerative. 3. Severe L4-5 facet arthrosis with grade 1 anterolisthesis, mild bilateral lateral recess stenosis, and mild-to-moderate bilateral neural foraminal stenosis. Findings may worsen with standing. 4. Moderate bilateral neural foraminal stenosis at L5-S1. 5. Partially visualized uterine masses which may  reflect a combination of known adenocarcinoma and fibroids. 4 cm right adnexal mass may reflect an exophytic subserosal fibroid or ovarian neoplasm.    12/20/2017 Echocardiogram    LV EF: 60% -   65%    12/23/2017 Procedure    Successful placement of a right IJ approach Power Port with ultrasound and fluoroscopic guidance. The catheter is ready for use.    12/23/2017 Pathology Results    Bone, biopsy, L5 - METASTATIC POORLY DIFFERENTIATED CARCINOMA. - SEE COMMENT. Microscopic Comment Dr. Vic Ripper has reviewed the case and concurs with this interpretation. The case was discussed wtih Joylene John on 12/22/2017. Per request, a block will be sent for PDL1 testing and the results reported separately    01/11/2018 Cancer Staging    Staging form: Cervix Uteri, AJCC 8th Edition - Clinical: FIGO Stage IVB (cT2, cN0, pM1) - Signed by Heath Lark, MD on 01/11/2018    01/17/2018 -  Chemotherapy    The patient had weekly cisplatin     Colon cancer ([facility])   01/11/2018 Procedure    - Hemorrhoids found on perianal exam. - Malignant partially obstructing tumor at the hepatic flexure. Biopsied. Tattooed. - Two 2 to 5 mm polyps in the sigmoid colon, removed with a cold snare. Resected and retrieved. - Diverticulosis in the sigmoid colon, in the distal descending colon and in the ascending colon. - Non-bleeding internal hemorrhoids. - The examined portion of the ileum was normal.    01/11/2018 Pathology Results    1. Colon, biopsy, hepatic flexure - ADENOCARCINOMA, SEE COMMENT. 2. Colon, polyp(s), sigmoid, polyp (2) - HYPERPLASTIC POLYP (X2 FRAGMENTS). - NO DYSPLASIA OR MALIGNANCY. Microscopic Comment 1. There are additional fragments of tubular adenoma suggesting this is a primary colon adenocarcinoma.     02/07/2018 Cancer Staging    Staging form: Colon and Rectum, AJCC 8th Edition - Clinical: Stage I (cT1, cN0, cM0) - Signed by Heath Lark, MD on 02/07/2018      HORMONAL RISK FACTORS:   Menarche was at age 84.  First live birth at age 65.  Ovaries intact: yes.  Hysterectomy: no.  Menopausal status: postmenopausal.  Mammogram within the last year: yes. Number of breast biopsies: 0. Up to date with pelvic exams:  yes.  Past Medical History:  Diagnosis Date  . Cancer ([facility])    Cervical  . Colon cancer ([place])   . Diabetes ([facility] [place])    no meds-diet control  . Family history of breast cancer   . Hyperlipidemia   . Hypertension   . Other abnormal glucose   . PONV (postoperative nausea and vomiting)    slow awakening after dental visit, pain after tandem removal 02-28-18  . PONV (postoperative nausea and vomiting) 02/28/2018   pt told would get scopolamine patch for next tandem 03-07-18 for nausea/vomiting    Past Surgical History:  Procedure Laterality Date  . ADENOIDECTOMY    . COLONOSCOPY  2013   said it was in [place]   . DILATION AND CURETTAGE OF UTERUS    . DILATION AND CURETTAGE OF UTERUS  03/11/2011   Procedure: DILATATION AND CURETTAGE;  Surgeon: Darlyn Chamber, MD;  Location: [facility];  Service: Gynecology;  Laterality: N/A;  . HYSTEROSCOPY W/D&C  09-02-2006   ENDOMETRIAL BX'S  . HYSTEROSCOPY WITH RESECTOSCOPE  03/11/2011   Procedure: HYSTEROSCOPY WITH RESECTOSCOPE;  Surgeon: Darlyn Chamber, MD;  Location: [facility];  Service: Gynecology;  Laterality: N/A;  . IR FLUORO GUIDED NEEDLE PLC ASPIRATION/INJECTION LOC  12/21/2017  . IR IMAGING GUIDED PORT INSERTION  12/23/2017  . TANDEM RING INSERTION N/A 02/28/2018   Procedure: TANDEM RING INSERTION;  Surgeon: Gery Pray, MD;  [facility];  Service: Urology;  Laterality: N/A;    Social History   Socioeconomic History  . Marital status: Single    Spouse name: Not on file  . Number of children: 1  . Years of education: Not on file  . Highest education level: Not on file  Occupational History  . Not on file  Social Needs  . Financial resource  strain: Not on file  . Food insecurity:    Worry: Not on file    Inability: Not on file  . Transportation needs:    Medical: Not on file    Non-medical: Not on file  Tobacco Use  . Smoking status: Former Research scientist (life sciences)  . Smokeless tobacco: Never Used  . Tobacco comment: quit 20 years ago  Substance and Sexual Activity  . Alcohol use: No  . Drug use: No  . Sexual activity: Not on file  Lifestyle  . Physical activity:    Days per week: Not on file    Minutes per session: Not on file  . Stress: Not on file  Relationships  . Social connections:    Talks on phone: Not on file    Gets together: Not on file    Attends religious service: Not on file    Active member of club or organization: Not on file    Attends meetings of clubs or organizations: Not on file    Relationship status: Not on file  Other Topics Concern  . Not on file  Social History Narrative   Former smoker, Quit in year quit 2003. Tobacco history last updated 11/052014.no smoking.no Alcohol , no recreational  Drugs   Nothing structured.Occupation : Building services engineer Status Single   Children 1   Religion ; Methodist   sdeat belt use  yes           FAMILY HISTORY:  We obtained a detailed, 4-generation family history.  Significant diagnoses are listed below: Family History  Problem Relation Age of Onset  . Stroke Brother        Died 44  . Heart attack Mother        Vague  . Hypertension Mother   . Hyperlipidemia Other   . Hypertension Other   . Hypertension Father   . Breast cancer Paternal Aunt 3  . Breast cancer Cousin 29  . Colon cancer Neg Hx   . Esophageal cancer Neg Hx   . Rectal cancer Neg Hx   . Stomach cancer Neg Hx     Ms. Haddaway has a 2 year-old son with no hx of cancer.  She has 2 grandchildren.   Ms. Call ahs a brother who died at 4 due to a stroke.    Ms. Bosch father: died in his 11's with no hx fo cancer.  Paternal Aunts/Uncles: 5 paternal aunts, 2 paternal uncles. 1  paternal aunt was dx with breast cancer at aoub age 27.  Paternal cousins:  1 paternal cousin was dx with breast cancer at age 57.  Paternal grandfather: no hx of cancer.  Paternal grandmother:no hx of cancer.   Ms. Rickel mother: died in her 53's, heart attack.  She had a full hysterectomy in her 61's.  Maternal Aunts/Uncles: 2 maternal aunts with no hx of cancer.  Maternal cousins: no hx of cancer.  Maternal grandfather: died in  His 61's with no hx of cancer.  Maternal grandmother:no hx of cancer.   Ms. Waller is unaware of previous family history of genetic testing for hereditary cancer risks. Patient's maternal ancestors are of African American/Black descent, and paternal ancestors are of African American/Black descent. There is no reported Ashkenazi Jewish ancestry. There is no known consanguinity.  GENETIC COUNSELING ASSESSMENT: MAILIN COGLIANESE is a 68 y.o. female with a personal history which is somewhat suggestive of a Hereditary Cancer Predisposition Syndrome. We, therefore, discussed and recommended the following at today's visit.   DISCUSSION: We reviewed the characteristics, features and inheritance patterns of hereditary cancer syndromes. We also discussed genetic testing, including the appropriate family members to test, the process of testing, insurance coverage and turn-around-time for results. We discussed the implications of a negative, positive and/or variant of uncertain significant result. We recommended Ms. Zenia Resides pursue genetic testing for the CancerNext gene panel.   The CancerNext gene panel + RNAinsight offered by [employer] includes sequencing and rearrangement analysis for the following 32 genes:   APC, ATM, BARD1, BMPR1A, BRCA1, BRCA2, BRIP1, CDH1, CDK4, CDKN2A, CHEK2, DICER1, EPCAM, GREM1, HOXB13MLH1, MRE11A, MSH2, MSH6, MUTYH, NBN, NF1, PALB2, PMS2, POLD1, POLE, PTEN, RAD50, RAD51C, RAD51D, SMAD4, SMARCA4, STK11, and TP53.   We discussed that only 5-10% of  cancers are associated with a Hereditary Cancer Predisposition Syndrome.  The most common hereditary cancer syndrome associated with colon cancer is Lynch Syndrome.  Lynch Syndrome is caused by mutations in the genes: MLH1, MSH2, MSH6, PMS2 and EPCAM.  This syndrome increases the risk for colon, uterine, ovarian and stomach cancers, as well as others.  Families with Lynch Syndrome tend to have multiple family members with these cancers, typically diagnosed under age 54, and diagnoses in multiple generations.    We discussed that there are several other genes that are associated with an increased risk for colon cancer and increased polyp burden (MUTYH, APC, POLE, CHEK2, etc.) We also dicussed that there are many genes that cause many different types of cancer risks.    IHC Testing on Ms. Pinedo's colon caner biopsy revealed: Absent MLH1 and PMS2. Further Colon cancer surgery is not planned at this time.   We discussed that if she is found to have a mutation in one of these genes, it may impact future medical management recommendations such as increased cancer screenings and consideration of risk reducing surgeries.  A positive result could also have implications for the patient's family members.  A Negative result would mean we were unable to identify a hereditary component to her cancer, but does not rule out the possibility of a hereditary basis for her cancer.  There could be mutations that are undetectable by current technology, or in genes not yet tested or identified to increase cancer risk.    We discussed the potential to find a Variant of Uncertain Significance or VUS.  These are variants that have not yet been identified as pathogenic or benign, and it is unknown if this variant is associated with increased cancer risk or if this is a normal finding.  Most VUS's  are reclassified to benign or likely benign.   It should not be used to make medical management decisions. With time, we suspect the lab  will determine the significance of any VUS's identified if any.   Based on Ms. Shuey's personal history of cancer, she meets medical criteria for genetic testing. Despite that she meets criteria, she may still have an out of pocket cost. The laboratory can provide an estimate of her OOP cost.  hospital was provided the contact information for the laboratory if hospital has further questions.   PLAN: After considering the risks, benefits, and limitations, Ms. Zenia Resides  provided informed consent to pursue genetic testing and the blood sample was sent to [facility] for analysis of the CancerNext + RNAinsight Panel. Results should be available within approximately 2-3 weeks' time, at which point they will be disclosed by telephone to Ms. Zenia Resides, as will any additional recommendations warranted by these results. Ms. Hauck will receive a summary of her genetic counseling visit and a copy of her results once available. This information will also be available in Epic. We encouraged Ms. Mullan to remain in contact with cancer genetics annually so that we can continuously update the family history and inform her of any changes in cancer genetics and testing that may be of benefit for her family. Ms. Shifrin questions were answered to her satisfaction today. Our contact information was provided should additional questions or concerns arise.  Lastly, we encouraged Ms. Mosco to remain in contact with cancer genetics annually so that we can continuously update the family history and inform her of any changes in cancer genetics and testing that may be of benefit for this family.   Ms.  Row questions were answered to her satisfaction today. Our contact information was provided should additional questions or concerns arise. Thank you for the referral and allowing [place] to share in the care of your patient.   Tana Felts, MS, [facility] Certified Genetic Counselor [email or web address] phone: [phone]  The  patient was seen for a total of 30 minutes in face-to-face genetic counseling. Dr.'s Norman Clay, and Magrinat were available for questions regarding this case.

## 2018-03-07 NOTE — Anesthesia Postprocedure Evaluation (Signed)
Anesthesia Post Note  Patient: Toni Peterson  Procedure(s) Performed: TANDEM RING INSERTION (N/A Vagina )     Patient location during evaluation: PACU Anesthesia Type: General Level of consciousness: awake and alert, patient cooperative and oriented Pain management: pain level controlled Vital Signs Assessment: post-procedure vital signs reviewed and stable Respiratory status: spontaneous breathing, nonlabored ventilation and respiratory function stable Cardiovascular status: blood pressure returned to baseline and stable Postop Assessment: no apparent nausea or vomiting Anesthetic complications: no    Last Vitals:  Vitals:   03/07/18 0830 03/07/18 0845  BP: (!) 148/82 (!) 141/80  Pulse: 67 69  Resp: 15 14  Temp:    SpO2: 100% 93%    Last Pain:  Vitals:   03/07/18 0845  TempSrc:   PainSc: 0-No pain                 Filbert Craze,E. Aylssa Herrig

## 2018-03-08 ENCOUNTER — Encounter (HOSPITAL_BASED_OUTPATIENT_CLINIC_OR_DEPARTMENT_OTHER): Payer: Self-pay | Admitting: Radiation Oncology

## 2018-03-08 ENCOUNTER — Other Ambulatory Visit: Payer: Self-pay

## 2018-03-08 NOTE — Progress Notes (Signed)
Spoke with patient via telephone for pre op interview. NPO after MN. Patient to take atenolol, norvasc and nausea medication if needed AM of surgery with a sip of water. Patient aware she needs a ride, and will let us know who it will be when she gets here. Arrival time 34.

## 2018-03-08 NOTE — Addendum Note (Signed)
Encounter addended by: Loma Sousa, RN on: 03/08/2018 5:47 PM  Actions taken: Clinical Note Signed, LDA properties accepted

## 2018-03-08 NOTE — Progress Notes (Signed)
LATE ENTRY:  1020:  Received pt from [facility] OP PACU. Pt transported via stretcher to [place]. Pt returned to nursing room 1 via stretcher from [facility]. Foley patent and draining clear, yellow urine. IVF infusing without difficulty via PIV. Pt taking PO without difficulty. Loma Sousa, RN BSN    1300: Foley removed without difficulty, pt tolerated well. PIV removed, tip intact. Tandem and Ring device removed per Dr. Sondra Come. Pt tolerated well. This RN assisted with pt dressing. Pt discharged via [facility] to friend and private vehicle. Pt aware of discharge instructions.  Loma Sousa, RN BSN

## 2018-03-14 ENCOUNTER — Ambulatory Visit: Payer: BC Managed Care – PPO | Admitting: Radiation Oncology

## 2018-03-14 ENCOUNTER — Encounter (HOSPITAL_BASED_OUTPATIENT_CLINIC_OR_DEPARTMENT_OTHER): Payer: Self-pay

## 2018-03-16 ENCOUNTER — Ambulatory Visit (HOSPITAL_COMMUNITY)
Admission: RE | Admit: 2018-03-16 | Discharge: 2018-03-16 | Disposition: A | Discharge status: Home / Self Care | Payer: BC Managed Care – PPO | Source: Ambulatory Visit | Attending: Radiation Oncology | Admitting: Radiation Oncology

## 2018-03-16 ENCOUNTER — Ambulatory Visit (HOSPITAL_BASED_OUTPATIENT_CLINIC_OR_DEPARTMENT_OTHER): Payer: BC Managed Care – PPO | Admitting: Anesthesiology

## 2018-03-16 ENCOUNTER — Ambulatory Visit
Admission: RE | Admit: 2018-03-16 | Discharge: 2018-03-16 | Disposition: A | Discharge status: Home / Self Care | Payer: BC Managed Care – PPO | Source: Ambulatory Visit | Attending: Radiation Oncology | Admitting: Radiation Oncology

## 2018-03-16 ENCOUNTER — Other Ambulatory Visit: Payer: Self-pay

## 2018-03-16 ENCOUNTER — Encounter (HOSPITAL_BASED_OUTPATIENT_CLINIC_OR_DEPARTMENT_OTHER): Payer: Self-pay | Admitting: Emergency Medicine

## 2018-03-16 ENCOUNTER — Ambulatory Visit (HOSPITAL_BASED_OUTPATIENT_CLINIC_OR_DEPARTMENT_OTHER)
Admission: RE | Admit: 2018-03-16 | Discharge: 2018-03-16 | Disposition: A | Discharge status: Other Acute Inpatient Hospital | Payer: BC Managed Care – PPO | Attending: Radiation Oncology | Admitting: Radiation Oncology

## 2018-03-16 ENCOUNTER — Encounter (HOSPITAL_BASED_OUTPATIENT_CLINIC_OR_DEPARTMENT_OTHER)
Admission: RE | Disposition: A | Discharge status: Other Acute Inpatient Hospital | Payer: Self-pay | Source: Home / Self Care | Attending: Radiation Oncology

## 2018-03-16 DIAGNOSIS — C530 Malignant neoplasm of endocervix: Secondary | ICD-10-CM

## 2018-03-16 DIAGNOSIS — Z79899 Other long term (current) drug therapy: Secondary | ICD-10-CM | POA: Diagnosis not present

## 2018-03-16 DIAGNOSIS — I10 Essential (primary) hypertension: Secondary | ICD-10-CM | POA: Diagnosis not present

## 2018-03-16 DIAGNOSIS — C539 Malignant neoplasm of cervix uteri, unspecified: Secondary | ICD-10-CM

## 2018-03-16 DIAGNOSIS — E119 Type 2 diabetes mellitus without complications: Secondary | ICD-10-CM | POA: Insufficient documentation

## 2018-03-16 DIAGNOSIS — E785 Hyperlipidemia, unspecified: Secondary | ICD-10-CM | POA: Diagnosis not present

## 2018-03-16 DIAGNOSIS — C7951 Secondary malignant neoplasm of bone: Secondary | ICD-10-CM | POA: Diagnosis not present

## 2018-03-16 DIAGNOSIS — Z6833 Body mass index (BMI) 33.0-33.9, adult: Secondary | ICD-10-CM | POA: Insufficient documentation

## 2018-03-16 DIAGNOSIS — Z87891 Personal history of nicotine dependence: Secondary | ICD-10-CM | POA: Insufficient documentation

## 2018-03-16 HISTORY — PX: TANDEM RING INSERTION: SHX6199

## 2018-03-16 SURGERY — INSERTION, UTERINE TANDEM AND RING OR CYLINDER, FOR BRACHYTHERAPY
Anesthesia: General | Site: Cervix

## 2018-03-16 MED ORDER — SODIUM CHLORIDE 0.9 % IR SOLN
Status: DC | PRN
  Administered 2018-03-16: 1000 mL via INTRAVESICAL

## 2018-03-16 MED ORDER — DEXAMETHASONE SODIUM PHOSPHATE 4 MG/ML IJ SOLN
INTRAMUSCULAR | Status: DC | PRN
  Administered 2018-03-16: 10 mg via INTRAVENOUS

## 2018-03-16 MED ORDER — FENTANYL CITRATE (PF) 100 MCG/2ML IJ SOLN
25.0000 ug | INTRAMUSCULAR | Status: DC | PRN
  Administered 2018-03-16: 25 ug via INTRAVENOUS
  Administered 2018-03-16: 50 ug via INTRAVENOUS
  Administered 2018-03-16: 25 ug via INTRAVENOUS
  Filled 2018-03-16: qty 1

## 2018-03-16 MED ORDER — LIDOCAINE HCL (CARDIAC) PF 100 MG/5ML IV SOSY
PREFILLED_SYRINGE | INTRAVENOUS | Status: DC | PRN
  Administered 2018-03-16: 80 mg via INTRAVENOUS

## 2018-03-16 MED ORDER — OXYCODONE HCL 5 MG PO TABS
5.0000 mg | ORAL_TABLET | Freq: Once | ORAL | Status: DC | PRN
  Filled 2018-03-16: qty 1

## 2018-03-16 MED ORDER — MIDAZOLAM HCL 2 MG/2ML IJ SOLN
INTRAMUSCULAR | Status: AC
  Filled 2018-03-16: qty 2

## 2018-03-16 MED ORDER — ONDANSETRON HCL 4 MG/2ML IJ SOLN
INTRAMUSCULAR | Status: AC
  Filled 2018-03-16: qty 2

## 2018-03-16 MED ORDER — ONDANSETRON HCL 4 MG/2ML IJ SOLN
INTRAMUSCULAR | Status: DC | PRN
  Administered 2018-03-16: 8 mg via INTRAVENOUS

## 2018-03-16 MED ORDER — ACETAMINOPHEN 500 MG PO TABS
1000.0000 mg | ORAL_TABLET | Freq: Once | ORAL | Status: AC
  Administered 2018-03-16: 1000 mg via ORAL
  Filled 2018-03-16: qty 2

## 2018-03-16 MED ORDER — FENTANYL CITRATE (PF) 100 MCG/2ML IJ SOLN
INTRAMUSCULAR | Status: AC
  Filled 2018-03-16: qty 2

## 2018-03-16 MED ORDER — PROPOFOL 10 MG/ML IV BOLUS
INTRAVENOUS | Status: AC
  Filled 2018-03-16: qty 20

## 2018-03-16 MED ORDER — ACETAMINOPHEN 325 MG PO TABS
325.0000 mg | ORAL_TABLET | ORAL | Status: DC | PRN
  Filled 2018-03-16: qty 2

## 2018-03-16 MED ORDER — KETOROLAC TROMETHAMINE 30 MG/ML IJ SOLN
INTRAMUSCULAR | Status: DC | PRN
  Administered 2018-03-16: 30 mg via INTRAVENOUS

## 2018-03-16 MED ORDER — DEXAMETHASONE SODIUM PHOSPHATE 10 MG/ML IJ SOLN
INTRAMUSCULAR | Status: AC
  Filled 2018-03-16: qty 1

## 2018-03-16 MED ORDER — LACTATED RINGERS IV SOLN
INTRAVENOUS | Status: DC
  Administered 2018-03-16: 08:00:00 via INTRAVENOUS
  Filled 2018-03-16: qty 1000

## 2018-03-16 MED ORDER — MIDAZOLAM HCL 5 MG/5ML IJ SOLN
INTRAMUSCULAR | Status: DC | PRN
  Administered 2018-03-16: 2 mg via INTRAVENOUS

## 2018-03-16 MED ORDER — FENTANYL CITRATE (PF) 100 MCG/2ML IJ SOLN
INTRAMUSCULAR | Status: DC | PRN
  Administered 2018-03-16 (×8): 25 ug via INTRAVENOUS

## 2018-03-16 MED ORDER — ONDANSETRON HCL 4 MG/2ML IJ SOLN
4.0000 mg | Freq: Once | INTRAMUSCULAR | Status: DC | PRN
  Filled 2018-03-16: qty 2

## 2018-03-16 MED ORDER — PROPOFOL 10 MG/ML IV BOLUS
INTRAVENOUS | Status: DC | PRN
  Administered 2018-03-16: 150 mg via INTRAVENOUS
  Administered 2018-03-16: 50 mg via INTRAVENOUS

## 2018-03-16 MED ORDER — SCOPOLAMINE 1 MG/3DAYS TD PT72
MEDICATED_PATCH | TRANSDERMAL | Status: AC
  Filled 2018-03-16: qty 1

## 2018-03-16 MED ORDER — MEPERIDINE HCL 25 MG/ML IJ SOLN
6.2500 mg | INTRAMUSCULAR | Status: DC | PRN
  Filled 2018-03-16: qty 1

## 2018-03-16 MED ORDER — ACETAMINOPHEN 500 MG PO TABS
ORAL_TABLET | ORAL | Status: AC
  Filled 2018-03-16: qty 2

## 2018-03-16 MED ORDER — ACETAMINOPHEN 160 MG/5ML PO SOLN
325.0000 mg | ORAL | Status: DC | PRN
  Filled 2018-03-16: qty 20.3

## 2018-03-16 MED ORDER — SCOPOLAMINE 1 MG/3DAYS TD PT72
1.0000 | MEDICATED_PATCH | TRANSDERMAL | Status: DC
  Administered 2018-03-16: 1.5 mg via TRANSDERMAL
  Filled 2018-03-16: qty 1

## 2018-03-16 MED ORDER — METOCLOPRAMIDE HCL 5 MG/ML IJ SOLN
INTRAMUSCULAR | Status: AC
  Filled 2018-03-16: qty 2

## 2018-03-16 MED ORDER — LACTATED RINGERS IV SOLN
INTRAVENOUS | Status: DC
  Administered 2018-03-16: 12:00:00 via INTRAVENOUS
  Filled 2018-03-16 (×2): qty 250

## 2018-03-16 MED ORDER — METOCLOPRAMIDE HCL 5 MG/ML IJ SOLN
INTRAMUSCULAR | Status: DC | PRN
  Administered 2018-03-16: 10 mg via INTRAVENOUS

## 2018-03-16 MED ORDER — OXYCODONE HCL 5 MG/5ML PO SOLN
5.0000 mg | Freq: Once | ORAL | Status: DC | PRN
  Filled 2018-03-16: qty 5

## 2018-03-16 SURGICAL SUPPLY — 21 items
BAG URINE DRAINAGE (UROLOGICAL SUPPLIES) ×4 IMPLANT
BNDG CONFORM 2 STRL LF (GAUZE/BANDAGES/DRESSINGS) IMPLANT
CATH FOLEY 2WAY SLVR  5CC 16FR (CATHETERS) ×2
CATH FOLEY 2WAY SLVR 5CC 16FR (CATHETERS) ×2 IMPLANT
COVER WAND RF STERILE (DRAPES) ×4 IMPLANT
DILATOR CANAL MILEX (MISCELLANEOUS) IMPLANT
GAUZE 4X4 16PLY RFD (DISPOSABLE) ×4 IMPLANT
GLOVE BIO SURGEON STRL SZ7.5 (GLOVE) ×8 IMPLANT
GOWN STRL REUS W/TWL LRG LVL3 (GOWN DISPOSABLE) ×4 IMPLANT
HOLDER FOLEY CATH W/STRAP (MISCELLANEOUS) ×4 IMPLANT
HOVERMATT SINGLE USE (MISCELLANEOUS) ×4 IMPLANT
IV NS 1000ML (IV SOLUTION) ×4
IV NS 1000ML BAXH (IV SOLUTION) ×2 IMPLANT
KIT TURNOVER CYSTO (KITS) ×4 IMPLANT
PACK VAGINAL MINOR WOMEN LF (CUSTOM PROCEDURE TRAY) ×4 IMPLANT
PACKING VAGINAL (PACKING) IMPLANT
PAD ABD 8X10 STRL (GAUZE/BANDAGES/DRESSINGS) ×4 IMPLANT
PAD OB MATERNITY 4.3X12.25 (PERSONAL CARE ITEMS) IMPLANT
SET IRRIG Y TYPE TUR BLADDER L (SET/KITS/TRAYS/PACK) ×4 IMPLANT
TOWEL OR 17X24 6PK STRL BLUE (TOWEL DISPOSABLE) ×8 IMPLANT
WATER STERILE IRR 500ML POUR (IV SOLUTION) ×4 IMPLANT

## 2018-03-16 NOTE — Interval H&P Note (Signed)
History and Physical Interval Note:  03/16/2018 9:51 AM  Toni Peterson  has presented today for surgery, with the diagnosis of cervical cancer  The various methods of treatment have been discussed with the patient and family. After consideration of risks, benefits and other options for treatment, the patient has consented to  Procedure(s): TANDEM RING INSERTION (N/A) OPERATIVE ULTRASOUND, PELVIS LIMITED AND NEEDLE GUIDED ULTRASOUND (N/A) as a surgical intervention .  The patient's history has been reviewed, patient examined, no change in status, stable for surgery.  I have reviewed the patient's chart and labs.  Questions were answered to the patient's satisfaction.     Gery Pray

## 2018-03-16 NOTE — Addendum Note (Signed)
Addendum  created 03/16/18 2032 by Janeece Riggers, MD   Clinical Note Signed

## 2018-03-16 NOTE — Anesthesia Preprocedure Evaluation (Addendum)
Anesthesia Evaluation  Patient identified by MRN, date of birth, ID band Patient awake    Reviewed: Allergy & Precautions, NPO status , Patient's Chart, lab work & pertinent test results, reviewed documented beta blocker date and time   History of Anesthesia Complications (+) PONV  Airway Mallampati: I  TM Distance: >3 FB Neck ROM: Full    Dental  (+) Dental Advisory Given, Missing   Pulmonary former smoker,    breath sounds clear to auscultation       Cardiovascular hypertension, Pt. on medications and Pt. on home beta blockers (-) angina Rhythm:Regular Rate:Normal  11/19 ECHO: EF 60-65%, valves ok   Neuro/Psych negative neurological ROS     GI/Hepatic negative GI ROS, Neg liver ROS,   Endo/Other  diabetesMorbid obesity  Renal/GU negative Renal ROS     Musculoskeletal   Abdominal (+) + obese,   Peds  Hematology negative hematology ROS (+)   Anesthesia Other Findings   Reproductive/Obstetrics                             Anesthesia Physical  Anesthesia Plan  ASA: II  Anesthesia Plan: General   Post-op Pain Management:    Induction: Intravenous  PONV Risk Score and Plan: 4 or greater and Scopolamine patch - Pre-op, Dexamethasone, Ondansetron and Treatment may vary due to age or medical condition  Airway Management Planned: LMA  Additional Equipment:   Intra-op Plan:   Post-operative Plan:   Informed Consent: I have reviewed the patients History and Physical, chart, labs and discussed the procedure including the risks, benefits and alternatives for the proposed anesthesia with the patient or authorized representative who has indicated his/her understanding and acceptance.     Dental advisory given  Plan Discussed with: CRNA and Surgeon  Anesthesia Plan Comments: (Plan routine monitors, GETA Pre-op PO tylenol ordered as per surgeon )        Anesthesia Quick  Evaluation

## 2018-03-16 NOTE — Op Note (Signed)
03/16/2018  11:19 AM  PATIENT:  Toni Peterson  68 y.o. female  PRE-OPERATIVE DIAGNOSIS:  cervical cancer  POST-OPERATIVE DIAGNOSIS:  cervical cancer  PROCEDURE:  Procedure(s): TANDEM RING INSERTION FOR HIGH DOSE RATE RADIATION THERAPY WITH ULTRASOUND (N/A)  SURGEON:  Surgeon(s) and Role:    * Gery Pray, MD - Primary  PHYSICIAN ASSISTANT:   ASSISTANTS: none   ANESTHESIA:   general  EBL:  10 mL   BLOOD ADMINISTERED:none  DRAINS: Urinary Catheter (Foley)   LOCAL MEDICATIONS USED:  NONE  SPECIMEN:  No Specimen  DISPOSITION OF SPECIMEN:  N/A  COUNTS:  YES  TOURNIQUET:  * No tourniquets in log *  DICTATION: The patient was transported to the outpatient surgical center room #3. Patient was prepped and draped in the usual sterile fashion placed in the dorsal lithotomy position.A timeout for the procedure, estimated length of the procedure and preoperative medications with performed. Patient had a Foley catheter placed with ~ 200 mL of saline backfilled into the bladder for ultrasound imaging purposes. Exam under anesthesia revealed the cervical mass to be significantly decreased in size and softer on exam.  the cervical area estimated to be approximately 2.5 x 2.0 cm in size. No parametrial extension. Patient proceeded to undergo dilation of the cervical os and sounding of the uterus. The uterus was noted to be expanded and on ultrasound this appeared to be multiple fibroids. Once the cervical os was dilated serosanguineous fluid and some blood  drained from the uterus. On ultrasound the uterus sounded to approximately 8.5 cm. Patient proceeded to undergo dilation of the cervical os and then placement of a 60 mm cervical sleeve. Good ultrasound imaging was obtained. The uterus was noted to be in the anteverted position. Patient then had placement of a 60 mm 45 tandem within the cervical sleeve in the endocervical canal and midportion of the uterus. Patient then had a 45ring with  small shielding cap placed along the cervical region. This was then followed by placement of a rectal paddle posteriorly. Patient tolerated the procedure well. Later in the day the patient will be transported to the radiation oncology Department for planning and her third high-dose-rate treatment. The patient is planned to have a total of 5 high-dose rate treatments directed at the cervical region.   PLAN OF CARE: transferred to radiation oncology for planning and treatment  PATIENT DISPOSITION:  PACU - hemodynamically stable.   Delay start of Pharmacological VTE agent (>24hrs) due to surgical blood loss or risk of bleeding: not applicable

## 2018-03-16 NOTE — Progress Notes (Signed)
  Radiation Oncology         [phone] ________________________________  Name: Toni Peterson MRN: [id number]  Date: 03/16/2018  DOB: 09/11/1950  CC: Antony Contras, MD  Everitt Amber, MD  HDR BRACHYTHERAPY NOTE  DIAGNOSIS: 68 y.o. female with cervical cancer  NARRATIVE: The patient was brought to the [place] suite. Identity was confirmed. All relevant records and images related to the planned course of therapy were reviewed. The patient freely provided informed written consent to proceed with treatment after reviewing the details related to the planned course of therapy. The consent form was witnessed and verified by the simulation staff. Then, the patient was set-up in a stable reproducible supine position for radiation therapy. The tandem ring system was accessed and fiducial markers were placed within the tandem and ring.   Simple treatment device note: On the operating room the patient had construction of her custom tandem ring system. She will be treated with a 45 tandem/ring system. The patient had placement of a 60 mm tandem. A cervical ring with a small shielding was used for her treatment. A rectal paddle was also part of her custom set up device.  Verification simulation note: An AP and lateral film was obtained through the pelvis area. This was compared to the patient's planning films documenting accurate position of the tandem/ring system for treatment.  High-dose-rate brachytherapy treatment note:  The remote afterloading device was accessed through catheter system and attached to the tandem ring system. Patient then proceeded to undergo her third high-dose-rate treatment directed at the cervix. The patient was prescribed a dose of 5.5 gray to be delivered to the high-risk clinical target volume.. Patient was treated with 2 channels using 25 dwell positions. Treatment time was 416.0 seconds. The patient tolerated the procedure well. After completion of her therapy, a radiation survey  was performed documenting return of the iridium source into the GammaMed safe. The patient was then transferred to the nursing suite. She then had removal of the rectal paddle followed by the tandem and ring system. The patient tolerated the removal well.  PLAN: The patient will return on 03/20/2018 for her fourth HDR treatment. ________________________________  Blair Promise, PhD, MD  This document serves as a record of services personally performed by Gery Pray, MD. It was created on his behalf by Rae Lips, a trained medical scribe. The creation of this record is based on the scribe's personal observations and the provider's statements to them. This document has been checked and approved by the attending provider.

## 2018-03-16 NOTE — Progress Notes (Signed)
1125: Received pt from [place] PACU. Pt awake and aware of surroundings. Pt transported to [facility] [employer]. Loma Sousa, RN BSN   1215: Pt received from CT Sim. Pt resting quietly on stretcher. Pt taking PO without difficulty. Lunch tray at bedside. IVF infusing without difficulty via PIV. Foley patent and draining clear yellow urine. Loma Sousa, RN BSN   [phone]: Foley removed per this RN without difficulty, good tolerance. PIV removed with tip intact, good tolerance. Tandem and Ring device removed per Dr. Sondra Come, pt tolerated well. This RN assisted pt with dressing. Pt discharged to family member/personal vehicle via [place]. Pt knowledgeable re: discharge instructions. Loma Sousa, RN BSN

## 2018-03-16 NOTE — Discharge Instructions (Signed)

## 2018-03-16 NOTE — Anesthesia Postprocedure Evaluation (Signed)
Anesthesia Post Note  Patient: Toni Peterson  Procedure(s) Performed: TANDEM RING INSERTION FOR HIGH DOSE RATE RADIATION THERAPY WITH ULTRASOUND (N/A Cervix)     Anesthesia Post Evaluation  Last Vitals:  Vitals:   03/16/18 1115 03/16/18 1130  BP: 125/77   Pulse: 72   Resp: 13   Temp:    SpO2: 90% 92%    Last Pain:  Vitals:   03/16/18 1130  TempSrc:   PainSc: 4                  Dakiya Puopolo

## 2018-03-16 NOTE — Transfer of Care (Signed)
Immediate Anesthesia Transfer of Care Note  Patient: Toni Peterson  Procedure(s) Performed: Procedure(s) (LRB): TANDEM RING INSERTION FOR HIGH DOSE RATE RADIATION THERAPY WITH ULTRASOUND (N/A)  Patient Location: PACU  Anesthesia Type: General  Level of Consciousness: awake, sedated, patient cooperative and responds to stimulation  Airway & Oxygen Therapy: Patient Spontanous Breathing and Patient connected to [place] oxygen  Post-op Assessment: Report given to PACU RN, Post -op Vital signs reviewed and stable and Patient moving all extremities  Post vital signs: Reviewed and stable  Complications: No apparent anesthesia complications

## 2018-03-16 NOTE — Anesthesia Postprocedure Evaluation (Signed)
Anesthesia Post Note  Patient: Toni Peterson  Procedure(s) Performed: TANDEM RING INSERTION FOR HIGH DOSE RATE RADIATION THERAPY WITH ULTRASOUND (N/A Cervix)     Anesthesia Post Evaluation  Last Vitals:  Vitals:   03/16/18 1115 03/16/18 1130  BP: 125/77   Pulse: 72   Resp: 13   Temp:    SpO2: 90% 92%    Last Pain:  Vitals:   03/16/18 1130  TempSrc:   PainSc: 4                  Mica Releford

## 2018-03-16 NOTE — Progress Notes (Signed)
  Radiation Oncology         [phone] ________________________________  Name: Toni Peterson MRN: [id number]  Date: 03/16/2018  DOB: 1950/10/25  SIMULATION AND TREATMENT PLANNING NOTE HDR BRACHYTHERAPY  DIAGNOSIS:  68 y.o. female with Cervical cancer  NARRATIVE:  The patient was brought to the [place] suite.  Identity was confirmed.  All relevant records and images related to the planned course of therapy were reviewed.  The patient freely provided informed written consent to proceed with treatment after reviewing the details related to the planned course of therapy. The consent form was witnessed and verified by the simulation staff.  Then, the patient was set-up in a stable reproducible  supine position for radiation therapy.  CT images were obtained.  Surface markings were placed.  The CT images were loaded into the planning software.  Then the target and avoidance structures were contoured.  Treatment planning then occurred.  The radiation prescription was entered and confirmed.   I have requested : Brachytherapy Isodose Plan and Dosimetry Calculations to plan the radiation distribution.    PLAN:  The patient will receive 5.5 Gy in 1 fraction. The patient will be treated with a tandem ring system. Iridium 192 will be the high-dose-rate source.    ________________________________  Blair Promise, PhD, MD  This document serves as a record of services personally performed by Gery Pray, MD. It was created on his behalf by Rae Lips, a trained medical scribe. The creation of this record is based on the scribe's personal observations and the provider's statements to them. This document has been checked and approved by the attending provider.

## 2018-03-16 NOTE — Addendum Note (Signed)
Encounter addended by: Loma Sousa, RN on: 03/16/2018 5:44 PM  Actions taken: Clinical Note Signed

## 2018-03-16 NOTE — Anesthesia Procedure Notes (Signed)
Procedure Name: LMA Insertion Date/Time: 03/16/2018 10:13 AM Performed by: Justice Rocher, CRNA Pre-anesthesia Checklist: Emergency Drugs available, Patient identified, Suction available and Patient being monitored Patient Re-evaluated:Patient Re-evaluated prior to induction Oxygen Delivery Method: Circle system utilized Preoxygenation: Pre-oxygenation with 100% oxygen Induction Type: IV induction Ventilation: Mask ventilation without difficulty LMA: LMA inserted LMA Size: 4.0 Number of attempts: 1 Placement Confirmation: breath sounds checked- equal and bilateral,  CO2 detector and positive ETCO2 Tube secured with: Tape Dental Injury: Teeth and Oropharynx as per pre-operative assessment

## 2018-03-17 ENCOUNTER — Inpatient Hospital Stay: Payer: BC Managed Care – PPO

## 2018-03-17 ENCOUNTER — Encounter (HOSPITAL_BASED_OUTPATIENT_CLINIC_OR_DEPARTMENT_OTHER): Payer: Self-pay | Admitting: Radiation Oncology

## 2018-03-17 ENCOUNTER — Inpatient Hospital Stay: Payer: BC Managed Care – PPO | Attending: Hematology and Oncology

## 2018-03-17 ENCOUNTER — Other Ambulatory Visit: Payer: Self-pay

## 2018-03-17 ENCOUNTER — Telehealth: Payer: Self-pay | Admitting: *Deleted

## 2018-03-17 ENCOUNTER — Other Ambulatory Visit: Payer: Self-pay | Admitting: Hematology and Oncology

## 2018-03-17 DIAGNOSIS — C530 Malignant neoplasm of endocervix: Secondary | ICD-10-CM

## 2018-03-17 DIAGNOSIS — D6481 Anemia due to antineoplastic chemotherapy: Secondary | ICD-10-CM

## 2018-03-17 DIAGNOSIS — T451X5A Adverse effect of antineoplastic and immunosuppressive drugs, initial encounter: Secondary | ICD-10-CM

## 2018-03-17 DIAGNOSIS — N390 Urinary tract infection, site not specified: Secondary | ICD-10-CM | POA: Insufficient documentation

## 2018-03-17 DIAGNOSIS — C7951 Secondary malignant neoplasm of bone: Secondary | ICD-10-CM | POA: Insufficient documentation

## 2018-03-17 DIAGNOSIS — C539 Malignant neoplasm of cervix uteri, unspecified: Secondary | ICD-10-CM

## 2018-03-17 DIAGNOSIS — Z452 Encounter for adjustment and management of vascular access device: Secondary | ICD-10-CM | POA: Diagnosis not present

## 2018-03-17 LAB — COMPREHENSIVE METABOLIC PANEL
ALT: 14 U/L (ref 0–44)
AST: 14 U/L — ABNORMAL LOW (ref 15–41)
Albumin: 2.9 g/dL — ABNORMAL LOW (ref 3.5–5.0)
Alkaline Phosphatase: 76 U/L (ref 38–126)
Anion gap: 9 (ref 5–15)
BUN: 16 mg/dL (ref 8–23)
CO2: 26 mmol/L (ref 22–32)
Calcium: 9.3 mg/dL (ref 8.9–10.3)
Chloride: 103 mmol/L (ref 98–111)
Creatinine, Ser: 0.72 mg/dL (ref 0.44–1.00)
GFR calc Af Amer: >60 mL/min (ref >60)
GFR calc non Af Amer: >60 mL/min (ref >60)
Glucose, Bld: 104 mg/dL — ABNORMAL HIGH (ref 70–99)
Potassium: 4.1 mmol/L (ref 3.5–5.1)
Sodium: 138 mmol/L (ref 135–145)
TOTAL PROTEIN: 6.2 g/dL — AB (ref 6.5–8.1)
Total Bilirubin: 0.5 mg/dL (ref 0.3–1.2)

## 2018-03-17 LAB — MAGNESIUM: Magnesium: 2 mg/dL (ref 1.7–2.4)

## 2018-03-17 LAB — CBC WITH DIFFERENTIAL/PLATELET
Abs Immature Granulocytes: 0.42 10*3/uL — ABNORMAL HIGH (ref 0.00–0.07)
Basophils Absolute: 0 10*3/uL (ref 0.0–0.1)
Basophils Relative: 0 %
EOS ABS: 0 10*3/uL (ref 0.0–0.5)
Eosinophils Relative: 0 %
HEMATOCRIT: 23.2 % — AB (ref 36.0–46.0)
Hemoglobin: 7.4 g/dL — ABNORMAL LOW (ref 12.0–15.0)
Immature Granulocytes: 5 %
Lymphocytes Relative: 11 %
Lymphs Abs: 0.9 10*3/uL (ref 0.7–4.0)
MCH: 30.8 pg (ref 26.0–34.0)
MCHC: 31.9 g/dL (ref 30.0–36.0)
MCV: 96.7 fL (ref 80.0–100.0)
Monocytes Absolute: 1 10*3/uL (ref 0.1–1.0)
Monocytes Relative: 12 %
Neutro Abs: 6.2 10*3/uL (ref 1.7–7.7)
Neutrophils Relative %: 72 %
Platelets: 356 10*3/uL (ref 150–400)
RBC: 2.4 MIL/uL — ABNORMAL LOW (ref 3.87–5.11)
RDW: 20.8 % — AB (ref 11.5–15.5)
WBC: 8.6 10*3/uL (ref 4.0–10.5)
nRBC: 4.3 % — ABNORMAL HIGH (ref 0.0–0.2)

## 2018-03-17 LAB — SAMPLE TO BLOOD BANK

## 2018-03-17 MED ORDER — SODIUM CHLORIDE 0.9% FLUSH
10.0000 mL | Freq: Once | INTRAVENOUS | Status: AC
  Administered 2018-03-17: 10 mL
  Filled 2018-03-17: qty 10

## 2018-03-17 MED ORDER — HEPARIN SOD (PORK) LOCK FLUSH 100 UNIT/ML IV SOLN
500.0000 [IU] | Freq: Once | INTRAVENOUS | Status: AC
  Administered 2018-03-17: 500 [IU]
  Filled 2018-03-17: qty 5

## 2018-03-17 NOTE — Telephone Encounter (Signed)
Called pt & informed of low Hgb.  She states she is tired but doing everything she normally does & is shocked.  Requested she come back to office for labs-T & CM & receive blood on Sat.  Explained that she needs to have Hgb higher for radiation.  Discussed safety of blood supply after she reports never having blood before & stating this is scary.  She will come back for labs.  Scheduling message sent.

## 2018-03-17 NOTE — Progress Notes (Signed)
Spoke with:  Maritta NPO:  After Midnight, no gum, candy, or mints   Arrival time:  0830AM Labs: Labs in epic AM medications: Lorazepam and Zofran if needed Pre op orders: No  Ride home:  Karl Bales [phone]

## 2018-03-18 ENCOUNTER — Inpatient Hospital Stay: Payer: BC Managed Care – PPO

## 2018-03-18 DIAGNOSIS — D6481 Anemia due to antineoplastic chemotherapy: Secondary | ICD-10-CM

## 2018-03-18 DIAGNOSIS — C539 Malignant neoplasm of cervix uteri, unspecified: Secondary | ICD-10-CM | POA: Diagnosis not present

## 2018-03-18 DIAGNOSIS — C530 Malignant neoplasm of endocervix: Secondary | ICD-10-CM

## 2018-03-18 DIAGNOSIS — T451X5A Adverse effect of antineoplastic and immunosuppressive drugs, initial encounter: Secondary | ICD-10-CM

## 2018-03-18 LAB — PREPARE RBC (CROSSMATCH)

## 2018-03-18 MED ORDER — ACETAMINOPHEN 325 MG PO TABS
ORAL_TABLET | ORAL | Status: AC
  Filled 2018-03-18: qty 2

## 2018-03-18 MED ORDER — HEPARIN SOD (PORK) LOCK FLUSH 100 UNIT/ML IV SOLN
500.0000 [IU] | Freq: Every day | INTRAVENOUS | Status: AC | PRN
  Administered 2018-03-18: 500 [IU]
  Filled 2018-03-18: qty 5

## 2018-03-18 MED ORDER — SODIUM CHLORIDE 0.9% IV SOLUTION
250.0000 mL | Freq: Once | INTRAVENOUS | Status: AC
  Administered 2018-03-18: 250 mL via INTRAVENOUS
  Filled 2018-03-18: qty 250

## 2018-03-18 MED ORDER — SODIUM CHLORIDE 0.9% FLUSH
3.0000 mL | INTRAVENOUS | Status: DC | PRN
  Filled 2018-03-18: qty 10

## 2018-03-18 MED ORDER — DIPHENHYDRAMINE HCL 25 MG PO CAPS
25.0000 mg | ORAL_CAPSULE | Freq: Once | ORAL | Status: AC
  Administered 2018-03-18: 25 mg via ORAL

## 2018-03-18 MED ORDER — SODIUM CHLORIDE 0.9% FLUSH
10.0000 mL | INTRAVENOUS | Status: AC | PRN
  Administered 2018-03-18: 10 mL
  Filled 2018-03-18: qty 10

## 2018-03-18 MED ORDER — ACETAMINOPHEN 325 MG PO TABS
650.0000 mg | ORAL_TABLET | Freq: Once | ORAL | Status: AC
  Administered 2018-03-18: 650 mg via ORAL

## 2018-03-18 MED ORDER — HEPARIN SOD (PORK) LOCK FLUSH 100 UNIT/ML IV SOLN
250.0000 [IU] | INTRAVENOUS | Status: DC | PRN
  Filled 2018-03-18: qty 5

## 2018-03-18 MED ORDER — DIPHENHYDRAMINE HCL 25 MG PO CAPS
ORAL_CAPSULE | ORAL | Status: AC
  Filled 2018-03-18: qty 1

## 2018-03-18 NOTE — Patient Instructions (Signed)
Blood Transfusion, Adult, Care After This sheet gives you information about how to care for yourself after your procedure. Your doctor may also give you more specific instructions. If you have problems or questions, contact your doctor. Follow these instructions at home:   Take over-the-counter and prescription medicines only as told by your doctor.  Go back to your normal activities as told by your doctor.  Follow instructions from your doctor about how to take care of the area where an IV tube was put into your vein (insertion site). Make sure you: ? Wash your hands with soap and water before you change your bandage (dressing). If there is no soap and water, use hand sanitizer. ? Change your bandage as told by your doctor.  Check your IV insertion site every day for signs of infection. Check for: ? More redness, swelling, or pain. ? More fluid or blood. ? Warmth. ? Pus or a bad smell. Contact a doctor if:  You have more redness, swelling, or pain around the IV insertion site.  You have more fluid or blood coming from the IV insertion site.  Your IV insertion site feels warm to the touch.  You have pus or a bad smell coming from the IV insertion site.  Your pee (urine) turns pink, red, or brown.  You feel weak after doing your normal activities. Get help right away if:  You have signs of a serious allergic or body defense (immune) system reaction, including: ? Itchiness. ? Hives. ? Trouble breathing. ? Anxiety. ? Pain in your chest or lower back. ? Fever, flushing, and chills. ? Fast pulse. ? Rash. ? Watery poop (diarrhea). ? Throwing up (vomiting). ? Dark pee. ? Serious headache. ? Dizziness. ? Stiff neck. ? Yellow color in your face or the white parts of your eyes (jaundice). Summary  After a blood transfusion, return to your normal activities as told by your doctor.  Every day, check for signs of infection where the IV tube was put into your vein.  Some  signs of infection are warm skin, more redness and pain, more fluid or blood, and pus or a bad smell where the needle went in.  Contact your doctor if you feel weak or have any unusual symptoms. This information is not intended to replace advice given to you by your health care provider. Make sure you discuss any questions you have with your health care provider. Document Released: 02/15/2014 Document Revised: 09/19/2015 Document Reviewed: 09/19/2015 Elsevier Interactive Patient Education  2019 Elsevier Inc[employer]  

## 2018-03-19 LAB — BPAM RBC
Blood Product Expiration Date: 202003032359
Blood Product Expiration Date: 202003032359
ISSUE DATE / TIME: 202002080932
ISSUE DATE / TIME: 202002080932
Unit Type and Rh: 6200
Unit Type and Rh: 6200

## 2018-03-19 LAB — TYPE AND SCREEN
ABO/RH(D): A POS
Antibody Screen: NEGATIVE
Unit division: 0
Unit division: 0

## 2018-03-19 LAB — ABO/RH: ABO/RH(D): A POS

## 2018-03-20 ENCOUNTER — Ambulatory Visit (HOSPITAL_COMMUNITY)
Admission: RE | Admit: 2018-03-20 | Discharge: 2018-03-20 | Disposition: A | Discharge status: Home / Self Care | Payer: BC Managed Care – PPO | Source: Ambulatory Visit | Attending: Radiation Oncology | Admitting: Radiation Oncology

## 2018-03-20 ENCOUNTER — Ambulatory Visit
Admission: RE | Admit: 2018-03-20 | Discharge: 2018-03-20 | Disposition: A | Discharge status: Home / Self Care | Payer: BC Managed Care – PPO | Source: Ambulatory Visit | Attending: Radiation Oncology | Admitting: Radiation Oncology

## 2018-03-20 ENCOUNTER — Encounter (HOSPITAL_BASED_OUTPATIENT_CLINIC_OR_DEPARTMENT_OTHER): Payer: Self-pay

## 2018-03-20 ENCOUNTER — Ambulatory Visit (HOSPITAL_BASED_OUTPATIENT_CLINIC_OR_DEPARTMENT_OTHER): Payer: BC Managed Care – PPO | Admitting: Anesthesiology

## 2018-03-20 ENCOUNTER — Encounter (HOSPITAL_BASED_OUTPATIENT_CLINIC_OR_DEPARTMENT_OTHER)
Admission: RE | Disposition: A | Discharge status: Other Acute Inpatient Hospital | Payer: Self-pay | Source: Home / Self Care | Attending: Radiation Oncology

## 2018-03-20 ENCOUNTER — Ambulatory Visit (HOSPITAL_BASED_OUTPATIENT_CLINIC_OR_DEPARTMENT_OTHER)
Admission: RE | Admit: 2018-03-20 | Discharge: 2018-03-20 | Disposition: A | Discharge status: Other Acute Inpatient Hospital | Payer: BC Managed Care – PPO | Attending: Radiation Oncology | Admitting: Radiation Oncology

## 2018-03-20 ENCOUNTER — Other Ambulatory Visit: Payer: Self-pay

## 2018-03-20 DIAGNOSIS — C539 Malignant neoplasm of cervix uteri, unspecified: Secondary | ICD-10-CM

## 2018-03-20 DIAGNOSIS — Z888 Allergy status to other drugs, medicaments and biological substances status: Secondary | ICD-10-CM | POA: Diagnosis not present

## 2018-03-20 DIAGNOSIS — Z87891 Personal history of nicotine dependence: Secondary | ICD-10-CM | POA: Diagnosis not present

## 2018-03-20 DIAGNOSIS — Z881 Allergy status to other antibiotic agents status: Secondary | ICD-10-CM | POA: Insufficient documentation

## 2018-03-20 DIAGNOSIS — Z8249 Family history of ischemic heart disease and other diseases of the circulatory system: Secondary | ICD-10-CM | POA: Insufficient documentation

## 2018-03-20 DIAGNOSIS — Z79899 Other long term (current) drug therapy: Secondary | ICD-10-CM | POA: Insufficient documentation

## 2018-03-20 DIAGNOSIS — I10 Essential (primary) hypertension: Secondary | ICD-10-CM | POA: Diagnosis not present

## 2018-03-20 DIAGNOSIS — E119 Type 2 diabetes mellitus without complications: Secondary | ICD-10-CM | POA: Insufficient documentation

## 2018-03-20 DIAGNOSIS — K5900 Constipation, unspecified: Secondary | ICD-10-CM | POA: Insufficient documentation

## 2018-03-20 DIAGNOSIS — E785 Hyperlipidemia, unspecified: Secondary | ICD-10-CM | POA: Insufficient documentation

## 2018-03-20 DIAGNOSIS — C530 Malignant neoplasm of endocervix: Secondary | ICD-10-CM

## 2018-03-20 DIAGNOSIS — C7951 Secondary malignant neoplasm of bone: Secondary | ICD-10-CM | POA: Diagnosis not present

## 2018-03-20 HISTORY — DX: Unspecified hemorrhoids: K64.9

## 2018-03-20 HISTORY — PX: TANDEM RING INSERTION: SHX6199

## 2018-03-20 HISTORY — DX: Benign neoplasm of left adrenal gland: D35.02

## 2018-03-20 HISTORY — DX: Nontoxic multinodular goiter: E04.2

## 2018-03-20 HISTORY — DX: Cardiomegaly: I51.7

## 2018-03-20 HISTORY — DX: Heart disease, unspecified: I51.9

## 2018-03-20 HISTORY — DX: Personal history of other diseases of the circulatory system: Z86.79

## 2018-03-20 HISTORY — DX: Diverticulosis of intestine, part unspecified, without perforation or abscess without bleeding: K57.90

## 2018-03-20 HISTORY — PX: OPERATIVE ULTRASOUND: SHX5996

## 2018-03-20 HISTORY — DX: Malignant neoplasm of cervix uteri, unspecified: C53.9

## 2018-03-20 LAB — GLUCOSE, CAPILLARY: Glucose-Capillary: 103 mg/dL — ABNORMAL HIGH (ref 70–99)

## 2018-03-20 SURGERY — INSERTION, UTERINE TANDEM AND RING OR CYLINDER, FOR BRACHYTHERAPY
Anesthesia: General | Site: Vagina

## 2018-03-20 MED ORDER — OXYCODONE HCL 5 MG PO TABS
5.0000 mg | ORAL_TABLET | Freq: Once | ORAL | Status: DC | PRN
  Filled 2018-03-20: qty 1

## 2018-03-20 MED ORDER — LACTATED RINGERS IV SOLN
INTRAVENOUS | Status: DC
  Administered 2018-03-20: 50 mL/h via INTRAVENOUS
  Filled 2018-03-20: qty 1000

## 2018-03-20 MED ORDER — SCOPOLAMINE 1 MG/3DAYS TD PT72
MEDICATED_PATCH | TRANSDERMAL | Status: DC | PRN
  Administered 2018-03-20: 1 via TRANSDERMAL

## 2018-03-20 MED ORDER — FENTANYL CITRATE (PF) 100 MCG/2ML IJ SOLN
INTRAMUSCULAR | Status: DC | PRN
  Administered 2018-03-20: 25 ug via INTRAVENOUS
  Administered 2018-03-20: 50 ug via INTRAVENOUS
  Administered 2018-03-20: 25 ug via INTRAVENOUS

## 2018-03-20 MED ORDER — PROPOFOL 10 MG/ML IV BOLUS
INTRAVENOUS | Status: AC
  Filled 2018-03-20: qty 20

## 2018-03-20 MED ORDER — LACTATED RINGERS IV SOLN
INTRAVENOUS | Status: DC
  Administered 2018-03-20: 13:00:00 via INTRAVENOUS
  Filled 2018-03-20 (×2): qty 250

## 2018-03-20 MED ORDER — KETOROLAC TROMETHAMINE 30 MG/ML IJ SOLN
INTRAMUSCULAR | Status: AC
  Filled 2018-03-20: qty 1

## 2018-03-20 MED ORDER — DEXAMETHASONE SODIUM PHOSPHATE 10 MG/ML IJ SOLN
INTRAMUSCULAR | Status: AC
  Filled 2018-03-20: qty 1

## 2018-03-20 MED ORDER — FENTANYL CITRATE (PF) 100 MCG/2ML IJ SOLN
INTRAMUSCULAR | Status: AC
  Filled 2018-03-20: qty 2

## 2018-03-20 MED ORDER — SCOPOLAMINE 1 MG/3DAYS TD PT72
MEDICATED_PATCH | TRANSDERMAL | Status: AC
  Filled 2018-03-20: qty 1

## 2018-03-20 MED ORDER — EPHEDRINE SULFATE-NACL 50-0.9 MG/10ML-% IV SOSY
PREFILLED_SYRINGE | INTRAVENOUS | Status: DC | PRN
  Administered 2018-03-20 (×2): 10 mg via INTRAVENOUS

## 2018-03-20 MED ORDER — METOCLOPRAMIDE HCL 5 MG/ML IJ SOLN
INTRAMUSCULAR | Status: AC
  Filled 2018-03-20: qty 2

## 2018-03-20 MED ORDER — ACETAMINOPHEN 10 MG/ML IV SOLN
INTRAVENOUS | Status: DC | PRN
  Administered 2018-03-20: 1000 mg via INTRAVENOUS

## 2018-03-20 MED ORDER — MEPERIDINE HCL 25 MG/ML IJ SOLN
6.2500 mg | INTRAMUSCULAR | Status: DC | PRN
  Filled 2018-03-20: qty 1

## 2018-03-20 MED ORDER — PROPOFOL 10 MG/ML IV BOLUS
INTRAVENOUS | Status: DC | PRN
  Administered 2018-03-20: 150 mg via INTRAVENOUS

## 2018-03-20 MED ORDER — ACETAMINOPHEN 10 MG/ML IV SOLN
INTRAVENOUS | Status: AC
  Filled 2018-03-20: qty 100

## 2018-03-20 MED ORDER — METOCLOPRAMIDE HCL 5 MG/ML IJ SOLN
INTRAMUSCULAR | Status: DC | PRN
  Administered 2018-03-20 (×2): 5 mg via INTRAVENOUS

## 2018-03-20 MED ORDER — KETOROLAC TROMETHAMINE 30 MG/ML IJ SOLN
INTRAMUSCULAR | Status: DC | PRN
  Administered 2018-03-20: 30 mg via INTRAVENOUS

## 2018-03-20 MED ORDER — PROMETHAZINE HCL 25 MG/ML IJ SOLN
6.2500 mg | INTRAMUSCULAR | Status: DC | PRN
  Filled 2018-03-20: qty 1

## 2018-03-20 MED ORDER — MIDAZOLAM HCL 5 MG/5ML IJ SOLN
INTRAMUSCULAR | Status: DC | PRN
  Administered 2018-03-20: 2 mg via INTRAVENOUS

## 2018-03-20 MED ORDER — OXYCODONE HCL 5 MG/5ML PO SOLN
5.0000 mg | Freq: Once | ORAL | Status: DC | PRN
  Filled 2018-03-20: qty 5

## 2018-03-20 MED ORDER — ONDANSETRON HCL 4 MG/2ML IJ SOLN
INTRAMUSCULAR | Status: AC
  Filled 2018-03-20: qty 2

## 2018-03-20 MED ORDER — DEXAMETHASONE SODIUM PHOSPHATE 4 MG/ML IJ SOLN
INTRAMUSCULAR | Status: DC | PRN
  Administered 2018-03-20: 8 mg via INTRAVENOUS

## 2018-03-20 MED ORDER — MIDAZOLAM HCL 2 MG/2ML IJ SOLN
INTRAMUSCULAR | Status: AC
  Filled 2018-03-20: qty 2

## 2018-03-20 MED ORDER — SODIUM CHLORIDE 0.9 % IR SOLN
Status: DC | PRN
  Administered 2018-03-20: 200 mL

## 2018-03-20 MED ORDER — HYDROMORPHONE HCL 1 MG/ML IJ SOLN
0.2500 mg | INTRAMUSCULAR | Status: DC | PRN
  Administered 2018-03-20 (×2): 0.5 mg via INTRAVENOUS
  Filled 2018-03-20: qty 0.5

## 2018-03-20 MED ORDER — EPHEDRINE 5 MG/ML INJ
INTRAVENOUS | Status: AC
  Filled 2018-03-20: qty 10

## 2018-03-20 MED ORDER — HYDROMORPHONE HCL 1 MG/ML IJ SOLN
INTRAMUSCULAR | Status: AC
  Filled 2018-03-20: qty 1

## 2018-03-20 MED ORDER — LIDOCAINE 2% (20 MG/ML) 5 ML SYRINGE
INTRAMUSCULAR | Status: AC
  Filled 2018-03-20: qty 5

## 2018-03-20 MED ORDER — LIDOCAINE 2% (20 MG/ML) 5 ML SYRINGE
INTRAMUSCULAR | Status: DC | PRN
  Administered 2018-03-20: 100 mg via INTRAVENOUS

## 2018-03-20 MED ORDER — ONDANSETRON HCL 4 MG/2ML IJ SOLN
INTRAMUSCULAR | Status: DC | PRN
  Administered 2018-03-20: 4 mg via INTRAVENOUS

## 2018-03-20 SURGICAL SUPPLY — 26 items
BAG URINE DRAINAGE (UROLOGICAL SUPPLIES) ×4 IMPLANT
BNDG CONFORM 2 STRL LF (GAUZE/BANDAGES/DRESSINGS) IMPLANT
CATH FOLEY 2WAY SLVR  5CC 16FR (CATHETERS)
CATH FOLEY 2WAY SLVR 5CC 16FR (CATHETERS) ×2 IMPLANT
COVER WAND RF STERILE (DRAPES) ×4 IMPLANT
DILATOR CANAL MILEX (MISCELLANEOUS) IMPLANT
GAUZE 4X4 16PLY RFD (DISPOSABLE) ×4 IMPLANT
GLOVE BIO SURGEON STRL SZ 6.5 (GLOVE) ×1 IMPLANT
GLOVE BIO SURGEON STRL SZ7.5 (GLOVE) ×8 IMPLANT
GLOVE BIO SURGEONS STRL SZ 6.5 (GLOVE) ×1
GLOVE BIOGEL PI IND STRL 6.5 (GLOVE) IMPLANT
GLOVE BIOGEL PI INDICATOR 6.5 (GLOVE) ×2
GOWN STRL REUS W/TWL LRG LVL3 (GOWN DISPOSABLE) ×6 IMPLANT
HOLDER FOLEY CATH W/STRAP (MISCELLANEOUS) ×4 IMPLANT
HOVERMATT SINGLE USE (MISCELLANEOUS) ×4 IMPLANT
IV NS 1000ML (IV SOLUTION) ×4
IV NS 1000ML BAXH (IV SOLUTION) ×2 IMPLANT
KIT TURNOVER CYSTO (KITS) ×4 IMPLANT
PACK VAGINAL MINOR WOMEN LF (CUSTOM PROCEDURE TRAY) ×4 IMPLANT
PACKING VAGINAL (PACKING) IMPLANT
PAD ABD 8X10 STRL (GAUZE/BANDAGES/DRESSINGS) ×4 IMPLANT
PAD OB MATERNITY 4.3X12.25 (PERSONAL CARE ITEMS) IMPLANT
SET IRRIG Y TYPE TUR BLADDER L (SET/KITS/TRAYS/PACK) ×4 IMPLANT
TOWEL OR 17X26 10 PK STRL BLUE (TOWEL DISPOSABLE) ×6 IMPLANT
TRAY FOLEY W/BAG SLVR 14FR (SET/KITS/TRAYS/PACK) ×2 IMPLANT
WATER STERILE IRR 500ML POUR (IV SOLUTION) ×4 IMPLANT

## 2018-03-20 NOTE — Interval H&P Note (Signed)
History and Physical Interval Note:  03/20/2018 10:30 AM  Toni Peterson  has presented today for surgery, with the diagnosis of cervical cancer  The various methods of treatment have been discussed with the patient and family. After consideration of risks, benefits and other options for treatment, the patient has consented to  Procedure(s): TANDEM RING INSERTION (N/A) OPERATIVE ULTRASOUND (N/A) as a surgical intervention .  The patient's history has been reviewed, patient examined, no change in status, stable for surgery.  I have reviewed the patient's chart and labs.  Questions were answered to the patient's satisfaction.     Gery Pray

## 2018-03-20 NOTE — Discharge Instructions (Signed)

## 2018-03-20 NOTE — Progress Notes (Signed)
1225: pt received from [facility] OP PACU. Pt awake and aware of surroundings. Transported to [facility]. Loma Sousa, RN BSN   [phone]: received from [place]. IVF infusing without difficulty via PIV. Foley patent and draining light yellow urine. Pt awake and aware of surroundings. Pt taking PO without difficulty. Loma Sousa, RN BSN   [phone]: Foley catheter removed without difficulty, pt tolerated well. PIV removed, catheter tip intact, pt tolerated well. Tandem and Ring device removed without difficulty. Pt tolerated well. Pt was assisted with cleaning and dressing by this RN. Pt was discharged via [facility] to family member/personal vehicle. Pt aware of all discharge instructions.  Loma Sousa, RN BSN

## 2018-03-20 NOTE — Progress Notes (Signed)
ll Radiation Oncology         [phone] ________________________________  Name: Toni Peterson MRN: [id number]  Date: 03/20/2018  DOB: 1950/05/04  SIMULATION AND TREATMENT PLANNING NOTE HDR BRACHYTHERAPY  DIAGNOSIS:  Cervical cancer  NARRATIVE:  The patient was brought to the [place] suite.  Identity was confirmed.  All relevant records and images related to the planned course of therapy were reviewed.  The patient freely provided informed written consent to proceed with treatment after reviewing the details related to the planned course of therapy. The consent form was witnessed and verified by the simulation staff.  Then, the patient was set-up in a stable reproducible  supine position for radiation therapy.  CT images were obtained.  Surface markings were placed.  The CT images were loaded into the planning software.  Then the target and avoidance structures were contoured.  Treatment planning then occurred.  The radiation prescription was entered and confirmed.   I have requested : Brachytherapy Isodose Plan and Dosimetry Calculations to plan the radiation distribution.    PLAN:  The patient will receive 5.5 Gy in 1 fraction.  Patient will be treated with the tandem ring system using iridium 192 as the high-dose-rate source. ________________________________  Blair Promise, PhD, MD   This document serves as a record of services personally performed by Gery Pray, MD. It was created on his behalf by Mary-Margaret Loma Messing, a trained medical scribe. The creation of this record is based on the scribe's personal observations and the provider's statements to them. This document has been checked and approved by the attending provider.

## 2018-03-20 NOTE — Progress Notes (Signed)
  Radiation Oncology         [phone] ________________________________  Name: Toni Peterson MRN: [id number]  Date: 03/20/2018  DOB: 08/20/1950  CC: Antony Contras, MD  Everitt Amber, MD  HDR BRACHYTHERAPY NOTE  DIAGNOSIS: 69 y.o. female with cervical cancer  NARRATIVE: The patient was brought to the [place] suite. Identity was confirmed. All relevant records and images related to the planned course of therapy were reviewed. The patient freely provided informed written consent to proceed with treatment after reviewing the details related to the planned course of therapy. The consent form was witnessed and verified by the simulation staff. Then, the patient was set-up in a stable reproducible supine position for radiation therapy. The tandem ring system was accessed and fiducial markers were placed within the tandem and ring.   Simple treatment device note: On the operating room the patient had construction of her custom tandem ring system. She will be treated with a 45 tandem/ring system. The patient had placement of a 60 mm tandem. A cervical ring with a small shielding was used for her treatment. A rectal paddle was also part of her custom set up device.  Verification simulation note: An AP and lateral film was obtained through the pelvis area. This was compared to the patient's planning films documenting accurate position of the tandem/ring system for treatment.  High-dose-rate brachytherapy treatment note:  The remote afterloading device was accessed through catheter system and attached to the tandem ring system. Patient then proceeded to undergo her fourth high-dose-rate treatment directed at the cervix. The patient was prescribed a dose of 5.5 gray to be delivered to the high-risk clinical target volume.. Patient was treated with 2 channels using 27 dwell positions. Treatment time was 446.7 seconds. The patient tolerated the procedure well. After completion of her therapy, a radiation  survey was performed documenting return of the iridium source into the GammaMed safe. The patient was then transferred to the nursing suite. She then had removal of the rectal paddle followed by the tandem and ring system. The patient tolerated the removal well.  PLAN: The patient will return on 03/27/2018 for her fifth and final HDR treatment. ________________________________  Blair Promise, PhD, MD  This document serves as a record of services personally performed by Gery Pray, MD. It was created on his behalf by Mary-Margaret Loma Messing, a trained medical scribe. The creation of this record is based on the scribe's personal observations and the provider's statements to them. This document has been checked and approved by the attending provider.

## 2018-03-20 NOTE — Anesthesia Preprocedure Evaluation (Signed)
Anesthesia Evaluation  Patient identified by MRN, date of birth, ID band Patient awake    Reviewed: Allergy & Precautions, NPO status , Patient's Chart, lab work & pertinent test results, reviewed documented beta blocker date and time   History of Anesthesia Complications (+) PONV  Airway Mallampati: I  TM Distance: >3 FB Neck ROM: Full    Dental  (+) Dental Advisory Given, Missing   Pulmonary former smoker,    breath sounds clear to auscultation       Cardiovascular hypertension, Pt. on medications and Pt. on home beta blockers (-) angina Rhythm:Regular Rate:Normal  11/19 ECHO: EF 60-65%, valves ok   Neuro/Psych negative neurological ROS     GI/Hepatic negative GI ROS, Neg liver ROS,   Endo/Other    Renal/GU negative Renal ROS     Musculoskeletal   Abdominal (+) + obese,   Peds  Hematology negative hematology ROS (+)   Anesthesia Other Findings Cervical Cancer  Reproductive/Obstetrics                             Anesthesia Physical  Anesthesia Plan  ASA: III  Anesthesia Plan: General   Post-op Pain Management:    Induction: Intravenous  PONV Risk Score and Plan: 4 or greater and Scopolamine patch - Pre-op, Dexamethasone, Ondansetron, Treatment may vary due to age or medical condition and Midazolam  Airway Management Planned: LMA  Additional Equipment:   Intra-op Plan:   Post-operative Plan: Extubation in OR  Informed Consent: I have reviewed the patients History and Physical, chart, labs and discussed the procedure including the risks, benefits and alternatives for the proposed anesthesia with the patient or authorized representative who has indicated his/her understanding and acceptance.     Dental advisory given  Plan Discussed with: CRNA and Surgeon  Anesthesia Plan Comments: (  )        Anesthesia Quick Evaluation

## 2018-03-20 NOTE — Anesthesia Postprocedure Evaluation (Signed)
Anesthesia Post Note  Patient: JANERA PEUGH  Procedure(s) Performed: TANDEM RING INSERTION (N/A Vagina ) OPERATIVE ULTRASOUND (N/A Abdomen)     Patient location during evaluation: PACU Anesthesia Type: General Level of consciousness: awake and alert Pain management: pain level controlled Vital Signs Assessment: post-procedure vital signs reviewed and stable Respiratory status: spontaneous breathing, nonlabored ventilation and respiratory function stable Cardiovascular status: blood pressure returned to baseline and stable Postop Assessment: no apparent nausea or vomiting Anesthetic complications: no    Last Vitals:  Vitals:   03/20/18 1200 03/20/18 1215  BP: 132/74 122/69  Pulse: 74 73  Resp: 12 15  Temp:    SpO2: 93% 92%    Last Pain:  Vitals:   03/20/18 1215  TempSrc:   PainSc: [place]

## 2018-03-20 NOTE — Anesthesia Procedure Notes (Signed)
Procedure Name: LMA Insertion Date/Time: 03/20/2018 10:39 AM Performed by: Lynda Rainwater, MD Pre-anesthesia Checklist: Patient identified, Emergency Drugs available, Suction available and Patient being monitored Patient Re-evaluated:Patient Re-evaluated prior to induction Oxygen Delivery Method: Circle system utilized Preoxygenation: Pre-oxygenation with 100% oxygen Induction Type: IV induction Ventilation: Mask ventilation without difficulty LMA: LMA inserted LMA Size: 4.0 Number of attempts: 1 Airway Equipment and Method: Bite block Placement Confirmation: positive ETCO2 Tube secured with: Tape Dental Injury: Teeth and Oropharynx as per pre-operative assessment

## 2018-03-20 NOTE — Transfer of Care (Signed)
Last Vitals:  Vitals Value Taken Time  BP 111/69 03/20/2018 11:17 AM  Temp    Pulse 80 03/20/2018 11:19 AM  Resp 19 03/20/2018 11:19 AM  SpO2 91 % 03/20/2018 11:19 AM  Vitals shown include unvalidated device data.  Last Pain:  Vitals:   03/20/18 0846  TempSrc: Oral  PainSc: 0-No pain      Patients Stated Pain Goal: 7 (03/20/18 0846) Immediate Anesthesia Transfer of Care Note  Patient: Toni Peterson  Procedure(s) Performed: Procedure(s) (LRB): TANDEM RING INSERTION (N/A) OPERATIVE ULTRASOUND (N/A)  Patient Location: PACU  Anesthesia Type: General  Level of Consciousness: awake, alert  and oriented  Airway & Oxygen Therapy: Patient Spontanous Breathing and Patient connected to nasal cannula  oxygen  Post-op Assessment: Report given to PACU RN and Post -op Vital signs reviewed and stable  Post vital signs: Reviewed and stable  Complications: No apparent anesthesia complications

## 2018-03-21 ENCOUNTER — Telehealth: Payer: Self-pay | Admitting: Hematology and Oncology

## 2018-03-21 ENCOUNTER — Ambulatory Visit: Payer: BC Managed Care – PPO | Admitting: Radiation Oncology

## 2018-03-21 ENCOUNTER — Encounter (HOSPITAL_BASED_OUTPATIENT_CLINIC_OR_DEPARTMENT_OTHER): Payer: Self-pay | Admitting: Radiation Oncology

## 2018-03-21 NOTE — Telephone Encounter (Signed)
Scheduled appt per 2/07 sch message - pt is aware of appt date and time

## 2018-03-22 NOTE — Op Note (Signed)
03/20/2018  7:32 PM  PATIENT:  Toni Peterson  68 y.o. female  PRE-OPERATIVE DIAGNOSIS:  cervical cancer  POST-OPERATIVE DIAGNOSIS:  cervical cancer  PROCEDURE:  Procedure(s): TANDEM RING INSERTION (N/A) OPERATIVE ULTRASOUND (N/A)  SURGEON:  Surgeon(s) and Role:    * Gery Pray, MD - Primary  PHYSICIAN ASSISTANT:   ASSISTANTS: none   ANESTHESIA:   general  EBL:  10 mL   BLOOD ADMINISTERED:none  DRAINS: Urinary Catheter (Foley)   LOCAL MEDICATIONS USED:  NONE  SPECIMEN:  No Specimen  DISPOSITION OF SPECIMEN:  N/A  COUNTS:  YES  TOURNIQUET:  * No tourniquets in log *  DICTATION: The patient was transported to the outpatient surgical center room #3. Patient was prepped and draped in the usual sterile fashion placed in the dorsal lithotomy position.A timeout for the procedure, estimated length of the procedure and preoperative medications with performed. Patient had a Foley catheter placed with ~ 200 mL of saline backfilled into the bladder for ultrasound imaging purposes. Exam under anesthesia revealed the cervical mass to be significantly decreased in size and softer on exam.  the cervical area estimated to be approximately2.5x 2.0 cm in size. No parametrial extension. Patient proceeded to undergo dilation of the cervical os and sounding of the uterus. The uterus was noted to be expanded and on ultrasound this appeared to be multiple fibroids. Once the cervical os was dilated serosanguineous fluid and some blood  drained from the uterus, much less compared to last week. On ultrasound the uterus sounded to approximately 8.5 cm. Patient proceeded to undergo dilation of the cervical os and then placement of a 60 mm cervical sleeve. Good ultrasound imaging was obtained. The uterus was noted to be in the anteverted position. Patient then had placement of a 60 mm 45 tandem within the cervical sleeve in the endocervical canal and midportion of the endometrial canal. Patient then  had a 45ring with smallshielding cap placed along the cervical region. This was then followed by placement of a rectal paddle posteriorly. Patient tolerated the procedure well. Later in the day the patient will be transported to the radiation oncology Department for planning and her fourth high-dose-rate treatment. The patient is planned to have a total of 5 high-dose rate treatments directed at the cervical region.  PLAN OF CARE: transferred to radiation oncology for planning and treatment  PATIENT DISPOSITION:  PACU - hemodynamically stable.   Delay start of Pharmacological VTE agent (>24hrs) due to surgical blood loss or risk of bleeding: not applicable

## 2018-03-24 ENCOUNTER — Encounter (HOSPITAL_BASED_OUTPATIENT_CLINIC_OR_DEPARTMENT_OTHER): Payer: Self-pay | Admitting: Anesthesiology

## 2018-03-24 ENCOUNTER — Inpatient Hospital Stay: Payer: BC Managed Care – PPO

## 2018-03-24 ENCOUNTER — Inpatient Hospital Stay (HOSPITAL_BASED_OUTPATIENT_CLINIC_OR_DEPARTMENT_OTHER): Payer: BC Managed Care – PPO | Admitting: Hematology and Oncology

## 2018-03-24 ENCOUNTER — Encounter: Payer: Self-pay | Admitting: Hematology and Oncology

## 2018-03-24 ENCOUNTER — Telehealth: Payer: Self-pay | Admitting: Hematology and Oncology

## 2018-03-24 DIAGNOSIS — C539 Malignant neoplasm of cervix uteri, unspecified: Secondary | ICD-10-CM

## 2018-03-24 DIAGNOSIS — C7951 Secondary malignant neoplasm of bone: Secondary | ICD-10-CM

## 2018-03-24 DIAGNOSIS — C530 Malignant neoplasm of endocervix: Secondary | ICD-10-CM

## 2018-03-24 DIAGNOSIS — D6481 Anemia due to antineoplastic chemotherapy: Secondary | ICD-10-CM

## 2018-03-24 DIAGNOSIS — T451X5A Adverse effect of antineoplastic and immunosuppressive drugs, initial encounter: Secondary | ICD-10-CM

## 2018-03-24 DIAGNOSIS — N3000 Acute cystitis without hematuria: Secondary | ICD-10-CM

## 2018-03-24 DIAGNOSIS — N390 Urinary tract infection, site not specified: Secondary | ICD-10-CM

## 2018-03-24 DIAGNOSIS — C183 Malignant neoplasm of hepatic flexure: Secondary | ICD-10-CM

## 2018-03-24 LAB — URINALYSIS, COMPLETE (UACMP) WITH MICROSCOPIC
Bilirubin Urine: NEGATIVE
Glucose, UA: NEGATIVE mg/dL
Ketones, ur: NEGATIVE mg/dL
Nitrite: NEGATIVE
Protein, ur: 100 mg/dL — AB
Specific Gravity, Urine: 1.014 (ref 1.005–1.030)
WBC, UA: >50 WBC/hpf — ABNORMAL HIGH (ref 0–5)
pH: 6 (ref 5.0–8.0)

## 2018-03-24 LAB — CBC WITH DIFFERENTIAL/PLATELET
Abs Immature Granulocytes: 0.19 10*3/uL — ABNORMAL HIGH (ref 0.00–0.07)
Basophils Absolute: 0 10*3/uL (ref 0.0–0.1)
Basophils Relative: 0 %
Eosinophils Absolute: 0 10*3/uL (ref 0.0–0.5)
Eosinophils Relative: 0 %
HCT: 33.1 % — ABNORMAL LOW (ref 36.0–46.0)
Hemoglobin: 10.8 g/dL — ABNORMAL LOW (ref 12.0–15.0)
Immature Granulocytes: 2 %
Lymphocytes Relative: 8 %
Lymphs Abs: 0.7 10*3/uL (ref 0.7–4.0)
MCH: 30.4 pg (ref 26.0–34.0)
MCHC: 32.6 g/dL (ref 30.0–36.0)
MCV: 93.2 fL (ref 80.0–100.0)
Monocytes Absolute: 0.9 10*3/uL (ref 0.1–1.0)
Monocytes Relative: 10 %
Neutro Abs: 7.2 10*3/uL (ref 1.7–7.7)
Neutrophils Relative %: 80 %
Platelets: 361 10*3/uL (ref 150–400)
RBC: 3.55 MIL/uL — AB (ref 3.87–5.11)
RDW: 19.5 % — ABNORMAL HIGH (ref 11.5–15.5)
WBC: 9.1 10*3/uL (ref 4.0–10.5)
nRBC: 0.7 % — ABNORMAL HIGH (ref 0.0–0.2)

## 2018-03-24 LAB — COMPREHENSIVE METABOLIC PANEL
ALT: 12 U/L (ref 0–44)
AST: 12 U/L — ABNORMAL LOW (ref 15–41)
Albumin: 3.1 g/dL — ABNORMAL LOW (ref 3.5–5.0)
Alkaline Phosphatase: 78 U/L (ref 38–126)
Anion gap: 11 (ref 5–15)
BUN: 17 mg/dL (ref 8–23)
CO2: 25 mmol/L (ref 22–32)
Calcium: 9.2 mg/dL (ref 8.9–10.3)
Chloride: 103 mmol/L (ref 98–111)
Creatinine, Ser: 0.82 mg/dL (ref 0.44–1.00)
GFR calc Af Amer: >60 mL/min (ref >60)
GFR calc non Af Amer: >60 mL/min (ref >60)
Glucose, Bld: 94 mg/dL (ref 70–99)
Potassium: 4 mmol/L (ref 3.5–5.1)
SODIUM: 139 mmol/L (ref 135–145)
Total Bilirubin: 0.3 mg/dL (ref 0.3–1.2)
Total Protein: 6.5 g/dL (ref 6.5–8.1)

## 2018-03-24 LAB — SAMPLE TO BLOOD BANK

## 2018-03-24 LAB — MAGNESIUM: Magnesium: 1.9 mg/dL (ref 1.7–2.4)

## 2018-03-24 MED ORDER — HEPARIN SOD (PORK) LOCK FLUSH 100 UNIT/ML IV SOLN
500.0000 [IU] | Freq: Once | INTRAVENOUS | Status: AC
  Administered 2018-03-24: 500 [IU]
  Filled 2018-03-24: qty 5

## 2018-03-24 MED ORDER — SODIUM CHLORIDE 0.9% FLUSH
10.0000 mL | Freq: Once | INTRAVENOUS | Status: AC
  Administered 2018-03-24: 10 mL
  Filled 2018-03-24: qty 10

## 2018-03-24 NOTE — Anesthesia Preprocedure Evaluation (Addendum)
Anesthesia Evaluation  Patient identified by MRN, date of birth, ID band Patient awake    Reviewed: Allergy & Precautions, NPO status , Patient's Chart, lab work & pertinent test results, reviewed documented beta blocker date and time   History of Anesthesia Complications (+) PONV and history of anesthetic complications  Airway Mallampati: I  TM Distance: >3 FB Neck ROM: Full    Dental  (+) Missing   Pulmonary former smoker,    Pulmonary exam normal breath sounds clear to auscultation       Cardiovascular hypertension, Pt. on medications and Pt. on home beta blockers Normal cardiovascular exam Rhythm:Regular Rate:Normal     Neuro/Psych negative neurological ROS  negative psych ROS   GI/Hepatic Neg liver ROS, Colon Ca   Endo/Other  diabetes, Well Controlled, Type 2Obesity Hyperlipidemia  Renal/GU negative Renal ROS  negative genitourinary   Musculoskeletal Bone Metastasis   Abdominal (+) + obese,   Peds  Hematology  (+) anemia ,   Anesthesia Other Findings   Reproductive/Obstetrics Cervical Ca                            Anesthesia Physical Anesthesia Plan  ASA: III  Anesthesia Plan: General   Post-op Pain Management:    Induction: Intravenous  PONV Risk Score and Plan: 4 or greater and Scopolamine patch - Pre-op, Midazolam, Ondansetron, Dexamethasone and Treatment may vary due to age or medical condition  Airway Management Planned: LMA  Additional Equipment:   Intra-op Plan:   Post-operative Plan: Extubation in OR  Informed Consent: I have reviewed the patients History and Physical, chart, labs and discussed the procedure including the risks, benefits and alternatives for the proposed anesthesia with the patient or authorized representative who has indicated his/her understanding and acceptance.     Dental advisory given  Plan Discussed with: CRNA and  Surgeon  Anesthesia Plan Comments:        Anesthesia Quick Evaluation

## 2018-03-24 NOTE — Assessment & Plan Note (Signed)
She has no bowel obstructive symptoms.  Will repeat imaging study to evaluate

## 2018-03-24 NOTE — Assessment & Plan Note (Signed)
She denies bone pain.  We will plan to repeat imaging study

## 2018-03-24 NOTE — Progress Notes (Signed)
[place] OFFICE PROGRESS NOTE  Patient Care Team: Antony Contras, MD as PCP - General (Family Medicine) Minus Breeding, MD as PCP - Cardiology (Cardiology)  ASSESSMENT & PLAN:  Cervical cancer [facility]) She is recovering well from chemotherapy but still have significant symptoms of cystitis.  I recommend urinalysis and urine culture to exclude UTI Due to her advanced age disease, instead of waiting 3 months, I would like to repeat PET CT scan within 6 weeks of completion of radiation treatment.  Colon cancer [facility]) She has no bowel obstructive symptoms.  Will repeat imaging study to evaluate   Anemia due to antineoplastic chemotherapy She has received blood transfusion recently.  Her blood count is stable  Metastasis to bone [facility]) She denies bone pain.  We will plan to repeat imaging study  UTI (urinary tract infection) She has severe symptoms of cystitis She was treated with multiple courses of antibiotics without resolution I recommend repeat urinalysis and urine culture   Orders Placed This Encounter  Procedures  . Urine Culture    Standing Status:   Future    Number of Occurrences:   1    Standing Expiration Date:   04/28/2019  . NM PET Image Restag (PS) Skull Base To Thigh    Standing Status:   Future    Standing Expiration Date:   03/25/2019    Order Specific Question:   If indicated for the ordered procedure, I authorize the administration of a radiopharmaceutical per Radiology protocol    Answer:   Yes    Order Specific Question:   Preferred imaging location?    Answer:   [facility]    Order Specific Question:   Radiology Contrast Protocol - do NOT remove file path    Answer:   _0 [email or web address]  . Urinalysis, Complete w Microscopic    Standing Status:   Future    Number of Occurrences:   1    Standing Expiration Date:   03/25/2019    INTERVAL HISTORY: Please see below for problem oriented charting. She returns  for further follow-up She complained of severe cystitis symptoms with urinary frequency and dysuria Denies hematuria She denies bone pain No recent nausea, vomiting or changes in bowel habits After blood transfusion, she felt a bit better The patient denies any recent signs or symptoms of bleeding such as spontaneous epistaxis, hematuria or hematochezia.   SUMMARY OF ONCOLOGIC HISTORY: Oncology History   MSI - Stable on cervix biopsy PD-L1 2%  Bone biopsy is consistent with metastatic GYN primary MMR protein MLH1 and PMS2 abnormalities noted on colon biopsy     Cervical cancer ([place])   09/08/2017 Initial Diagnosis    Patient has a history of symptomatic endometrial fibroids in 2013 for which she underwent hysteroscopic resection with benign pathology.  She has had lifelong normal Pap smears including in 2019.  Of note HPV testing was not performed in this Pap in 2019.  She developed symptoms of very light vaginal spotting in August 2019     11/22/2017 Procedure    She was seen by her gynecologist, Dr. Radene Knee, who performed a pelvic examination.  On physical examination and endocervical mass was appreciated and felt to be likely to be a polyp or fibroid was biopsied on 11/22/17.       11/22/2017 Pathology Results    This revealed poorly differentiated invasive carcinoma.  Immunostains revealed that it was CK7 positive, P 16+, p53 positive, PAX 8+ and CK 5 6 weakly positive.  CK 20, ER PR and p53 immunostains were negative.  The morphology and Immuno profile favored a gynecologic primary with endocervical and endometrial adenocarcinoma in the differential diagnosis.     11/22/2017 Imaging    An ultrasound scan on November 22, 2017 revealed several intramural fibroids.  With saline infusion there was a 1.8 cm density within the endometrial cavity which could be a fibroid or polyp.     12/08/2017 Imaging    PET:  1. Marked hypermetabolism involving the cervix and uterus. Difficult to be  certain of the origin but favor cervix. No evidence of parametrial or parauterine tumor and no pelvic or retroperitoneal hypermetabolic lymphadenopathy. 2. L5 vertebral body lesion worrisome for metastasis. 3. Hypermetabolic area involving the hepatic flexure region of the colon is worrisome for colon cancer. Recommend colonoscopy. 4. Benign left adrenal gland adenoma. 5. Symmetric hypermetabolism in the tongue base and tonsillar regions is likely inflamed lymphoid tissue. No neck mass or adenopathy.    12/19/2017 Imaging    1. Diffuse marrow replacement in the L5 vertebral body, indeterminate though metastatic disease is a concern. Correlate with upcoming biopsy. No extraosseous tumor or pathologic fracture. 2. Marrow signal abnormality on both sides of the T11-12 disc space favored to be degenerative. 3. Severe L4-5 facet arthrosis with grade 1 anterolisthesis, mild bilateral lateral recess stenosis, and mild-to-moderate bilateral neural foraminal stenosis. Findings may worsen with standing. 4. Moderate bilateral neural foraminal stenosis at L5-S1. 5. Partially visualized uterine masses which may reflect a combination of known adenocarcinoma and fibroids. 4 cm right adnexal mass may reflect an exophytic subserosal fibroid or ovarian neoplasm.    12/20/2017 Echocardiogram    LV EF: 60% -   65%    12/23/2017 Procedure    Successful placement of a right IJ approach Power Port with ultrasound and fluoroscopic guidance. The catheter is ready for use.    12/23/2017 Pathology Results    Bone, biopsy, L5 - METASTATIC POORLY DIFFERENTIATED CARCINOMA. - SEE COMMENT. Microscopic Comment Dr. Vic Ripper has reviewed the case and concurs with this interpretation. The case was discussed wtih Joylene John on 12/22/2017. Per request, a block will be sent for PDL1 testing and the results reported separately    01/11/2018 Cancer Staging    Staging form: Cervix Uteri, AJCC 8th Edition - Clinical: FIGO Stage  IVB (cT2, cN0, pM1) - Signed by Heath Lark, MD on 01/11/2018    01/17/2018 -  Chemotherapy    The patient had weekly cisplatin     Colon cancer ([place])   01/11/2018 Procedure    - Hemorrhoids found on perianal exam. - Malignant partially obstructing tumor at the hepatic flexure. Biopsied. Tattooed. - Two 2 to 5 mm polyps in the sigmoid colon, removed with a cold snare. Resected and retrieved. - Diverticulosis in the sigmoid colon, in the distal descending colon and in the ascending colon. - Non-bleeding internal hemorrhoids. - The examined portion of the ileum was normal.    01/11/2018 Pathology Results    1. Colon, biopsy, hepatic flexure - ADENOCARCINOMA, SEE COMMENT. 2. Colon, polyp(s), sigmoid, polyp (2) - HYPERPLASTIC POLYP (X2 FRAGMENTS). - NO DYSPLASIA OR MALIGNANCY. Microscopic Comment 1. There are additional fragments of tubular adenoma suggesting this is a primary colon adenocarcinoma.     02/07/2018 Cancer Staging    Staging form: Colon and Rectum, AJCC 8th Edition - Clinical: Stage I (cT1, cN0, cM0) - Signed by Heath Lark, MD on 02/07/2018     REVIEW OF SYSTEMS:   Constitutional: Denies fevers,  chills or abnormal weight loss Eyes: Denies blurriness of vision Ears, nose, mouth, throat, and face: Denies mucositis or sore throat Respiratory: Denies cough, dyspnea or wheezes Cardiovascular: Denies palpitation, chest discomfort or lower extremity swelling Gastrointestinal:  Denies nausea, heartburn or change in bowel habits Skin: Denies abnormal skin rashes Lymphatics: Denies new lymphadenopathy or easy bruising Neurological:Denies numbness, tingling or new weaknesses Behavioral/Psych: Mood is stable, no new changes  All other systems were reviewed with the patient and are negative.  I have reviewed the past medical history, past surgical history, social history and family history with the patient and they are unchanged from previous note.  ALLERGIES:  has No Known  Allergies.  MEDICATIONS:  Current Outpatient Medications  Medication Sig Dispense Refill  . amLODipine (NORVASC) 10 MG tablet Take 10 mg by mouth every evening.     Marland Kitchen atenolol (TENORMIN) 50 MG tablet Take 50 mg by mouth every evening.   1  . cephALEXin (KEFLEX) 500 MG capsule Take 500 mg by mouth 2 (two) times daily.    . Cholecalciferol (VITAMIN D3) 5000 units CAPS Take 5,000 Units by mouth daily.     . fish oil-omega-3 fatty acids 1000 MG capsule Take 1 g by mouth daily.     . fluconazole (DIFLUCAN) 150 MG tablet Take 150 mg by mouth daily.    . hydrocortisone (ANUSOL-HC) 2.5 % rectal cream Apply 1 application topically 2 (two) times daily. 28.35 g 0  . lidocaine-prilocaine (EMLA) cream Apply to affected area once 30 g 3  . LORazepam (ATIVAN) 0.5 MG tablet Take 1 tablet (0.5 mg total) by mouth every 6 (six) hours as needed (Nausea or vomiting). 30 tablet 0  . Multiple Vitamin (MULTIVITAMIN WITH MINERALS) TABS tablet Take 1 tablet by mouth daily.    . ondansetron (ZOFRAN) 8 MG tablet Take 1 tablet (8 mg total) by mouth 2 (two) times daily as needed. Start on the third day after chemotherapy. 30 tablet 1  . phenazopyridine (PYRIDIUM) 200 MG tablet Take 1 tablet (200 mg total) by mouth 3 (three) times daily as needed for pain. 25 tablet 0  . pramoxine (PROCTOFOAM) 1 % foam Place 1 application rectally 3 (three) times daily as needed for anal itching. 15 g 2  . prochlorperazine (COMPAZINE) 10 MG tablet Take 1 tablet (10 mg total) by mouth every 6 (six) hours as needed (Nausea or vomiting). 30 tablet 1  . spironolactone (ALDACTONE) 25 MG tablet Take 1 tablet (25 mg total) by mouth daily. 90 tablet 3   No current facility-administered medications for this visit.     PHYSICAL EXAMINATION: ECOG PERFORMANCE STATUS: 1 - Symptomatic but completely ambulatory  Vitals:   03/24/18 1415  BP: (!) 163/83  Pulse: 84  Resp: 19  Temp: 98.7 F (37.1 C)  SpO2: 100%   Filed Weights   03/24/18 1415   Weight: 178 lb 12.8 oz (81.1 kg)    GENERAL:alert, no distress and comfortable Musculoskeletal:no cyanosis of digits and no clubbing  NEURO: alert & oriented x 3 with fluent speech, no focal motor/sensory deficits  LABORATORY DATA:  I have reviewed the data as listed    Component Value Date/Time   NA 138 03/17/2018 1029   K 4.1 03/17/2018 1029   CL 103 03/17/2018 1029   CO2 26 03/17/2018 1029   GLUCOSE 104 (H) 03/17/2018 1029   BUN 16 03/17/2018 1029   CREATININE 0.72 03/17/2018 1029   CREATININE 0.69 01/10/2018 1023   CALCIUM 9.3 03/17/2018 1029  PROT 6.2 (L) 03/17/2018 1029   ALBUMIN 2.9 (L) 03/17/2018 1029   AST 14 (L) 03/17/2018 1029   AST 12 (L) 12/28/2017 1333   ALT 14 03/17/2018 1029   ALT 13 12/28/2017 1333   ALKPHOS 76 03/17/2018 1029   BILITOT 0.5 03/17/2018 1029   BILITOT 0.4 12/28/2017 1333   GFRNONAA >60 03/17/2018 1029   GFRNONAA >60 01/10/2018 1023   GFRAA >60 03/17/2018 1029   GFRAA >60 01/10/2018 1023    No results found for: SPEP, UPEP  Lab Results  Component Value Date   WBC 9.1 03/24/2018   NEUTROABS 7.2 03/24/2018   HGB 10.8 (L) 03/24/2018   HCT 33.1 (L) 03/24/2018   MCV 93.2 03/24/2018   PLT 361 03/24/2018      Chemistry      Component Value Date/Time   NA 138 03/17/2018 1029   K 4.1 03/17/2018 1029   CL 103 03/17/2018 1029   CO2 26 03/17/2018 1029   BUN 16 03/17/2018 1029   CREATININE 0.72 03/17/2018 1029   CREATININE 0.69 01/10/2018 1023      Component Value Date/Time   CALCIUM 9.3 03/17/2018 1029   ALKPHOS 76 03/17/2018 1029   AST 14 (L) 03/17/2018 1029   AST 12 (L) 12/28/2017 1333   ALT 14 03/17/2018 1029   ALT 13 12/28/2017 1333   BILITOT 0.5 03/17/2018 1029   BILITOT 0.4 12/28/2017 1333       RADIOGRAPHIC STUDIES: I have personally reviewed the radiological images as listed and agreed with the findings in the report. [place] Intraoperative  Result Date: 03/20/2018 CLINICAL DATA:  Ultrasound was provided for use by  the ordering physician, and a technical charge was applied by the performing facility.  No radiologist interpretation/professional services rendered.   [place] Intraoperative  Result Date: 03/16/2018 CLINICAL DATA:  Ultrasound was provided for use by the ordering physician, and a technical charge was applied by the performing facility.  No radiologist interpretation/professional services rendered.   [place] Intraoperative  Result Date: 03/07/2018 CLINICAL DATA:  Ultrasound was provided for use by the ordering physician, and a technical charge was applied by the performing facility.  No radiologist interpretation/professional services rendered.   [place] Intraoperative  Result Date: 02/28/2018 CLINICAL DATA:  Ultrasound was provided for use by the ordering physician, and a technical charge was applied by the performing facility.  No radiologist interpretation/professional services rendered.    All questions were answered. The patient knows to call the clinic with any problems, questions or concerns. No barriers to learning was detected.  I spent 25 minutes counseling the patient face to face. The total time spent in the appointment was 30 minutes and more than 50% was on counseling and review of test results  Heath Lark, MD 03/24/2018 3:22 PM

## 2018-03-24 NOTE — Telephone Encounter (Signed)
Gave avs and calendar ° °

## 2018-03-24 NOTE — Assessment & Plan Note (Signed)
She has received blood transfusion recently.  Her blood count is stable

## 2018-03-24 NOTE — Assessment & Plan Note (Signed)
She has severe symptoms of cystitis She was treated with multiple courses of antibiotics without resolution I recommend repeat urinalysis and urine culture

## 2018-03-24 NOTE — Progress Notes (Addendum)
Left patient message npo after midnight arrive 530 am 03-27-2008 [facility], may take zofran and ativan sip of water  if needed. Asked patient to call back and confirm message received

## 2018-03-24 NOTE — Assessment & Plan Note (Signed)
She is recovering well from chemotherapy but still have significant symptoms of cystitis.  I recommend urinalysis and urine culture to exclude UTI Due to her advanced age disease, instead of waiting 3 months, I would like to repeat PET CT scan within 6 weeks of completion of radiation treatment.

## 2018-03-25 LAB — URINE CULTURE: Culture: NO GROWTH

## 2018-03-26 ENCOUNTER — Other Ambulatory Visit (HOSPITAL_COMMUNITY): Payer: Self-pay | Admitting: Radiation Oncology

## 2018-03-26 DIAGNOSIS — C539 Malignant neoplasm of cervix uteri, unspecified: Secondary | ICD-10-CM

## 2018-03-27 ENCOUNTER — Ambulatory Visit
Admission: RE | Admit: 2018-03-27 | Discharge: 2018-03-27 | Disposition: A | Discharge status: Home / Self Care | Payer: BC Managed Care – PPO | Source: Ambulatory Visit | Attending: Radiation Oncology | Admitting: Radiation Oncology

## 2018-03-27 ENCOUNTER — Ambulatory Visit (HOSPITAL_BASED_OUTPATIENT_CLINIC_OR_DEPARTMENT_OTHER): Payer: BC Managed Care – PPO | Admitting: Anesthesiology

## 2018-03-27 ENCOUNTER — Encounter (HOSPITAL_BASED_OUTPATIENT_CLINIC_OR_DEPARTMENT_OTHER)
Admission: RE | Disposition: A | Discharge status: Home / Self Care | Payer: Self-pay | Source: Ambulatory Visit | Attending: Radiation Oncology

## 2018-03-27 ENCOUNTER — Ambulatory Visit (HOSPITAL_COMMUNITY)
Admission: RE | Admit: 2018-03-27 | Discharge: 2018-03-27 | Disposition: A | Discharge status: Home / Self Care | Payer: BC Managed Care – PPO | Source: Ambulatory Visit | Attending: Radiation Oncology | Admitting: Radiation Oncology

## 2018-03-27 ENCOUNTER — Other Ambulatory Visit: Payer: Self-pay

## 2018-03-27 ENCOUNTER — Encounter (HOSPITAL_BASED_OUTPATIENT_CLINIC_OR_DEPARTMENT_OTHER): Payer: Self-pay | Admitting: *Deleted

## 2018-03-27 ENCOUNTER — Ambulatory Visit (HOSPITAL_BASED_OUTPATIENT_CLINIC_OR_DEPARTMENT_OTHER)
Admission: RE | Admit: 2018-03-27 | Discharge: 2018-03-27 | Disposition: A | Discharge status: Home / Self Care | Payer: BC Managed Care – PPO | Source: Ambulatory Visit | Attending: Radiation Oncology | Admitting: Radiation Oncology

## 2018-03-27 VITALS — BP 128/79 | HR 71 | Temp 98.6°F | Resp 20

## 2018-03-27 DIAGNOSIS — Z9221 Personal history of antineoplastic chemotherapy: Secondary | ICD-10-CM | POA: Diagnosis not present

## 2018-03-27 DIAGNOSIS — Z87891 Personal history of nicotine dependence: Secondary | ICD-10-CM | POA: Insufficient documentation

## 2018-03-27 DIAGNOSIS — Z79899 Other long term (current) drug therapy: Secondary | ICD-10-CM | POA: Insufficient documentation

## 2018-03-27 DIAGNOSIS — C539 Malignant neoplasm of cervix uteri, unspecified: Secondary | ICD-10-CM

## 2018-03-27 DIAGNOSIS — E785 Hyperlipidemia, unspecified: Secondary | ICD-10-CM | POA: Diagnosis not present

## 2018-03-27 DIAGNOSIS — I10 Essential (primary) hypertension: Secondary | ICD-10-CM | POA: Diagnosis not present

## 2018-03-27 DIAGNOSIS — Z8249 Family history of ischemic heart disease and other diseases of the circulatory system: Secondary | ICD-10-CM | POA: Insufficient documentation

## 2018-03-27 DIAGNOSIS — E119 Type 2 diabetes mellitus without complications: Secondary | ICD-10-CM | POA: Diagnosis not present

## 2018-03-27 DIAGNOSIS — K5900 Constipation, unspecified: Secondary | ICD-10-CM | POA: Diagnosis not present

## 2018-03-27 DIAGNOSIS — C530 Malignant neoplasm of endocervix: Secondary | ICD-10-CM

## 2018-03-27 DIAGNOSIS — C7951 Secondary malignant neoplasm of bone: Secondary | ICD-10-CM | POA: Insufficient documentation

## 2018-03-27 DIAGNOSIS — Z809 Family history of malignant neoplasm, unspecified: Secondary | ICD-10-CM | POA: Insufficient documentation

## 2018-03-27 DIAGNOSIS — Z823 Family history of stroke: Secondary | ICD-10-CM | POA: Diagnosis not present

## 2018-03-27 HISTORY — PX: OPERATIVE ULTRASOUND: SHX5996

## 2018-03-27 HISTORY — PX: TANDEM RING INSERTION: SHX6199

## 2018-03-27 LAB — CBC WITH DIFFERENTIAL/PLATELET
ABS IMMATURE GRANULOCYTES: 0.14 10*3/uL — AB (ref 0.00–0.07)
Basophils Absolute: 0 10*3/uL (ref 0.0–0.1)
Basophils Relative: 0 %
Eosinophils Absolute: 0.1 10*3/uL (ref 0.0–0.5)
Eosinophils Relative: 1 %
HCT: 34.8 % — ABNORMAL LOW (ref 36.0–46.0)
Hemoglobin: 10.9 g/dL — ABNORMAL LOW (ref 12.0–15.0)
Immature Granulocytes: 2 %
Lymphocytes Relative: 9 %
Lymphs Abs: 0.7 10*3/uL (ref 0.7–4.0)
MCH: 29.6 pg (ref 26.0–34.0)
MCHC: 31.3 g/dL (ref 30.0–36.0)
MCV: 94.6 fL (ref 80.0–100.0)
Monocytes Absolute: 0.8 10*3/uL (ref 0.1–1.0)
Monocytes Relative: 11 %
NEUTROS ABS: 5.8 10*3/uL (ref 1.7–7.7)
Neutrophils Relative %: 77 %
PLATELETS: 403 10*3/uL — AB (ref 150–400)
RBC: 3.68 MIL/uL — ABNORMAL LOW (ref 3.87–5.11)
RDW: 19.4 % — ABNORMAL HIGH (ref 11.5–15.5)
WBC: 7.5 10*3/uL (ref 4.0–10.5)
nRBC: 0.4 % — ABNORMAL HIGH (ref 0.0–0.2)

## 2018-03-27 LAB — BASIC METABOLIC PANEL
Anion gap: 9 (ref 5–15)
BUN: 15 mg/dL (ref 8–23)
CO2: 23 mmol/L (ref 22–32)
Calcium: 9 mg/dL (ref 8.9–10.3)
Chloride: 104 mmol/L (ref 98–111)
Creatinine, Ser: 0.82 mg/dL (ref 0.44–1.00)
GFR calc Af Amer: >60 mL/min (ref >60)
GFR calc non Af Amer: >60 mL/min (ref >60)
Glucose, Bld: 122 mg/dL — ABNORMAL HIGH (ref 70–99)
Potassium: 4 mmol/L (ref 3.5–5.1)
Sodium: 136 mmol/L (ref 135–145)

## 2018-03-27 LAB — GLUCOSE, CAPILLARY: Glucose-Capillary: 113 mg/dL — ABNORMAL HIGH (ref 70–99)

## 2018-03-27 SURGERY — INSERTION, UTERINE TANDEM AND RING OR CYLINDER, FOR BRACHYTHERAPY
Anesthesia: General | Site: Cervix

## 2018-03-27 MED ORDER — LIDOCAINE 2% (20 MG/ML) 5 ML SYRINGE
INTRAMUSCULAR | Status: DC | PRN
  Administered 2018-03-27: 100 mg via INTRAVENOUS

## 2018-03-27 MED ORDER — EPHEDRINE SULFATE-NACL 50-0.9 MG/10ML-% IV SOSY
PREFILLED_SYRINGE | INTRAVENOUS | Status: DC | PRN
  Administered 2018-03-27: 10 mg via INTRAVENOUS

## 2018-03-27 MED ORDER — FENTANYL CITRATE (PF) 100 MCG/2ML IJ SOLN
INTRAMUSCULAR | Status: AC
  Filled 2018-03-27: qty 2

## 2018-03-27 MED ORDER — LIDOCAINE 2% (20 MG/ML) 5 ML SYRINGE
INTRAMUSCULAR | Status: AC
  Filled 2018-03-27: qty 5

## 2018-03-27 MED ORDER — FENTANYL CITRATE (PF) 100 MCG/2ML IJ SOLN
INTRAMUSCULAR | Status: DC | PRN
  Administered 2018-03-27: 50 ug via INTRAVENOUS
  Administered 2018-03-27 (×2): 25 ug via INTRAVENOUS

## 2018-03-27 MED ORDER — HYDROCODONE-ACETAMINOPHEN 7.5-325 MG PO TABS
1.0000 | ORAL_TABLET | Freq: Once | ORAL | Status: DC | PRN
  Filled 2018-03-27: qty 1

## 2018-03-27 MED ORDER — ONDANSETRON HCL 4 MG/2ML IJ SOLN
INTRAMUSCULAR | Status: DC | PRN
  Administered 2018-03-27: 4 mg via INTRAVENOUS

## 2018-03-27 MED ORDER — MIDAZOLAM HCL 5 MG/5ML IJ SOLN
INTRAMUSCULAR | Status: DC | PRN
  Administered 2018-03-27: 2 mg via INTRAVENOUS

## 2018-03-27 MED ORDER — CELECOXIB 200 MG PO CAPS
ORAL_CAPSULE | ORAL | Status: AC
  Filled 2018-03-27: qty 1

## 2018-03-27 MED ORDER — DEXAMETHASONE SODIUM PHOSPHATE 10 MG/ML IJ SOLN
INTRAMUSCULAR | Status: DC | PRN
  Administered 2018-03-27: 10 mg via INTRAVENOUS

## 2018-03-27 MED ORDER — SCOPOLAMINE 1 MG/3DAYS TD PT72
1.0000 | MEDICATED_PATCH | TRANSDERMAL | Status: DC
  Administered 2018-03-27: 1.5 mg via TRANSDERMAL
  Filled 2018-03-27: qty 1

## 2018-03-27 MED ORDER — GABAPENTIN 300 MG PO CAPS
ORAL_CAPSULE | ORAL | Status: AC
  Filled 2018-03-27: qty 1

## 2018-03-27 MED ORDER — FENTANYL CITRATE (PF) 100 MCG/2ML IJ SOLN
25.0000 ug | INTRAMUSCULAR | Status: DC | PRN
  Administered 2018-03-27 (×3): 25 ug via INTRAVENOUS
  Filled 2018-03-27: qty 1

## 2018-03-27 MED ORDER — GABAPENTIN 600 MG PO TABS
300.0000 mg | ORAL_TABLET | Freq: Once | ORAL | Status: AC
  Administered 2018-03-27: 300 mg via ORAL
  Filled 2018-03-27: qty 0.5

## 2018-03-27 MED ORDER — SODIUM CHLORIDE 0.9 % IR SOLN
Status: DC | PRN
  Administered 2018-03-27: 1000 mL via INTRAVESICAL

## 2018-03-27 MED ORDER — MIDAZOLAM HCL 2 MG/2ML IJ SOLN
INTRAMUSCULAR | Status: AC
  Filled 2018-03-27: qty 2

## 2018-03-27 MED ORDER — PROPOFOL 10 MG/ML IV BOLUS
INTRAVENOUS | Status: AC
  Filled 2018-03-27: qty 40

## 2018-03-27 MED ORDER — CELECOXIB 200 MG PO CAPS
200.0000 mg | ORAL_CAPSULE | Freq: Once | ORAL | Status: AC
  Administered 2018-03-27: 200 mg via ORAL
  Filled 2018-03-27: qty 1

## 2018-03-27 MED ORDER — SCOPOLAMINE 1 MG/3DAYS TD PT72
MEDICATED_PATCH | TRANSDERMAL | Status: AC
  Filled 2018-03-27: qty 1

## 2018-03-27 MED ORDER — ACETAMINOPHEN 500 MG PO TABS
ORAL_TABLET | ORAL | Status: AC
  Filled 2018-03-27: qty 2

## 2018-03-27 MED ORDER — ONDANSETRON HCL 4 MG/2ML IJ SOLN
INTRAMUSCULAR | Status: AC
  Filled 2018-03-27: qty 2

## 2018-03-27 MED ORDER — LACTATED RINGERS IV SOLN
INTRAVENOUS | Status: DC
  Administered 2018-03-27: 11:00:00 via INTRAVENOUS
  Filled 2018-03-27 (×2): qty 250

## 2018-03-27 MED ORDER — DEXAMETHASONE SODIUM PHOSPHATE 10 MG/ML IJ SOLN
INTRAMUSCULAR | Status: AC
  Filled 2018-03-27: qty 1

## 2018-03-27 MED ORDER — METOCLOPRAMIDE HCL 5 MG/ML IJ SOLN
10.0000 mg | Freq: Once | INTRAMUSCULAR | Status: DC | PRN
  Filled 2018-03-27: qty 2

## 2018-03-27 MED ORDER — PROPOFOL 10 MG/ML IV BOLUS
INTRAVENOUS | Status: DC | PRN
  Administered 2018-03-27: 120 mg via INTRAVENOUS
  Administered 2018-03-27: 50 mg via INTRAVENOUS

## 2018-03-27 MED ORDER — LACTATED RINGERS IV SOLN
INTRAVENOUS | Status: DC
  Administered 2018-03-27: 06:00:00 via INTRAVENOUS
  Filled 2018-03-27: qty 1000

## 2018-03-27 MED ORDER — EPHEDRINE 5 MG/ML INJ
INTRAVENOUS | Status: AC
  Filled 2018-03-27: qty 10

## 2018-03-27 MED ORDER — ACETAMINOPHEN 500 MG PO TABS
1000.0000 mg | ORAL_TABLET | Freq: Once | ORAL | Status: AC
  Administered 2018-03-27: 1000 mg via ORAL
  Filled 2018-03-27: qty 2

## 2018-03-27 MED ORDER — MEPERIDINE HCL 25 MG/ML IJ SOLN
6.2500 mg | INTRAMUSCULAR | Status: DC | PRN
  Filled 2018-03-27: qty 1

## 2018-03-27 SURGICAL SUPPLY — 24 items
BNDG CONFORM 2 STRL LF (GAUZE/BANDAGES/DRESSINGS) IMPLANT
COVER WAND RF STERILE (DRAPES) ×4 IMPLANT
DILATOR CANAL MILEX (MISCELLANEOUS) IMPLANT
GAUZE 4X4 16PLY RFD (DISPOSABLE) ×4 IMPLANT
GLOVE BIO SURGEON STRL SZ7 (GLOVE) ×2 IMPLANT
GLOVE BIO SURGEON STRL SZ7.5 (GLOVE) ×10 IMPLANT
GLOVE BIOGEL PI IND STRL 7.5 (GLOVE) IMPLANT
GLOVE BIOGEL PI INDICATOR 7.5 (GLOVE) ×4
GOWN STRL REUS W/TWL LRG LVL3 (GOWN DISPOSABLE) ×6 IMPLANT
HOLDER FOLEY CATH W/STRAP (MISCELLANEOUS) ×4 IMPLANT
HOVERMATT SINGLE USE (MISCELLANEOUS) ×4 IMPLANT
IV NS 1000ML (IV SOLUTION) ×4
IV NS 1000ML BAXH (IV SOLUTION) ×2 IMPLANT
IV SET EXTENSION GRAVITY 40 LF (IV SETS) ×4 IMPLANT
KIT TURNOVER CYSTO (KITS) ×4 IMPLANT
PACK VAGINAL MINOR WOMEN LF (CUSTOM PROCEDURE TRAY) ×4 IMPLANT
PACKING VAGINAL (PACKING) IMPLANT
PAD ABD 8X10 STRL (GAUZE/BANDAGES/DRESSINGS) ×4 IMPLANT
PAD OB MATERNITY 4.3X12.25 (PERSONAL CARE ITEMS) IMPLANT
TOWEL OR 17X26 10 PK STRL BLUE (TOWEL DISPOSABLE) ×4 IMPLANT
TRAY FOLEY BAG SILVER LF 14FR (CATHETERS) ×2 IMPLANT
TRAY FOLEY W/BAG SLVR 16FR (SET/KITS/TRAYS/PACK)
TRAY FOLEY W/BAG SLVR 16FR ST (SET/KITS/TRAYS/PACK) ×2 IMPLANT
WATER STERILE IRR 500ML POUR (IV SOLUTION) ×4 IMPLANT

## 2018-03-27 NOTE — Interval H&P Note (Signed)
History and Physical Interval Note:  03/27/2018 7:29 AM  Toni Peterson  has presented today for surgery, with the diagnosis of cervical cancer  The various methods of treatment have been discussed with the patient and family. After consideration of risks, benefits and other options for treatment, the patient has consented to  Procedure(s): TANDEM RING INSERTION (N/A) OPERATIVE ULTRASOUND (N/A) as a surgical intervention .  The patient's history has been reviewed, patient examined, no change in status, stable for surgery.  I have reviewed the patient's chart and labs.  Questions were answered to the patient's satisfaction.     Gery Pray

## 2018-03-27 NOTE — Anesthesia Postprocedure Evaluation (Signed)
Anesthesia Post Note  Patient: Toni Peterson  Procedure(s) Performed: TANDEM RING INSERTION (N/A Cervix) OPERATIVE ULTRASOUND (N/A Bladder)     Patient location during evaluation: PACU Anesthesia Type: General Level of consciousness: awake and alert and oriented Pain management: pain level controlled Vital Signs Assessment: post-procedure vital signs reviewed and stable Respiratory status: spontaneous breathing, nonlabored ventilation and respiratory function stable Cardiovascular status: blood pressure returned to baseline and stable Postop Assessment: no apparent nausea or vomiting Anesthetic complications: no    Last Vitals:  Vitals:   03/27/18 0819 03/27/18 0830  BP: 136/75 137/76  Pulse: 80 76  Resp: 17 14  Temp: 36.8 C   SpO2: 96% 95%    Last Pain:  Vitals:   03/27/18 0819  TempSrc:   PainSc: 0-No pain                 Dalayna Lauter A.

## 2018-03-27 NOTE — Op Note (Signed)
03/27/2018  8:47 AM  PATIENT:  Toni Peterson  68 y.o. female  PRE-OPERATIVE DIAGNOSIS:  cervical cancer  POST-OPERATIVE DIAGNOSIS:  cervical cancer  PROCEDURE:  Procedure(s): TANDEM RING INSERTION (N/A) OPERATIVE ULTRASOUND (N/A)  SURGEON:  Surgeon(s) and Role:    * Gery Pray, MD - Primary  PHYSICIAN ASSISTANT:   ASSISTANTS: none   ANESTHESIA:   general  EBL:  0 mL   BLOOD ADMINISTERED:none  DRAINS: Urinary Catheter (Foley)   LOCAL MEDICATIONS USED:  NONE  SPECIMEN:  No Specimen  DISPOSITION OF SPECIMEN:  N/A  COUNTS:  YES  TOURNIQUET:  * No tourniquets in log *  DICTATION: The patient was transported to the outpatient surgical center room #4. Patient was prepped and draped in the usual sterile fashion placed in the dorsal lithotomy position.A timeout for the procedure, estimated length of the procedure and preoperative medications with performed. Patient had a Foley catheter placed with ~ 219mL of saline backfilled into the bladder for ultrasound imaging purposes. Exam under anesthesia revealed the cervical mass to be significantly decreased in size and softer on exam.the cervical area estimated to be approximately2.5x2.0cm in size. No parametrial extension. Patient proceeded to undergo dilation of the cervical os and sounding of the uterus. The uterus was noted to be expanded and on ultrasound this appeared to be multiple fibroids. Once the cervical os was dilated yellow fluiddrained from the uterus.  On ultrasound the uterus sounded to approximately 8.5 cm. Patient proceeded to undergo dilation of the cervical os and then placement of a 60 mm cervical sleeve. Good ultrasound imaging was obtained. The uterus was noted to be in the anteverted position. Patient then had placement of a 60 mm 45 tandem within the cervical sleeve in the endocervical canal and midportion of the endometrial canal. Patient then had a 45ring with smallshielding cap placed along the  cervical region. This was then followed by placement of a rectal paddle posteriorly. Patient tolerated the procedure well. Later in the day the patient will be transported to the radiation oncology Department for planning and herfifthhigh-dose-rate treatment. The patient is planned to have a total of 5 high-dose rate treatments directed at the cervical region so this will be her final treatment and will complete her course of therapy.  PLAN OF CARE: transferred to radiation oncology for planning and treatment  PATIENT DISPOSITION:  PACU - hemodynamically stable.   Delay start of Pharmacological VTE agent (>24hrs) due to surgical blood loss or risk of bleeding: not applicable

## 2018-03-27 NOTE — Anesthesia Procedure Notes (Signed)
Procedure Name: LMA Insertion Date/Time: 03/27/2018 7:36 AM Performed by: Bonney Aid, CRNA Pre-anesthesia Checklist: Patient identified, Emergency Drugs available, Suction available and Patient being monitored Patient Re-evaluated:Patient Re-evaluated prior to induction Oxygen Delivery Method: Circle system utilized Preoxygenation: Pre-oxygenation with 100% oxygen Induction Type: IV induction Ventilation: Mask ventilation without difficulty LMA: LMA inserted LMA Size: 4.0 Number of attempts: 1 Airway Equipment and Method: Bite block Placement Confirmation: positive ETCO2 Tube secured with: Tape Dental Injury: Teeth and Oropharynx as per pre-operative assessment

## 2018-03-27 NOTE — Transfer of Care (Signed)
Immediate Anesthesia Transfer of Care Note  Patient: Toni Peterson  Procedure(s) Performed: TANDEM RING INSERTION (N/A ) OPERATIVE ULTRASOUND (N/A )  Patient Location: PACU  Anesthesia Type:General  Level of Consciousness: drowsy  Airway & Oxygen Therapy: Patient Spontanous Breathing and Patient connected to nasal cannula oxygen  Post-op Assessment: Report given to RN  Post vital signs: Reviewed and stable  Last Vitals:  Vitals Value Taken Time  BP 136/75 03/27/2018  8:19 AM  Temp 36.8 C 03/27/2018  8:19 AM  Pulse 77 03/27/2018  8:22 AM  Resp 17 03/27/2018  8:22 AM  SpO2 95 % 03/27/2018  8:22 AM  Vitals shown include unvalidated device data.  Last Pain:  Vitals:   03/27/18 0556  TempSrc:   PainSc: 0-No pain      Patients Stated Pain Goal: 5 (74/[id number]/40 [id number])  Complications: No apparent anesthesia complications

## 2018-03-27 NOTE — Progress Notes (Signed)
  Radiation Oncology         [phone] ________________________________  Name: Toni Peterson MRN: [id number]  Date: 03/27/2018  DOB: November 25, 1950  CC: Antony Contras, MD  Everitt Amber, MD  HDR BRACHYTHERAPY NOTE  DIAGNOSIS: Cervical cancer  NARRATIVE: The patient was brought to the [facility] suite. Identity was confirmed. All relevant records and images related to the planned course of therapy were reviewed. The patient freely provided informed written consent to proceed with treatment after reviewing the details related to the planned course of therapy. The consent form was witnessed and verified by the simulation staff. Then, the patient was set-up in a stable reproducible supine position for radiation therapy. The tandem ring system was accessed and fiducial markers were placed within the tandem and ring.   Simple treatment device note: On the operating room the patient had construction of her custom tandem ring system. She will be treated with a 45 tandem/ring system. The patient had placement of a 60 mm tandem. A cervical ring with a small shielding was used for her treatment. A rectal paddle was also part of her custom set up device.  Verification simulation note: An AP and lateral film was obtained through the pelvis area. This was compared to the patient's planning films documenting accurate position of the tandem/ring system for treatment.  High-dose-rate brachytherapy treatment note:  The remote afterloading device was accessed through catheter system and attached to the tandem ring system. Patient then proceeded to undergo her fifth high-dose-rate treatment directed at the cervix. The patient was prescribed a dose of 5.5 gray to be delivered to the high-risk clinical target volume.. Patient was treated with 2 channels using 26 dwell positions. Treatment time was 542.6 seconds. The patient tolerated the procedure well. After completion of her therapy, a radiation survey was performed  documenting return of the iridium source into the [employer] safe. The patient was then transferred to the nursing suite. She then had removal of the rectal paddle followed by the tandem and ring system. The patient tolerated the removal well.  PLAN: the patient has completed her course of radiation therapy. She will be scheduled for routine follow-up in one month. ________________________________  Blair Promise, PhD, MD

## 2018-03-27 NOTE — Progress Notes (Signed)
  Radiation Oncology         [phone] ________________________________  Name: Toni Peterson MRN: [id number]  Date: 03/27/2018  DOB: [birth year]  SIMULATION AND TREATMENT PLANNING NOTE HDR BRACHYTHERAPY  DIAGNOSIS:  Cervical cancer FIGO stage IVB (cT2, cN0, pM1)  NARRATIVE:  The patient was brought to the [facility].  Identity was confirmed.  All relevant records and images related to the planned course of therapy were reviewed.  The patient freely provided informed written consent to proceed with treatment after reviewing the details related to the planned course of therapy. The consent form was witnessed and verified by the simulation staff.  Then, the patient was set-up in a stable reproducible  supine position for radiation therapy.  CT images were obtained.  Surface markings were placed.  The CT images were loaded into the planning software.  Then the target and avoidance structures were contoured.  Treatment planning then occurred.  The radiation prescription was entered and confirmed.   I have requested : Brachytherapy Isodose Plan and Dosimetry Calculations to plan the radiation distribution.    PLAN:  The patient will receive 5.5 Gy in 1 fraction. Iridium 192 will be the high-dose-rate source. The patient will be treated with the tandem ring system.    ________________________________  Blair Promise, PhD, MD  This document serves as a record of services personally performed by Gery Pray, MD. It was created on his behalf by Mary-Margaret Loma Messing, a trained medical scribe. The creation of this record is based on the scribe's personal observations and the provider's statements to them. This document has been checked and approved by the attending provider.

## 2018-03-28 ENCOUNTER — Telehealth: Payer: Self-pay | Admitting: Oncology

## 2018-03-28 ENCOUNTER — Encounter: Payer: Self-pay | Admitting: Oncology

## 2018-03-28 ENCOUNTER — Encounter (HOSPITAL_BASED_OUTPATIENT_CLINIC_OR_DEPARTMENT_OTHER): Payer: Self-pay | Admitting: Radiation Oncology

## 2018-03-28 LAB — URINE CULTURE: Culture: NO GROWTH

## 2018-03-28 NOTE — Telephone Encounter (Signed)
Called [place] and advised her that Dr. Sondra Come recommends returning to work 1 month after the tandem treatments.  Also that is up to her regarding returning part time or full time.  She asked that she start back part time for 1 month and then full time.  Advised her that we will contact her with a letter with the dates and exact return to work date.

## 2018-03-29 ENCOUNTER — Encounter: Payer: Self-pay | Admitting: Radiation Oncology

## 2018-03-29 NOTE — Progress Notes (Signed)
  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 01/16/18-02/21/18 (external beam)       2. 02/28/18, 03/07/18, 03/16/18, 03/20/18, 03/27/18 (brachytherapy treatments)  Site/dose:   1. Pelvis; 25 fractions of 1.8 Gy for a total of 45 Gy (including solitary bone metastasis at L5)           2. Cervix; 5 fractions of 5.5 Gy for a total of 27.5 Gy  Beams/energy:   1. Photon 3D; 15X        2. HDR Ir-192 brachytherapy, tandem/ring system for treatment  Narrative: The patient tolerated radiation treatment relatively well.     At the beginning of treatment, pt reported fatigue and nocturia 2x a night. Pt denied pain, N/V, dysuria, vaginal/rectal bleeding throughout treatments. Towards the end of treatment, pt reported occasional burning with urination. Her hemorrhoids improved over the course of radiation. Pt denied skin concerns over her course of treatment.  She tolerated brachytherapy treatments well except for some nausea and emesis after implant removal with her first procedure, this was likely related to pain medication  Plan: The patient has completed radiation treatment. The patient will return to radiation oncology clinic for routine followup in one month. I advised them to call or return sooner if they have any questions or concerns related to their recovery or treatment.  -----------------------------------  Blair Promise, PhD, MD  This document serves as a record of services personally performed by Gery Pray, MD. It was created on his behalf by Mary-Margaret Loma Messing, a trained medical scribe. The creation of this record is based on  the scribe's personal observations and the provider's statements to them. This document has been checked and approved by the attending provider.

## 2018-03-29 NOTE — Telephone Encounter (Signed)
[place] called back and said she talked with the HR person at work and she does not need a letter right away.  She will let me know when she returns to work.  Also she said they will be faxing follow up FMLA forms soon.

## 2018-03-30 ENCOUNTER — Ambulatory Visit: Payer: Self-pay | Admitting: Radiation Oncology

## 2018-04-07 ENCOUNTER — Telehealth: Payer: Self-pay | Admitting: Genetics

## 2018-04-07 NOTE — Telephone Encounter (Signed)
Left message for patient asking her to call back to go over genetic test results.

## 2018-04-10 ENCOUNTER — Encounter: Payer: Self-pay | Admitting: Oncology

## 2018-04-11 ENCOUNTER — Ambulatory Visit: Payer: Self-pay | Admitting: Genetic Counselor

## 2018-04-11 ENCOUNTER — Encounter: Payer: Self-pay | Admitting: Genetic Counselor

## 2018-04-11 ENCOUNTER — Telehealth: Payer: Self-pay | Admitting: Genetic Counselor

## 2018-04-11 DIAGNOSIS — Z1379 Encounter for other screening for genetic and chromosomal anomalies: Secondary | ICD-10-CM | POA: Insufficient documentation

## 2018-04-11 NOTE — Progress Notes (Signed)
HPI:  Toni Peterson was previously seen in the [place] clinic due to a personal and family history of cancer and concerns regarding a hereditary predisposition to cancer. Please refer to our prior cancer genetics clinic note for more information regarding Toni Peterson's medical, social and family histories, and our assessment and recommendations, at the time. Toni Peterson recent genetic test results were disclosed to her, as were recommendations warranted by these results. These results and recommendations are discussed in more detail below.  CANCER HISTORY:  Oncology History   MSI - Stable on cervix biopsy PD-L1 2%  Bone biopsy is consistent with metastatic GYN primary MMR protein MLH1 and PMS2 abnormalities noted on colon biopsy     Cervical cancer ([place])   09/08/2017 Initial Diagnosis    Patient has a history of symptomatic endometrial fibroids in 2013 for which she underwent hysteroscopic resection with benign pathology.  She has had lifelong normal Pap smears including in 2019.  Of note HPV testing was not performed in this Pap in 2019.  She developed symptoms of very light vaginal spotting in August 2019     11/22/2017 Procedure    She was seen by her gynecologist, Dr. Radene Knee, who performed a pelvic examination.  On physical examination and endocervical mass was appreciated and felt to be likely to be a polyp or fibroid was biopsied on 11/22/17.       11/22/2017 Pathology Results    This revealed poorly differentiated invasive carcinoma.  Immunostains revealed that it was CK7 positive, P 16+, p53 positive, PAX 8+ and CK 5 6 weakly positive.  CK 20, ER PR and p53 immunostains were negative.  The morphology and Immuno profile favored a gynecologic primary with endocervical and endometrial adenocarcinoma in the differential diagnosis.     11/22/2017 Imaging    An ultrasound scan on November 22, 2017 revealed several intramural fibroids.  With saline infusion there was a 1.8 cm  density within the endometrial cavity which could be a fibroid or polyp.     12/08/2017 Imaging    PET:  1. Marked hypermetabolism involving the cervix and uterus. Difficult to be certain of the origin but favor cervix. No evidence of parametrial or parauterine tumor and no pelvic or retroperitoneal hypermetabolic lymphadenopathy. 2. L5 vertebral body lesion worrisome for metastasis. 3. Hypermetabolic area involving the hepatic flexure region of the colon is worrisome for colon cancer. Recommend colonoscopy. 4. Benign left adrenal gland adenoma. 5. Symmetric hypermetabolism in the tongue base and tonsillar regions is likely inflamed lymphoid tissue. No neck mass or adenopathy.    12/19/2017 Imaging    1. Diffuse marrow replacement in the L5 vertebral body, indeterminate though metastatic disease is a concern. Correlate with upcoming biopsy. No extraosseous tumor or pathologic fracture. 2. Marrow signal abnormality on both sides of the T11-12 disc space favored to be degenerative. 3. Severe L4-5 facet arthrosis with grade 1 anterolisthesis, mild bilateral lateral recess stenosis, and mild-to-moderate bilateral neural foraminal stenosis. Findings may worsen with standing. 4. Moderate bilateral neural foraminal stenosis at L5-S1. 5. Partially visualized uterine masses which may reflect a combination of known adenocarcinoma and fibroids. 4 cm right adnexal mass may reflect an exophytic subserosal fibroid or ovarian neoplasm.    12/20/2017 Echocardiogram    LV EF: 60% -   65%    12/23/2017 Procedure    Successful placement of a right IJ approach Power Port with ultrasound and fluoroscopic guidance. The catheter is ready for use.    12/23/2017  Pathology Results    Bone, biopsy, L5 - METASTATIC POORLY DIFFERENTIATED CARCINOMA. - SEE COMMENT. Microscopic Comment Dr. Vic Ripper has reviewed the case and concurs with this interpretation. The case was discussed wtih Joylene John on 12/22/2017. Per  request, a block will be sent for PDL1 testing and the results reported separately    01/11/2018 Cancer Staging    Staging form: Cervix Uteri, AJCC 8th Edition - Clinical: FIGO Stage IVB (cT2, cN0, pM1) - Signed by Heath Lark, MD on 01/11/2018    01/16/2018 - 03/27/2018 Radiation Therapy    Radiation treatment dates:   1. 01/16/18-02/21/18 (external beam)                                                   2. 02/28/18, 03/07/18, 03/16/18, 03/20/18, 03/27/18 (brachytherapy treatments)  Site/dose:   1. Pelvis; 25 fractions of 1.8 Gy for a total of 45 Gy (including solitary bone metastasis at L5)                      2. Cervix; 5 fractions of 5.5 Gy for a total of 27.5 Gy  Beams/energy:   1. Photon 3D; 15X                              2. HDR Ir-192 brachytherapy, tandem/ring system for treatment    01/17/2018 - 02/22/2018 Chemotherapy    The patient had weekly cisplatin     Colon cancer ([place])   01/11/2018 Procedure    - Hemorrhoids found on perianal exam. - Malignant partially obstructing tumor at the hepatic flexure. Biopsied. Tattooed. - Two 2 to 5 mm polyps in the sigmoid colon, removed with a cold snare. Resected and retrieved. - Diverticulosis in the sigmoid colon, in the distal descending colon and in the ascending colon. - Non-bleeding internal hemorrhoids. - The examined portion of the ileum was normal.    01/11/2018 Pathology Results    1. Colon, biopsy, hepatic flexure - ADENOCARCINOMA, SEE COMMENT. 2. Colon, polyp(s), sigmoid, polyp (2) - HYPERPLASTIC POLYP (X2 FRAGMENTS). - NO DYSPLASIA OR MALIGNANCY. Microscopic Comment 1. There are additional fragments of tubular adenoma suggesting this is a primary colon adenocarcinoma.     02/07/2018 Cancer Staging    Staging form: Colon and Rectum, AJCC 8th Edition - Clinical: Stage I (cT1, cN0, cM0) - Signed by Heath Lark, MD on 02/07/2018     Genetic Testing    Negative genetic testing on the CancerNext panel.  The CancerNext gene  panel offered by [employer] includes sequencing and rearrangement analysis for the following 34 genes:   APC, ATM, BARD1, BMPR1A, BRCA1, BRCA2, BRIP1, CDH1, CDK4, CDKN2A, CHEK2, DICER1, HOXB13, EPCAM, GREM1, MLH1, MRE11A, MSH2, MSH6, MUTYH, NBN, NF1, PALB2, PMS2, POLD1, POLE, PTEN, RAD50, RAD51C, RAD51D, SMAD4, SMARCA4, STK11, and TP53.  The report date is April 05, 2018.     FAMILY HISTORY:  We obtained a detailed, 4-generation family history.  Significant diagnoses are listed below: Family History  Problem Relation Age of Onset  . Stroke Brother        Died 44  . Heart attack Mother        Vague  . Hypertension Mother   . Hyperlipidemia Other   . Hypertension Other   . Hypertension Father   .  Breast cancer Paternal Aunt 29  . Breast cancer Cousin 69  . Colon cancer Neg Hx   . Esophageal cancer Neg Hx   . Rectal cancer Neg Hx   . Stomach cancer Neg Hx     Toni Peterson has a 64 year-old son with no hx of cancer.  She has 2 grandchildren.   Toni Peterson has a brother who died at 66 due to a stroke.    Toni Peterson father: died in his 20's with no hx of cancer.  Paternal Aunts/Uncles: 5 paternal aunts, 2 paternal uncles. 1 paternal aunt was dx with breast cancer at about age 45.  Paternal cousins: 1 paternal cousin was dx with breast cancer at age 67.  Paternal grandfather: no hx of cancer.  Paternal grandmother:no hx of cancer.   Toni Peterson mother: died in her 28's, heart attack.  She had a full hysterectomy in her 61's.  Maternal Aunts/Uncles: 2 maternal aunts with no hx of cancer.  Maternal cousins: no hx of cancer.  Maternal grandfather: died in  His 84's with no hx of cancer.  Maternal grandmother:no hx of cancer.   Toni Peterson is unaware of previous family history of genetic testing for hereditary cancer risks. Patient's maternal ancestors are of African American/Black descent, and paternal ancestors are of African American/Black descent. There is no reported Ashkenazi  Jewish ancestry. There is no known consanguinity.  GENETIC TEST RESULTS: Genetic testing reported out on April 05, 2018 through the [facility] cancer panel found no deleterious mutations.  The CancerNext gene panel offered by [employer] includes sequencing and rearrangement analysis for the following 34 genes:   APC, ATM, BARD1, BMPR1A, BRCA1, BRCA2, BRIP1, CDH1, CDK4, CDKN2A, CHEK2, DICER1, HOXB13, EPCAM, GREM1, MLH1, MRE11A, MSH2, MSH6, MUTYH, NBN, NF1, PALB2, PMS2, POLD1, POLE, PTEN, RAD50, RAD51C, RAD51D, SMAD4, SMARCA4, STK11, and TP53.   The test report has been scanned into EPIC and is located under the Molecular Pathology section of the Results Review tab.    We discussed with Toni Peterson that since the current genetic testing is not perfect, it is possible there may be a gene mutation in one of these genes that current testing cannot detect, but that chance is small.  We also discussed, that it is possible that another gene that has not yet been discovered, or that we have not yet tested, is responsible for the cancer diagnoses in the family, and it is, therefore, important to remain in touch with cancer genetics in the future so that we can continue to offer Toni Peterson the most up to date genetic testing.   CANCER SCREENING RECOMMENDATIONS:  This result is reassuring and indicates that Toni Peterson likely does not have an increased risk for a future cancer due to a mutation in one of these genes. This normal test also suggests that Toni Peterson's cancer was most likely not due to an inherited predisposition associated with one of these genes.  Most cancers happen by chance and this negative test suggests that her cancer falls into this category.  We, therefore, recommended she continue to follow the cancer management and screening guidelines provided by her oncology and primary healthcare provider.   An individual's cancer risk and medical management are not determined by genetic test results alone.  Overall cancer risk assessment incorporates additional factors, including personal medical history, family history, and any available genetic information that may result in a personalized plan for cancer prevention and surveillance.  RECOMMENDATIONS FOR FAMILY MEMBERS:  Women in  this family might be at some increased risk of developing cancer, over the general population risk, simply due to the family history of cancer.  We recommended women in this family have a yearly mammogram beginning at age 46, or 35 years younger than the earliest onset of cancer, an annual clinical breast exam, and perform monthly breast self-exams. Women in this family should also have a gynecological exam as recommended by their primary provider. All family members should have a colonoscopy by age 49.  FOLLOW-UP: Lastly, we discussed with Toni Peterson that cancer genetics is a rapidly advancing field and it is possible that new genetic tests will be appropriate for her and/or her family members in the future. We encouraged her to remain in contact with cancer genetics on an annual basis so we can update her personal and family histories and let her know of advances in cancer genetics that may benefit this family.   Our contact number was provided. Toni Peterson questions were answered to her satisfaction, and she knows she is welcome to call us at anytime with additional questions or concerns.   Roma Kayser, MS, [facility] Certified Genetic Counselor [email or web address]

## 2018-04-11 NOTE — Telephone Encounter (Signed)
Revealed negative genetic testing.  Discussed that we do not know why she has colon cancer or why there is cancer in the family. It could be due to a different gene that we are not testing, or maybe our current technology may not be able to pick something up.  It will be important for her to keep in contact with genetics to keep up with whether additional testing may be needed.    

## 2018-05-01 ENCOUNTER — Ambulatory Visit: Admit: 2018-05-01 | Payer: BC Managed Care – PPO | Admitting: Radiation Oncology

## 2018-05-08 ENCOUNTER — Telehealth: Payer: Self-pay | Admitting: *Deleted

## 2018-05-08 ENCOUNTER — Encounter: Payer: Self-pay | Admitting: Radiation Oncology

## 2018-05-08 ENCOUNTER — Ambulatory Visit
Admission: RE | Admit: 2018-05-08 | Discharge: 2018-05-08 | Disposition: A | Discharge status: Home / Self Care | Payer: BC Managed Care – PPO | Source: Ambulatory Visit | Attending: Radiation Oncology | Admitting: Radiation Oncology

## 2018-05-08 ENCOUNTER — Other Ambulatory Visit: Payer: Self-pay

## 2018-05-08 VITALS — BP 120/78 | HR 70 | Temp 98.4°F | Resp 20 | Ht 61.0 in | Wt 175.2 lb

## 2018-05-08 DIAGNOSIS — Z79899 Other long term (current) drug therapy: Secondary | ICD-10-CM | POA: Insufficient documentation

## 2018-05-08 DIAGNOSIS — C530 Malignant neoplasm of endocervix: Secondary | ICD-10-CM | POA: Insufficient documentation

## 2018-05-08 DIAGNOSIS — C7951 Secondary malignant neoplasm of bone: Secondary | ICD-10-CM | POA: Diagnosis not present

## 2018-05-08 DIAGNOSIS — Y842 Radiological procedure and radiotherapy as the cause of abnormal reaction of the patient, or of later complication, without mention of misadventure at the time of the procedure: Secondary | ICD-10-CM | POA: Insufficient documentation

## 2018-05-08 LAB — URINALYSIS, COMPLETE (UACMP) WITH MICROSCOPIC
Bacteria, UA: NONE SEEN
Bilirubin Urine: NEGATIVE
GLUCOSE, UA: NEGATIVE mg/dL
Hgb urine dipstick: NEGATIVE
Ketones, ur: NEGATIVE mg/dL
Nitrite: NEGATIVE
PROTEIN: NEGATIVE mg/dL
Specific Gravity, Urine: 1.025 (ref 1.005–1.030)
pH: 5 (ref 5.0–8.0)

## 2018-05-08 LAB — CMP (CANCER CENTER ONLY)
ALT: 12 U/L (ref 0–44)
AST: 12 U/L — ABNORMAL LOW (ref 15–41)
Albumin: 3.3 g/dL — ABNORMAL LOW (ref 3.5–5.0)
Alkaline Phosphatase: 89 U/L (ref 38–126)
Anion gap: 10 (ref 5–15)
BILIRUBIN TOTAL: 0.4 mg/dL (ref 0.3–1.2)
BUN: 11 mg/dL (ref 8–23)
CO2: 25 mmol/L (ref 22–32)
Calcium: 9.5 mg/dL (ref 8.9–10.3)
Chloride: 104 mmol/L (ref 98–111)
Creatinine: 0.81 mg/dL (ref 0.44–1.00)
GFR, Est AFR Am: >60 mL/min (ref >60)
Glucose, Bld: 119 mg/dL — ABNORMAL HIGH (ref 70–99)
POTASSIUM: 3.9 mmol/L (ref 3.5–5.1)
Sodium: 139 mmol/L (ref 135–145)
TOTAL PROTEIN: 7.1 g/dL (ref 6.5–8.1)

## 2018-05-08 LAB — CBC WITH DIFFERENTIAL (CANCER CENTER ONLY)
Abs Immature Granulocytes: 0.03 10*3/uL (ref 0.00–0.07)
BASOS PCT: 1 %
Basophils Absolute: 0 10*3/uL (ref 0.0–0.1)
Eosinophils Absolute: 1 10*3/uL — ABNORMAL HIGH (ref 0.0–0.5)
Eosinophils Relative: 14 %
HCT: 33.5 % — ABNORMAL LOW (ref 36.0–46.0)
Hemoglobin: 10.7 g/dL — ABNORMAL LOW (ref 12.0–15.0)
Immature Granulocytes: 0 %
Lymphocytes Relative: 8 %
Lymphs Abs: 0.6 10*3/uL — ABNORMAL LOW (ref 0.7–4.0)
MCH: 30.3 pg (ref 26.0–34.0)
MCHC: 31.9 g/dL (ref 30.0–36.0)
MCV: 94.9 fL (ref 80.0–100.0)
Monocytes Absolute: 0.6 10*3/uL (ref 0.1–1.0)
Monocytes Relative: 8 %
Neutro Abs: 4.9 10*3/uL (ref 1.7–7.7)
Neutrophils Relative %: 69 %
Platelet Count: 397 10*3/uL (ref 150–400)
RBC: 3.53 MIL/uL — ABNORMAL LOW (ref 3.87–5.11)
RDW: 15.6 % — ABNORMAL HIGH (ref 11.5–15.5)
WBC Count: 7.1 10*3/uL (ref 4.0–10.5)
nRBC: 0 % (ref 0.0–0.2)

## 2018-05-08 LAB — MAGNESIUM: Magnesium: 2 mg/dL (ref 1.7–2.4)

## 2018-05-08 NOTE — Patient Instructions (Signed)
Coronavirus (COVID-19) Are you at risk?  Are you at risk for the Coronavirus (COVID-19)?  To be considered HIGH RISK for Coronavirus (COVID-19), you have to meet the following criteria:  . Traveled to [place], [place], [place], [place] or [place]; or in the [place] to [place], [place], [place], or [place]; and have fever, cough, and shortness of breath within the last 2 weeks of travel OR . Been in close contact with a person diagnosed with COVID-19 within the last 2 weeks and have fever, cough, and shortness of breath . IF YOU DO NOT MEET THESE CRITERIA, YOU ARE CONSIDERED LOW RISK FOR COVID-19.  What to do if you are HIGH RISK for COVID-19?  . If you are having a medical emergency, call 911. . Seek medical care right away. Before you go to a doctor's office, urgent care or emergency department, call ahead and tell them about your recent travel, contact with someone diagnosed with COVID-19, and your symptoms. You should receive instructions from your physician's office regarding next steps of care.  . When you arrive at healthcare provider, tell the healthcare staff immediately you have returned from visiting [place], [place], [place], [place] or [place]; or traveled in the [place] to [place], [place], [place], or [place]; in the last two weeks or you have been in close contact with a person diagnosed with COVID-19 in the last 2 weeks.   . Tell the health care staff about your symptoms: fever, cough and shortness of breath. . After you have been seen by a medical provider, you will be either: o Tested for (COVID-19) and discharged home on quarantine except to seek medical care if symptoms worsen, and asked to  - Stay home and avoid contact with others until you get your results (4-5 days)  - Avoid travel on public transportation if possible (such as bus, train, or airplane) or o Sent to the Emergency Department by EMS for evaluation, COVID-19 testing, and possible  admission depending on your condition and test results.  What to do if you are LOW RISK for COVID-19?  Reduce your risk of any infection by using the same precautions used for avoiding the common cold or flu:  . Wash your hands often with soap and warm water for at least 20 seconds.  If soap and water are not readily available, use an alcohol-based hand sanitizer with at least 60% alcohol.  . If coughing or sneezing, cover your mouth and nose by coughing or sneezing into the elbow areas of your shirt or coat, into a tissue or into your sleeve (not your hands). . Avoid shaking hands with others and consider head nods or verbal greetings only. . Avoid touching your eyes, nose, or mouth with unwashed hands.  . Avoid close contact with people who are sick. . Avoid places or events with large numbers of people in one location, like concerts or sporting events. . Carefully consider travel plans you have or are making. . If you are planning any travel outside or inside the [place], visit the CDC's Travelers' Health webpage for the latest health notices. . If you have some symptoms but not all symptoms, continue to monitor at home and seek medical attention if your symptoms worsen. . If you are having a medical emergency, call 911.   ADDITIONAL HEALTHCARE OPTIONS FOR PATIENTS  [facility] / e-Visit: [email or web address]         [facility]: [phone]  [facility]: [phone]                   [facility]: [place] Instructions for the Insertion and Care of Your Vaginal Dilator  Why Do I Need a Vaginal Dilator?  Internal radiation therapy may cause scar tissue to form at the top of your vagina (vaginal cuff).  This may make vaginal examinations difficult in the future. You can prevent scar tissue from forming by using a vaginal dilator (a smooth plastic rod), and/or  by having regular sexual intercourse.  If not using the dilator you should be having intercourse two or three times a week.  If you are unable to have intercourse, you should use your vaginal dilator.  You may have some spotting or bleeding from your dilator or intercourse the first few times. You may also have some discomfort. If discomfort occurs with intercourse, you and your partner may need to stop for a while and try again later.  How to Use Your Vaginal Dilator  - Wash the dilator with soap and water before and after each use. - Check the dilator to be sure it is smooth. Do not use the dilator if you find any roughspots. - Coat the dilator with K-Y Jelly, Astroglide, or Replens. Do not use Vaseline, baby oil, or other oil based lubricants. They are not water-soluble and can be irritating to the tissues in the vagina. - Lie on your back with your knees bent and legs apart. - Insert the rounded end of the dilator into your vagina as far as it will go without causing pain or discomfort. - Close your knees and slowly straighten your legs. - Keep the dilator in your vagina for about 10 to 15 minutes.  Please use 3 times a week, for example: Monday, Wednesday and Friday evenings. [facility] your knees, open your legs, and gently remove the dilator. - Gently cleanse the skin around the vaginal opening. - Wash the dilator after each use. -  It is important that you use the dilator routinely until instructed otherwise by your doctor.

## 2018-05-08 NOTE — Telephone Encounter (Signed)
Per Enid Derry in radiation, I have scheduled the patient to see Dr. Denman George in May. Appt scheduled and Enid Derry will contact the patient.

## 2018-05-08 NOTE — Progress Notes (Signed)
Radiation Oncology         [phone] ________________________________  Name: Toni Peterson MRN: [id number]  Date: 05/08/2018  DOB: [birth year]  Follow-Up Visit Note  CC: Antony Contras, MD  Everitt Amber, MD    ICD-10-CM   1. 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 01/16/18-02/21/18 (external beam)       2. 02/28/18, 03/07/18, 03/16/18, 03/20/18, 03/27/18 (brachytherapy treatments)  Site/dose:   1. Pelvis; 25 fractions of 1.8 Gy for a total of 45 Gy (including solitary bone metastasis at L5)           2. Cervix; 5 fractions of 5.5 Gy for a total of 27.5 Gy   Narrative:  The patient returns today for routine follow-up.                 On review of systems, pt reports intermittent upper abdominal pain and lower back/flank, more pronounced at night with pt recumbent. Pt reports pain keeps her awake at night. Pt denies dysuria/hematuria. Pt reports vaginal odor that is bothersome, pt describes as "foul". Pt reports that this past weekend, stools have become black. Pt reports ribbon-like stool for "a long time" and is usual for pt. Pt was given vaginal dilator education with good understanding.                  ALLERGIES:  has No Known Allergies.  Meds: Current Outpatient Medications  Medication Sig Dispense Refill  . amLODipine (NORVASC) 10 MG tablet Take 10 mg by mouth every evening.     Marland Kitchen atenolol (TENORMIN) 50 MG tablet Take 50 mg by mouth every evening.   1  . Cholecalciferol (VITAMIN D3) 5000 units CAPS Take 5,000 Units by mouth daily.     . fish oil-omega-3 fatty acids 1000 MG capsule Take 1 g by mouth daily.     . fluconazole  (DIFLUCAN) 150 MG tablet Take 150 mg by mouth daily.    . hydrocortisone (ANUSOL-HC) 2.5 % rectal cream Apply 1 application topically 2 (two) times daily. 28.35 g 0  . lidocaine-prilocaine (EMLA) cream Apply to affected area once 30 g 3  . LORazepam (ATIVAN) 0.5 MG tablet Take 1 tablet (0.5 mg total) by mouth every 6 (six) hours as needed (Nausea or vomiting). 30 tablet 0  . Multiple Vitamin (MULTIVITAMIN WITH MINERALS) TABS tablet Take 1 tablet by mouth daily.    . ondansetron (ZOFRAN) 8 MG tablet Take 1 tablet (8 mg total) by mouth 2 (two) times daily as needed. Start on the third day after chemotherapy. 30 tablet 1  . phenazopyridine (PYRIDIUM) 200 MG tablet Take 1 tablet (200 mg total) by mouth 3 (three) times daily as needed for pain. 25 tablet 0  . pramoxine (PROCTOFOAM) 1 % foam Place 1 application rectally 3 (three) times daily as needed for anal itching. 15 g 2  . prochlorperazine (COMPAZINE) 10 MG tablet Take 1 tablet (10 mg total) by mouth every 6 (six) hours as needed (Nausea or vomiting). 30 tablet 1  . spironolactone (ALDACTONE) 25 MG tablet Take 1 tablet (25 mg total) by  mouth daily. 90 tablet 3   No current facility-administered medications for this encounter.     Physical Findings: The patient is in no acute distress. Patient is alert and oriented.  height is 5\' 1"  (1.549 m) and weight is 175 lb 3.2 oz (79.5 kg). Her oral temperature is 98.4 F (36.9 C). Her blood pressure is 120/78 and her pulse is 70. Her respiration is 20 and oxygen saturation is 100%. .  No significant changes. Lungs are clear to auscultation bilaterally. Heart has regular rate and rhythm. No palpable cervical, supraclavicular, or axillary adenopathy. Abdomen no rebound or guarding, pt reports mild discomfort with palpation in the epigastric region.    Lab Findings: Lab Results  Component Value Date   WBC 7.1 05/08/2018   HGB 10.7 (L) 05/08/2018   HCT 33.5 (L) 05/08/2018   MCV 94.9 05/08/2018   PLT 397  05/08/2018    Radiographic Findings: No results found.  Impression:  The patient is recovering from the effects of radiation.  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 In light of her symptoms, she will be sent to th lab for CMP, CBC w/dif, magnesium, urinalysis culture and sensitivity.   Plan:  Pt will proceed to the lab as above. She has been given a vaginal dilator and instructions on its use. The pt is not really taking any pain medication for her pain which primarily occurs at night. I recommend she take two extra strength Tylenol and she will let us know if she would like anything stronger. She is hesitant to consider narcotics for pain control. She will proceed with PET scan 05/17/18 and follow up with Dr. Alvy Bimler soon after. Recommend she follow up with Dr. Denman George in May, and she will follow-up in radiation oncology in August. Depending on the results of her PET scan, she will likely need to see her general surgeon for management of her untreated colon cancer.   ____________________________________   Blair Promise, PhD, MD    This document serves as a record of services personally performed by Gery Pray, MD. It was created on his behalf by Mary-Margaret Loma Messing, a trained medical scribe. The creation of this record is based on the scribe's personal observations and the provider's statements to them. This document has been checked and approved by the attending provider.

## 2018-05-08 NOTE — Telephone Encounter (Signed)
Called patient to inform of fu with Dr. Denman George on 05-17-18 @ 3:15 pm, spoke with patient and she is aware of this appt.

## 2018-05-08 NOTE — Progress Notes (Signed)
Pt presents today for f/u with Dr. Sondra Come. Pt reports intermittent lower abdominal pain and lower back/flank, more pronounced at night with pt recumbent. Pt reports pain keeps her awake at night. Pt denies dysuria/hematuria. Pt reports vaginal odor that is bothersome, pt describes as "foul". Pt reports that this past weekend, stools have become black. Pt reports ribbon-like stool for "a long time" and is usual for pt. Pt was given vaginal dilator education with good understanding.   BP 120/78 (BP Location: Left Arm, Patient Position: Sitting)   Pulse 70   Temp 98.4 F (36.9 C) (Oral)   Resp 20   Ht 5\' 1"  (1.549 m)   Wt 175 lb 3.2 oz (79.5 kg)   SpO2 100%   BMI 33.10 kg/m   Wt Readings from Last 3 Encounters:  05/08/18 175 lb 3.2 oz (79.5 kg)  03/27/18 177 lb (80.3 kg)  03/24/18 178 lb 12.8 oz (81.1 kg)   Loma Sousa, RN BSN     Home Care Instructions for the Insertion and Care of Your Vaginal Dilator  Why Do I Need a Vaginal Dilator?  Internal radiation therapy may cause scar tissue to form at the top of your vagina (vaginal cuff).  This may make vaginal examinations difficult in the future. You can prevent scar tissue from forming by using a vaginal dilator (a smooth plastic rod), and/or by having regular sexual intercourse.  If not using the dilator you should be having intercourse two or three times a week.  If you are unable to have intercourse, you should use your vaginal dilator.  You may have some spotting or bleeding from your dilator or intercourse the first few times. You may also have some discomfort. If discomfort occurs with intercourse, you and your partner may need to stop for a while and try again later.  How to Use Your Vaginal Dilator  - Wash the dilator with soap and water before and after each use. - Check the dilator to be sure it is smooth. Do not use the dilator if you find any roughspots. - Coat the dilator with K-Y Jelly, Astroglide, or  Replens. Do not use Vaseline, baby oil, or other oil based lubricants. They are not water-soluble and can be irritating to the tissues in the vagina. - Lie on your back with your knees bent and legs apart. - Insert the rounded end of the dilator into your vagina as far as it will go without causing pain or discomfort. - Close your knees and slowly straighten your legs. - Keep the dilator in your vagina for about 10 to 15 minutes.  Please use 3 times a week, for example: Monday, Wednesday and Friday evenings. [facility] your knees, open your legs, and gently remove the dilator. - Gently cleanse the skin around the vaginal opening. - Wash the dilator after each use. -  It is important that you use the dilator routinely until instructed otherwise by your doctor.

## 2018-05-09 ENCOUNTER — Telehealth: Payer: Self-pay | Admitting: Hematology and Oncology

## 2018-05-09 LAB — URINE CULTURE

## 2018-05-09 NOTE — Telephone Encounter (Signed)
Scheduled appt per 3/30 sch message - unable to reach patient left message with appt date and time

## 2018-05-15 ENCOUNTER — Other Ambulatory Visit: Payer: BC Managed Care – PPO

## 2018-05-15 ENCOUNTER — Telehealth: Payer: Self-pay | Admitting: Oncology

## 2018-05-15 ENCOUNTER — Encounter (HOSPITAL_COMMUNITY): Payer: BC Managed Care – PPO

## 2018-05-15 NOTE — Telephone Encounter (Signed)
Called Ms. Pepper back and advised her that the lab and port flush apts on 05/17/18 have been canceled and confirmed the date on time of PET scan and follow up with Dr. Alvy Bimler.  She verbalized understanding and agreement.

## 2018-05-15 NOTE — Telephone Encounter (Signed)
Toni Peterson called and asked if she needs the lab and port flush on 05/17/18.  She said she had her labs drawn on 05/08/18.  She also had her blood drawn from her arm due to the shortage of port kits and was told that this would happen again on 05/17/18.

## 2018-05-15 NOTE — Telephone Encounter (Signed)
You may cancel labs and flush She has PET scan scheduled and then see me the next day

## 2018-05-16 ENCOUNTER — Ambulatory Visit: Payer: BC Managed Care – PPO | Admitting: Hematology and Oncology

## 2018-05-17 ENCOUNTER — Ambulatory Visit (HOSPITAL_COMMUNITY)
Admission: RE | Admit: 2018-05-17 | Discharge: 2018-05-17 | Disposition: A | Discharge status: Home / Self Care | Payer: BC Managed Care – PPO | Source: Ambulatory Visit | Attending: Hematology and Oncology | Admitting: Hematology and Oncology

## 2018-05-17 ENCOUNTER — Other Ambulatory Visit: Payer: Self-pay

## 2018-05-17 ENCOUNTER — Other Ambulatory Visit: Payer: BC Managed Care – PPO

## 2018-05-17 DIAGNOSIS — C530 Malignant neoplasm of endocervix: Secondary | ICD-10-CM

## 2018-05-17 DIAGNOSIS — N3000 Acute cystitis without hematuria: Secondary | ICD-10-CM

## 2018-05-17 LAB — GLUCOSE, CAPILLARY: Glucose-Capillary: 119 mg/dL — ABNORMAL HIGH (ref 70–99)

## 2018-05-17 MED ORDER — FLUDEOXYGLUCOSE F - 18 (FDG) INJECTION
8.4000 | Freq: Once | INTRAVENOUS | Status: AC | PRN
  Administered 2018-05-17: 8.4 via INTRAVENOUS

## 2018-05-18 ENCOUNTER — Encounter: Payer: Self-pay | Admitting: Hematology and Oncology

## 2018-05-18 ENCOUNTER — Telehealth: Payer: Self-pay | Admitting: Hematology and Oncology

## 2018-05-18 ENCOUNTER — Other Ambulatory Visit: Payer: Self-pay

## 2018-05-18 ENCOUNTER — Inpatient Hospital Stay: Payer: BC Managed Care – PPO | Attending: Hematology and Oncology | Admitting: Hematology and Oncology

## 2018-05-18 ENCOUNTER — Inpatient Hospital Stay: Payer: BC Managed Care – PPO

## 2018-05-18 VITALS — BP 145/78 | HR 84 | Temp 98.0°F | Resp 18 | Ht 61.0 in | Wt 171.8 lb

## 2018-05-18 DIAGNOSIS — Z452 Encounter for adjustment and management of vascular access device: Secondary | ICD-10-CM | POA: Insufficient documentation

## 2018-05-18 DIAGNOSIS — K5909 Other constipation: Secondary | ICD-10-CM | POA: Insufficient documentation

## 2018-05-18 DIAGNOSIS — C7951 Secondary malignant neoplasm of bone: Secondary | ICD-10-CM | POA: Diagnosis not present

## 2018-05-18 DIAGNOSIS — T451X5A Adverse effect of antineoplastic and immunosuppressive drugs, initial encounter: Secondary | ICD-10-CM

## 2018-05-18 DIAGNOSIS — C530 Malignant neoplasm of endocervix: Secondary | ICD-10-CM

## 2018-05-18 DIAGNOSIS — Z7189 Other specified counseling: Secondary | ICD-10-CM

## 2018-05-18 DIAGNOSIS — Z5111 Encounter for antineoplastic chemotherapy: Secondary | ICD-10-CM | POA: Insufficient documentation

## 2018-05-18 DIAGNOSIS — C539 Malignant neoplasm of cervix uteri, unspecified: Secondary | ICD-10-CM

## 2018-05-18 DIAGNOSIS — D6481 Anemia due to antineoplastic chemotherapy: Secondary | ICD-10-CM

## 2018-05-18 DIAGNOSIS — G4701 Insomnia due to medical condition: Secondary | ICD-10-CM | POA: Diagnosis not present

## 2018-05-18 DIAGNOSIS — G893 Neoplasm related pain (acute) (chronic): Secondary | ICD-10-CM | POA: Diagnosis not present

## 2018-05-18 DIAGNOSIS — K639 Disease of intestine, unspecified: Secondary | ICD-10-CM

## 2018-05-18 LAB — COMPREHENSIVE METABOLIC PANEL
ALT: 12 U/L (ref 0–44)
AST: 14 U/L — ABNORMAL LOW (ref 15–41)
Albumin: 3.1 g/dL — ABNORMAL LOW (ref 3.5–5.0)
Alkaline Phosphatase: 87 U/L (ref 38–126)
Anion gap: 12 (ref 5–15)
BUN: 13 mg/dL (ref 8–23)
CO2: 24 mmol/L (ref 22–32)
Calcium: 9.5 mg/dL (ref 8.9–10.3)
Chloride: 103 mmol/L (ref 98–111)
Creatinine, Ser: 0.74 mg/dL (ref 0.44–1.00)
GFR calc Af Amer: >60 mL/min (ref >60)
GFR calc non Af Amer: >60 mL/min (ref >60)
Glucose, Bld: 119 mg/dL — ABNORMAL HIGH (ref 70–99)
Potassium: 4 mmol/L (ref 3.5–5.1)
Sodium: 139 mmol/L (ref 135–145)
Total Bilirubin: 0.3 mg/dL (ref 0.3–1.2)
Total Protein: 7 g/dL (ref 6.5–8.1)

## 2018-05-18 LAB — CBC WITH DIFFERENTIAL/PLATELET
Abs Immature Granulocytes: 0.03 10*3/uL (ref 0.00–0.07)
Basophils Absolute: 0 10*3/uL (ref 0.0–0.1)
Basophils Relative: 0 %
Eosinophils Absolute: 0.1 10*3/uL (ref 0.0–0.5)
Eosinophils Relative: 1 %
HCT: 32.8 % — ABNORMAL LOW (ref 36.0–46.0)
Hemoglobin: 10.6 g/dL — ABNORMAL LOW (ref 12.0–15.0)
Immature Granulocytes: 0 %
Lymphocytes Relative: 5 %
Lymphs Abs: 0.4 10*3/uL — ABNORMAL LOW (ref 0.7–4.0)
MCH: 29.9 pg (ref 26.0–34.0)
MCHC: 32.3 g/dL (ref 30.0–36.0)
MCV: 92.7 fL (ref 80.0–100.0)
Monocytes Absolute: 0.5 10*3/uL (ref 0.1–1.0)
Monocytes Relative: 7 %
Neutro Abs: 6.7 10*3/uL (ref 1.7–7.7)
Neutrophils Relative %: 87 %
Platelets: 457 10*3/uL — ABNORMAL HIGH (ref 150–400)
RBC: 3.54 MIL/uL — ABNORMAL LOW (ref 3.87–5.11)
RDW: 15.1 % (ref 11.5–15.5)
WBC: 7.7 10*3/uL (ref 4.0–10.5)
nRBC: 0 % (ref 0.0–0.2)

## 2018-05-18 LAB — SAMPLE TO BLOOD BANK

## 2018-05-18 LAB — MAGNESIUM: Magnesium: 2.1 mg/dL (ref 1.7–2.4)

## 2018-05-18 MED ORDER — ONDANSETRON HCL 8 MG PO TABS: 8.0000 mg | ORAL_TABLET | Freq: Three times a day (TID) | ORAL | 1 refills | Status: DC | PRN

## 2018-05-18 MED ORDER — PROCHLORPERAZINE MALEATE 10 MG PO TABS: 10.0000 mg | ORAL_TABLET | Freq: Four times a day (QID) | ORAL | 1 refills | Status: AC | PRN

## 2018-05-18 MED ORDER — LIDOCAINE-PRILOCAINE 2.5-2.5 % EX CREA: TOPICAL_CREAM | CUTANEOUS | 3 refills | Status: DC

## 2018-05-18 MED ORDER — DEXAMETHASONE 4 MG PO TABS: ORAL_TABLET | ORAL | 0 refills | Status: DC

## 2018-05-18 NOTE — Progress Notes (Signed)
DISCONTINUE OFF PATHWAY REGIMEN - [Other Dx]   [id number]:Cisplatin 40 mg/m2 IV D1 q7 Days + RT:   A cycle is every 7 days:     Cisplatin   **Always confirm dose/schedule in your pharmacy ordering system**  REASON: Disease Progression PRIOR TREATMENT: Cisplatin 40 mg/m2 IV D1 q7 Days + RT TREATMENT RESPONSE: Progressive Disease (PD)  START OFF PATHWAY REGIMEN - Other Dx   [id number]:Carboplatin + Paclitaxel (5/175) q21 Days:   A cycle is every 21 days:     Paclitaxel      Carboplatin   **Always confirm dose/schedule in your pharmacy ordering system**  Patient Characteristics: Intent of Therapy: Non-Curative / Palliative Intent, Discussed with Patient

## 2018-05-18 NOTE — Assessment & Plan Note (Signed)
I reviewed multiple imaging studies with the patient She has evidence of diffuse metastatic cancer to her bone even though local disease control is great with recent concurrent chemoradiation therapy At this point in time, she understood that treatment goal is strictly palliative in nature I reviewed with her current guidelines and plan of care  We reviewed the NCCN guidelines We discussed the role of chemotherapy. The intent is of palliative intent.  We discussed some of the risks, benefits, side-effects of carboplatin & Taxol. Treatment is intravenous, every 3 weeks x 6 cycles  Some of the short term side-effects included, though not limited to, including weight loss, life threatening infections, risk of allergic reactions, need for transfusions of blood products, nausea, vomiting, change in bowel habits, loss of hair, admission to hospital for various reasons, and risks of death.   Long term side-effects are also discussed including risks of infertility, permanent damage to nerve function, hearing loss, chronic fatigue, kidney damage with possibility needing hemodialysis, and rare secondary malignancy including bone marrow disorders.  The patient is aware that the response rates discussed earlier is not guaranteed.  After a long discussion, patient made an informed decision to proceed with the prescribed plan of care.   Patient education material was dispensed. We discussed premedication with dexamethasone before chemotherapy. 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

## 2018-05-18 NOTE — Assessment & Plan Note (Signed)
The colon lesion is still hypermetabolic She is not a surgical candidate at this point We will continue conservative management

## 2018-05-18 NOTE — Assessment & Plan Note (Signed)
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

## 2018-05-18 NOTE — Telephone Encounter (Signed)
Left message re 4/15 and 5/6 appointments. Schedule mailed.

## 2018-05-18 NOTE — Assessment & Plan Note (Addendum)
Currently, she is not symptomatic She is at risk of worsening pancytopenia I plan AUC of 5 and paclitaxel at 101 mg per metered square

## 2018-05-18 NOTE — Progress Notes (Signed)
[place] OFFICE PROGRESS NOTE  Patient Care Team: Antony Contras, MD as PCP - General (Family Medicine) Minus Breeding, MD as PCP - Cardiology (Cardiology) Jacqulyn Liner, RN as Oncology Nurse Navigator (Oncology)  ASSESSMENT & PLAN:  Cervical cancer [facility]) I reviewed multiple imaging studies with the patient She has evidence of diffuse metastatic cancer to her bone even though local disease control is great with recent concurrent chemoradiation therapy At this point in time, she understood that treatment goal is strictly palliative in nature I reviewed with her current guidelines and plan of care  We reviewed the NCCN guidelines We discussed the role of chemotherapy. The intent is of palliative intent.  We discussed some of the risks, benefits, side-effects of carboplatin & Taxol. Treatment is intravenous, every 3 weeks x 6 cycles  Some of the short term side-effects included, though not limited to, including weight loss, life threatening infections, risk of allergic reactions, need for transfusions of blood products, nausea, vomiting, change in bowel habits, loss of hair, admission to hospital for various reasons, and risks of death.   Long term side-effects are also discussed including risks of infertility, permanent damage to nerve function, hearing loss, chronic fatigue, kidney damage with possibility needing hemodialysis, and rare secondary malignancy including bone marrow disorders.  The patient is aware that the response rates discussed earlier is not guaranteed.  After a long discussion, patient made an informed decision to proceed with the prescribed plan of care.   Patient education material was dispensed. We discussed premedication with dexamethasone before chemotherapy. She does not need prophylactic G-CSF support I do not plan to add bevacizumab due to risk of GI perforation and bowel obstruction with concurrent evidence of colon cancer and peritoneal  disease I recommend 3 months of treatment before repeat imaging study   Metastasis to bone [facility]) Currently, she is not symptomatic She is at risk of worsening pancytopenia I plan AUC of 5 and paclitaxel at 101 mg per metered square  Lesion of colon The colon lesion is still hypermetabolic She is not a surgical candidate at this point We will continue conservative management  Goals of care, counseling/discussion We had extensive discussions about goals of care She understood that treatment goal is strictly palliative in nature  Other constipation 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   INTERVAL HISTORY: Please see below for problem oriented charting. She returns to review test results Since last time I saw her, she feels well No residual peripheral neuropathy No bone pain She has some mild intermittent abdominal pain/discomfort She is mildly constipated  SUMMARY OF ONCOLOGIC HISTORY: Oncology History   MSI - Stable on cervix biopsy PD-L1 2%  Bone biopsy is consistent with metastatic GYN primary MMR protein MLH1 and PMS2 abnormalities noted on colon biopsy Genetics are neg     Cervical cancer ([place])   09/08/2017 Initial Diagnosis    Patient has a history of symptomatic endometrial fibroids in 2013 for which she underwent hysteroscopic resection with benign pathology.  She has had lifelong normal Pap smears including in 2019.  Of note HPV testing was not performed in this Pap in 2019.  She developed symptoms of very light vaginal spotting in August 2019     11/22/2017 Procedure    She was seen by her gynecologist, Dr. Radene Knee, who performed a pelvic examination.  On physical examination and endocervical mass was  appreciated and felt to be likely to be a polyp or fibroid was biopsied on 11/22/17.       11/22/2017 Pathology Results    This revealed poorly  differentiated invasive carcinoma.  Immunostains revealed that it was CK7 positive, P 16+, p53 positive, PAX 8+ and CK 5 6 weakly positive.  CK 20, ER PR and p53 immunostains were negative.  The morphology and Immuno profile favored a gynecologic primary with endocervical and endometrial adenocarcinoma in the differential diagnosis.     11/22/2017 Imaging    An ultrasound scan on November 22, 2017 revealed several intramural fibroids.  With saline infusion there was a 1.8 cm density within the endometrial cavity which could be a fibroid or polyp.     12/08/2017 Imaging    PET:  1. Marked hypermetabolism involving the cervix and uterus. Difficult to be certain of the origin but favor cervix. No evidence of parametrial or parauterine tumor and no pelvic or retroperitoneal hypermetabolic lymphadenopathy. 2. L5 vertebral body lesion worrisome for metastasis. 3. Hypermetabolic area involving the hepatic flexure region of the colon is worrisome for colon cancer. Recommend colonoscopy. 4. Benign left adrenal gland adenoma. 5. Symmetric hypermetabolism in the tongue base and tonsillar regions is likely inflamed lymphoid tissue. No neck mass or adenopathy.    12/19/2017 Imaging    1. Diffuse marrow replacement in the L5 vertebral body, indeterminate though metastatic disease is a concern. Correlate with upcoming biopsy. No extraosseous tumor or pathologic fracture. 2. Marrow signal abnormality on both sides of the T11-12 disc space favored to be degenerative. 3. Severe L4-5 facet arthrosis with grade 1 anterolisthesis, mild bilateral lateral recess stenosis, and mild-to-moderate bilateral neural foraminal stenosis. Findings may worsen with standing. 4. Moderate bilateral neural foraminal stenosis at L5-S1. 5. Partially visualized uterine masses which may reflect a combination of known adenocarcinoma and fibroids. 4 cm right adnexal mass may reflect an exophytic subserosal fibroid or ovarian neoplasm.     12/20/2017 Echocardiogram    LV EF: 60% -   65%    12/23/2017 Procedure    Successful placement of a right IJ approach Power Port with ultrasound and fluoroscopic guidance. The catheter is ready for use.    12/23/2017 Pathology Results    Bone, biopsy, L5 - METASTATIC POORLY DIFFERENTIATED CARCINOMA. - SEE COMMENT. Microscopic Comment Dr. Vic Ripper has reviewed the case and concurs with this interpretation. The case was discussed wtih Joylene John on 12/22/2017. Per request, a block will be sent for PDL1 testing and the results reported separately    01/11/2018 Cancer Staging    Staging form: Cervix Uteri, AJCC 8th Edition - Clinical: FIGO Stage IVB (cT2, cN0, pM1) - Signed by Heath Lark, MD on 01/11/2018    01/16/2018 - 03/27/2018 Radiation Therapy    Radiation treatment dates:   1. 01/16/18-02/21/18 (external beam)                                                   2. 02/28/18, 03/07/18, 03/16/18, 03/20/18, 03/27/18 (brachytherapy treatments)  Site/dose:   1. Pelvis; 25 fractions of 1.8 Gy for a total of 45 Gy (including solitary bone metastasis at L5)                      2. Cervix; 5 fractions of 5.5 Gy for a total of 27.5 Gy  Beams/energy:   1. Photon 3D; 15X                              2. HDR Ir-192 brachytherapy, tandem/ring system for treatment    01/17/2018 - 02/22/2018 Chemotherapy    The patient had weekly cisplatin    05/17/2018 PET scan    1. Response to therapy of cervical primary. Extensive pelvic edema and low-level hypermetabolism, likely radiation induced. New endocervical and endometrial air may relate to necrosis from radiation therapy. Endometritis cannot be excluded. 2. Given limitations of diffuse pelvic hypermetabolism, no hypermetabolic pelvic nodes identified. No evidence of distant soft tissue metastasis. 3. Progressive osseous metastasis. 4. Small volume abdominopelvic ascites with diffuse peritoneal/omental thickening, nonspecific. Recommend attention on follow-up  to exclude early omental/peritoneal metastasis. 5. Coronary artery atherosclerosis. Aortic Atherosclerosis (ICD10-I70.0).    05/24/2018 -  Chemotherapy    The patient had palonosetron (ALOXI) injection 0.25 mg, 0.25 mg, Intravenous,  Once, 0 of 6 cycles CARBOplatin (PARAPLATIN) 470 mg in sodium chloride 0.9 % 250 mL chemo infusion, 470 mg, Intravenous,  Once, 0 of 6 cycles PACLitaxel (TAXOL) 186 mg in sodium chloride 0.9 % 250 mL chemo infusion (> 19m/m2), 101.25 mg/m2 = 186 mg (75 % of original dose 135 mg/m2), Intravenous,  Once, 0 of 6 cycles Dose modification: 101.25 mg/m2 (75 % of original dose 135 mg/m2, Cycle 1, Reason: Provider Judgment) fosaprepitant (EMEND) 150 mg, dexamethasone (DECADRON) 12 mg in sodium chloride 0.9 % 145 mL IVPB, , Intravenous,  Once, 0 of 6 cycles  for chemotherapy treatment.      Metastasis to bone [place]   12/28/2017 Initial Diagnosis    [place]    05/24/2018 -  Chemotherapy    The patient had palonosetron (ALOXI) injection 0.25 mg, 0.25 mg, Intravenous,  Once, 0 of 6 cycles CARBOplatin (PARAPLATIN) 470 mg in sodium chloride 0.9 % 250 mL chemo infusion, 470 mg, Intravenous,  Once, 0 of 6 cycles PACLitaxel (TAXOL) 186 mg in sodium chloride 0.9 % 250 mL chemo infusion (> 811mm2), 101.25 mg/m2 = 186 mg (75 % of original dose 135 mg/m2), Intravenous,  Once, 0 of 6 cycles Dose modification: 101.25 mg/m2 (75 % of original dose 135 mg/m2, Cycle 1, Reason: Provider Judgment) fosaprepitant (EMEND) 150 mg, dexamethasone (DECADRON) 12 mg in sodium chloride 0.9 % 145 mL IVPB, , Intravenous,  Once, 0 of 6 cycles  for chemotherapy treatment.      [place]  01/11/2018 Procedure    - Hemorrhoids found on perianal exam. - Malignant partially obstructing tumor at the hepatic flexure. Biopsied. Tattooed. - Two 2 to 5 mm polyps in the sigmoid colon, removed with a cold snare. Resected and retrieved. - Diverticulosis in the sigmoid colon, in the  distal descending colon and in the ascending colon. - Non-bleeding internal hemorrhoids. - The examined portion of the ileum was normal.    01/11/2018 Pathology Results    1. Colon, biopsy, hepatic flexure - ADENOCARCINOMA, SEE COMMENT. 2. Colon, polyp(s), sigmoid, polyp (2) - HYPERPLASTIC POLYP (X2 FRAGMENTS). - NO DYSPLASIA OR MALIGNANCY. Microscopic Comment 1. There are additional fragments of tubular adenoma suggesting this is a primary colon adenocarcinoma.     02/07/2018 Cancer Staging    Staging form: Colon and Rectum, AJCC 8th Edition - Clinical: Stage I (cT1, cN0, cM0) - Signed by GoHeath LarkMD on 02/07/2018     Genetic Testing    Negative  genetic testing on the CancerNext panel.  The CancerNext gene panel offered by [employer] includes sequencing and rearrangement analysis for the following 34 genes:   APC, ATM, BARD1, BMPR1A, BRCA1, BRCA2, BRIP1, CDH1, CDK4, CDKN2A, CHEK2, DICER1, HOXB13, EPCAM, GREM1, MLH1, MRE11A, MSH2, MSH6, MUTYH, NBN, NF1, PALB2, PMS2, POLD1, POLE, PTEN, RAD50, RAD51C, RAD51D, SMAD4, SMARCA4, STK11, and TP53.  The report date is April 05, 2018.     REVIEW OF SYSTEMS:   Constitutional: Denies fevers, chills or abnormal weight loss Eyes: Denies blurriness of vision Ears, nose, mouth, throat, and face: Denies mucositis or sore throat Respiratory: Denies cough, dyspnea or wheezes Cardiovascular: Denies palpitation, chest discomfort or lower extremity swelling Skin: Denies abnormal skin rashes Lymphatics: Denies new lymphadenopathy or easy bruising Neurological:Denies numbness, tingling or new weaknesses Behavioral/Psych: Mood is stable, no new changes  All other systems were reviewed with the patient and are negative.  I have reviewed the past medical history, past surgical history, social history and family history with the patient and they are unchanged from previous note.  ALLERGIES:  has No Known Allergies.  MEDICATIONS:  Current  Outpatient Medications  Medication Sig Dispense Refill  . amLODipine (NORVASC) 10 MG tablet Take 10 mg by mouth every evening.     Marland Kitchen atenolol (TENORMIN) 50 MG tablet Take 50 mg by mouth every evening.   1  . Cholecalciferol (VITAMIN D3) 5000 units CAPS Take 5,000 Units by mouth daily.     Marland Kitchen dexamethasone (DECADRON) 4 MG tablet Take 3 tabs at the night before and 3 tab the morning of chemotherapy, every 3 weeks, by mouth 36 tablet 0  . fish oil-omega-3 fatty acids 1000 MG capsule Take 1 g by mouth daily.     . hydrocortisone (ANUSOL-HC) 2.5 % rectal cream Apply 1 application topically 2 (two) times daily. 28.35 g 0  . lidocaine-prilocaine (EMLA) cream Apply to affected area once 30 g 3  . lidocaine-prilocaine (EMLA) cream Apply to affected area once 30 g 3  . Multiple Vitamin (MULTIVITAMIN WITH MINERALS) TABS tablet Take 1 tablet by mouth daily.    . ondansetron (ZOFRAN) 8 MG tablet Take 1 tablet (8 mg total) by mouth every 8 (eight) hours as needed. Start on the third day after chemotherapy. 30 tablet 1  . pramoxine (PROCTOFOAM) 1 % foam Place 1 application rectally 3 (three) times daily as needed for anal itching. 15 g 2  . prochlorperazine (COMPAZINE) 10 MG tablet Take 1 tablet (10 mg total) by mouth every 6 (six) hours as needed (Nausea or vomiting). 30 tablet 1   No current facility-administered medications for this visit.     PHYSICAL EXAMINATION: ECOG PERFORMANCE STATUS: 1 - Symptomatic but completely ambulatory  Vitals:   05/18/18 1130  BP: (!) 145/78  Pulse: 84  Resp: 18  Temp: 98 F (36.7 C)  SpO2: 100%   Filed Weights   05/18/18 1130  Weight: 171 lb 12.8 oz (77.9 kg)    GENERAL:alert, no distress and comfortable Musculoskeletal:no cyanosis of digits and no clubbing  NEURO: alert & oriented x 3 with fluent speech, no focal motor/sensory deficits  LABORATORY DATA:  I have reviewed the data as listed    Component Value Date/Time   NA 139 05/18/2018 1225   K 4.0  05/18/2018 1225   CL 103 05/18/2018 1225   CO2 24 05/18/2018 1225   GLUCOSE 119 (H) 05/18/2018 1225   BUN 13 05/18/2018 1225   CREATININE 0.74 05/18/2018 1225   CREATININE 0.81  05/08/2018 1135   CALCIUM 9.5 05/18/2018 1225   PROT 7.0 05/18/2018 1225   ALBUMIN 3.1 (L) 05/18/2018 1225   AST 14 (L) 05/18/2018 1225   AST 12 (L) 05/08/2018 1135   ALT 12 05/18/2018 1225   ALT 12 05/08/2018 1135   ALKPHOS 87 05/18/2018 1225   BILITOT 0.3 05/18/2018 1225   BILITOT 0.4 05/08/2018 1135   GFRNONAA >60 05/18/2018 1225   GFRNONAA >60 05/08/2018 1135   GFRAA >60 05/18/2018 1225   GFRAA >60 05/08/2018 1135    No results found for: SPEP, UPEP  Lab Results  Component Value Date   WBC 7.7 05/18/2018   NEUTROABS 6.7 05/18/2018   HGB 10.6 (L) 05/18/2018   HCT 32.8 (L) 05/18/2018   MCV 92.7 05/18/2018   PLT 457 (H) 05/18/2018      Chemistry      Component Value Date/Time   NA 139 05/18/2018 1225   K 4.0 05/18/2018 1225   CL 103 05/18/2018 1225   CO2 24 05/18/2018 1225   BUN 13 05/18/2018 1225   CREATININE 0.74 05/18/2018 1225   CREATININE 0.81 05/08/2018 1135      Component Value Date/Time   CALCIUM 9.5 05/18/2018 1225   ALKPHOS 87 05/18/2018 1225   AST 14 (L) 05/18/2018 1225   AST 12 (L) 05/08/2018 1135   ALT 12 05/18/2018 1225   ALT 12 05/08/2018 1135   BILITOT 0.3 05/18/2018 1225   BILITOT 0.4 05/08/2018 1135       RADIOGRAPHIC STUDIES: I have reviewed multiple imaging studies with the patient I have personally reviewed the radiological images as listed and agreed with the findings in the report. Nm Pet Image Restag (ps) Skull Base To Thigh  Result Date: 05/17/2018 CLINICAL DATA:  Staging treatment strategy for cervical cancer. Paraaortic lymph node positive. Status post radiation therapy including through 03/27/2018. EXAM: NUCLEAR MEDICINE PET SKULL BASE TO THIGH TECHNIQUE: 8.4 mCi F-18 FDG was injected intravenously. Full-ring PET imaging was performed from the skull  base to thigh after the radiotracer. CT data was obtained and used for attenuation correction and anatomic localization. Fasting blood glucose: 119 mg/dl COMPARISON:  12/08/2017 FINDINGS: Mediastinal blood pool activity: SUV max 2.4 NECK: Insert activity Incidental CT findings: No cervical adenopathy. Soft tissue fullness and heterogeneity within the thyroid likely relates to underlying multinodular goiter. CHEST: No pulmonary parenchymal or thoracic nodal hypermetabolism. Incidental CT findings: Right Port-A-Cath tip at high right atrium. Mild cardiomegaly. Lad coronary artery calcification. Bibasilar subsegmental atelectasis or scar. ABDOMEN/PELVIS: Focal hepatic flexure colonic wall hypermetabolism again identified. This measures on the order of a S.U.V. max of 15.1 on image 110/4. Compare a S.U.V. max of 16.6 on the prior. No abdominal nodal hypermetabolism. The previous hypermetabolism at the uterine cervix has improved. Example today at a S.U.V. max of 6.0 versus a S.U.V. max of 19.6 on the prior. Surrounding the uterus and throughout the pelvis, there is edema and low-level hypermetabolism. This is likely radiation induced. Given this limitation, no cervical nodal hypermetabolism is seen. Incidental CT findings: Air within the endocervix and endometrium may relate to necrosis of radiation therapy. Example image 148/4. Abdominal aortic atherosclerosis. New small volume abdominopelvic ascites. New omental thickening, without well-defined dominant omental mass. SKELETON: Improvement in L5 hypermetabolism. New foci of hypermetabolism within the L1 and L2 vertebral bodies. An index area of hypermetabolism within the T10 vertebral body measures a S.U.V. max of 6.8. There is also a new right humeral shaft hypermetabolic focus at a S.U.V. max  of 6.4. Incidental CT findings: none IMPRESSION: 1. Response to therapy of cervical primary. Extensive pelvic edema and low-level hypermetabolism, likely radiation induced. New  endocervical and endometrial air may relate to necrosis from radiation therapy. Endometritis cannot be excluded. 2. Given limitations of diffuse pelvic hypermetabolism, no hypermetabolic pelvic nodes identified. No evidence of distant soft tissue metastasis. 3. Progressive osseous metastasis. 4. Small volume abdominopelvic ascites with diffuse peritoneal/omental thickening, nonspecific. Recommend attention on follow-up to exclude early omental/peritoneal metastasis. 5. Coronary artery atherosclerosis. Aortic Atherosclerosis (ICD10-I70.0). Electronically Signed   By: Abigail Miyamoto M.D.   On: 05/17/2018 16:26    All questions were answered. The patient knows to call the clinic with any problems, questions or concerns. No barriers to learning was detected.  I spent 40 minutes counseling the patient face to face. The total time spent in the appointment was 55 minutes and more than 50% was on counseling and review of test results  Heath Lark, MD 05/18/2018 1:13 PM

## 2018-05-18 NOTE — Assessment & Plan Note (Signed)
We had extensive discussions about goals of care She understood that treatment goal is strictly palliative in nature

## 2018-05-22 ENCOUNTER — Telehealth: Payer: Self-pay | Admitting: Oncology

## 2018-05-22 NOTE — Telephone Encounter (Signed)
Left a message for Toni Peterson advising her that the follow up with Dr. Denman George for 06/15/18 has been canceled.

## 2018-05-24 ENCOUNTER — Inpatient Hospital Stay: Payer: BC Managed Care – PPO

## 2018-05-24 ENCOUNTER — Other Ambulatory Visit: Payer: Self-pay

## 2018-05-24 VITALS — BP 148/74 | HR 63 | Temp 98.3°F | Resp 20 | Wt 171.8 lb

## 2018-05-24 DIAGNOSIS — Z7189 Other specified counseling: Secondary | ICD-10-CM

## 2018-05-24 DIAGNOSIS — Z95828 Presence of other vascular implants and grafts: Secondary | ICD-10-CM

## 2018-05-24 DIAGNOSIS — C530 Malignant neoplasm of endocervix: Secondary | ICD-10-CM

## 2018-05-24 DIAGNOSIS — Z5111 Encounter for antineoplastic chemotherapy: Secondary | ICD-10-CM | POA: Diagnosis not present

## 2018-05-24 DIAGNOSIS — C7951 Secondary malignant neoplasm of bone: Secondary | ICD-10-CM

## 2018-05-24 MED ORDER — DIPHENHYDRAMINE HCL 50 MG/ML IJ SOLN
50.0000 mg | Freq: Once | INTRAMUSCULAR | Status: AC
  Administered 2018-05-24: 50 mg via INTRAVENOUS

## 2018-05-24 MED ORDER — SODIUM CHLORIDE 0.9 % IV SOLN
Freq: Once | INTRAVENOUS | Status: AC
  Administered 2018-05-24: 10:00:00 via INTRAVENOUS
  Filled 2018-05-24: qty 250

## 2018-05-24 MED ORDER — SODIUM CHLORIDE 0.9 % IV SOLN
101.2500 mg/m2 | Freq: Once | INTRAVENOUS | Status: AC
  Administered 2018-05-24: 12:00:00 186 mg via INTRAVENOUS
  Filled 2018-05-24: qty 31

## 2018-05-24 MED ORDER — ALTEPLASE 2 MG IJ SOLR
2.0000 mg | Freq: Once | INTRAMUSCULAR | Status: AC
  Administered 2018-05-24: 11:00:00 2 mg
  Filled 2018-05-24: qty 2

## 2018-05-24 MED ORDER — DIPHENHYDRAMINE HCL 50 MG/ML IJ SOLN
INTRAMUSCULAR | Status: AC
  Filled 2018-05-24: qty 1

## 2018-05-24 MED ORDER — ALTEPLASE 2 MG IJ SOLR
INTRAMUSCULAR | Status: AC
  Filled 2018-05-24: qty 2

## 2018-05-24 MED ORDER — SODIUM CHLORIDE 0.9% FLUSH
10.0000 mL | INTRAVENOUS | Status: DC | PRN
  Administered 2018-05-24: 10 mL
  Filled 2018-05-24: qty 10

## 2018-05-24 MED ORDER — PALONOSETRON HCL INJECTION 0.25 MG/5ML
INTRAVENOUS | Status: AC
  Filled 2018-05-24: qty 5

## 2018-05-24 MED ORDER — ALTEPLASE 2 MG IJ SOLR
2.0000 mg | Freq: Once | INTRAMUSCULAR | Status: AC | PRN
  Administered 2018-05-24: 09:00:00 2 mg
  Filled 2018-05-24: qty 2

## 2018-05-24 MED ORDER — PALONOSETRON HCL INJECTION 0.25 MG/5ML
0.2500 mg | Freq: Once | INTRAVENOUS | Status: AC
  Administered 2018-05-24: 0.25 mg via INTRAVENOUS

## 2018-05-24 MED ORDER — SODIUM CHLORIDE 0.9 % IV SOLN
465.5000 mg | Freq: Once | INTRAVENOUS | Status: AC
  Administered 2018-05-24: 470 mg via INTRAVENOUS
  Filled 2018-05-24: qty 47

## 2018-05-24 MED ORDER — SODIUM CHLORIDE 0.9 % IV SOLN
20.0000 mg | Freq: Once | INTRAVENOUS | Status: AC
  Administered 2018-05-24: 10:00:00 20 mg via INTRAVENOUS
  Filled 2018-05-24: qty 2

## 2018-05-24 MED ORDER — SODIUM CHLORIDE 0.9 % IV SOLN
Freq: Once | INTRAVENOUS | Status: AC
  Administered 2018-05-24: 10:00:00 via INTRAVENOUS
  Filled 2018-05-24: qty 5

## 2018-05-24 MED ORDER — HEPARIN SOD (PORK) LOCK FLUSH 100 UNIT/ML IV SOLN
500.0000 [IU] | Freq: Once | INTRAVENOUS | Status: AC | PRN
  Administered 2018-05-24: 14:00:00 500 [IU]
  Filled 2018-05-24: qty 5

## 2018-05-24 NOTE — Progress Notes (Signed)
Pt received cathflow x2.  After several hours, blood return noted.  Pt received chemo peripherally.  Dr. Alvy Bimler aware.

## 2018-05-24 NOTE — Progress Notes (Signed)
Ok to treat with 4/9 labs per Dr. Alvy Bimler

## 2018-05-24 NOTE — Patient Instructions (Addendum)
[place] Discharge Instructions for Patients Receiving Chemotherapy  Today you received the following chemotherapy agents taxol/carboplatin   To help prevent nausea and vomiting after your treatment, we encourage you to take your nausea medication as directed DO NOT [place]  If you develop nausea and vomiting that is not controlled by your nausea medication, call the clinic.   BELOW ARE SYMPTOMS THAT SHOULD BE REPORTED IMMEDIATELY:  *FEVER GREATER THAN 100.5 F  *CHILLS WITH OR WITHOUT FEVER  NAUSEA AND VOMITING THAT IS NOT CONTROLLED WITH YOUR NAUSEA MEDICATION  *UNUSUAL SHORTNESS OF BREATH  *UNUSUAL BRUISING OR BLEEDING  TENDERNESS IN MOUTH AND THROAT WITH OR WITHOUT PRESENCE OF ULCERS  *URINARY PROBLEMS  *BOWEL PROBLEMS  UNUSUAL RASH Items with * indicate a potential emergency and should be followed up as soon as possible.  Feel free to call the clinic you have any questions or concerns. The clinic phone number is [phone].

## 2018-05-25 ENCOUNTER — Other Ambulatory Visit: Payer: Self-pay | Admitting: Hematology and Oncology

## 2018-05-25 ENCOUNTER — Telehealth: Payer: Self-pay

## 2018-05-25 NOTE — Telephone Encounter (Signed)
Called after first treatment yesterday. She is doing well with no complaints. Eating and drinking with no problems. Denies constipation, nausea and vomiting. She requested a refill on Ativan yesterday. Per Dr. Alvy Bimler she did not give her any prescription of Ativan. Ms. Bugh looked at the bottle and the Rx is from Dr. Maylon Peppers, who she saw when Dr. Alvy Bimler was out of the office She is taking it at night to help her sleep because that is the way she thought she should take it. Instructed her Dr. Alvy Bimler does not recommend it due to high risk of dependency. Ask her to call the office if need, she verbalized understanding.

## 2018-05-25 NOTE — Telephone Encounter (Signed)
-----   Message from Arty Baumgartner, RN sent at 05/24/2018  4:05 PM EDT ----- Regarding: Alvy Bimler, first chemo First time taxol/carbo.  Tolerated well.  Dr. Alvy Bimler.

## 2018-05-26 ENCOUNTER — Other Ambulatory Visit: Payer: Self-pay | Admitting: Hematology

## 2018-05-26 DIAGNOSIS — C530 Malignant neoplasm of endocervix: Secondary | ICD-10-CM

## 2018-05-26 MED ORDER — MORPHINE SULFATE 15 MG PO TABS: 15.0000 mg | ORAL_TABLET | Freq: Four times a day (QID) | ORAL | 0 refills | Status: AC | PRN

## 2018-05-28 ENCOUNTER — Encounter: Payer: Self-pay | Admitting: Hematology and Oncology

## 2018-05-29 ENCOUNTER — Telehealth: Payer: Self-pay | Admitting: Oncology

## 2018-05-29 NOTE — Telephone Encounter (Signed)
Called Toni Peterson back with message from Dr. Alvy Bimler.  Discussed apt at 3 pm tomorrow.  She agreed to start the pain journal and is currently taking 1 dose of Miralax per day.  If constipation increases she will take 2 doses of Miralax per day.  She did mention that she is only talking 1 tablet of morphine per day as needed and only helps if she takes tylenol and ibuprofen along with it.

## 2018-05-29 NOTE — Telephone Encounter (Signed)
I can see her tomorrow at 3 pm, 30 mins I advise her to start a journal and document her pain score (from 0-10) and when she takes her pain medication Also, warn her about risk of constipation

## 2018-05-29 NOTE — Telephone Encounter (Signed)
Called Ms. Toni Peterson and advised her of message from Dr. Alvy Bimler. Advised her to call with any urgent issues.  She verbalized agreement and said her pain is a little better.  She took tylenol, ibuprofen and morphine this morning and her pain is now a 5/10.  Discussed that she should stagger the tylenol and ibuprofen if possible.   She said that she is not able to come in at 11:30 today because she has a work [employer].  She is wondering if Dr. Alvy Bimler could call her or if she would be able to schedule an appointment for tomorrow afternoon.

## 2018-05-30 ENCOUNTER — Encounter: Payer: Self-pay | Admitting: Hematology and Oncology

## 2018-05-30 ENCOUNTER — Inpatient Hospital Stay (HOSPITAL_BASED_OUTPATIENT_CLINIC_OR_DEPARTMENT_OTHER): Payer: BC Managed Care – PPO | Admitting: Hematology and Oncology

## 2018-05-30 ENCOUNTER — Other Ambulatory Visit: Payer: Self-pay

## 2018-05-30 DIAGNOSIS — K5909 Other constipation: Secondary | ICD-10-CM | POA: Diagnosis not present

## 2018-05-30 DIAGNOSIS — C7951 Secondary malignant neoplasm of bone: Secondary | ICD-10-CM | POA: Diagnosis not present

## 2018-05-30 DIAGNOSIS — G893 Neoplasm related pain (acute) (chronic): Secondary | ICD-10-CM | POA: Diagnosis not present

## 2018-05-30 DIAGNOSIS — G4701 Insomnia due to medical condition: Secondary | ICD-10-CM

## 2018-05-30 DIAGNOSIS — C530 Malignant neoplasm of endocervix: Secondary | ICD-10-CM

## 2018-05-30 DIAGNOSIS — C539 Malignant neoplasm of cervix uteri, unspecified: Secondary | ICD-10-CM

## 2018-05-30 NOTE — Assessment & Plan Note (Signed)
I reviewed the imaging study again with the patient Her vaginal discharge is likely due to necrotic tumor For now, I do not recommend antibiotic therapy unless she had fever We will continue supportive care

## 2018-05-30 NOTE — Progress Notes (Signed)
[facility] OFFICE PROGRESS NOTE  Patient Care Team: Toni Contras, MD as PCP - General (Family Medicine) Toni Breeding, MD as PCP - Cardiology (Cardiology) Toni Mink Craige Cotta, RN as Oncology Nurse Navigator (Oncology)  ASSESSMENT & PLAN:  Cervical cancer Toni Peterson) I reviewed the imaging study again with the patient Her vaginal discharge is likely due to necrotic tumor For now, I do not recommend antibiotic therapy unless she had fever We will continue supportive care  Cancer associated pain We have extensive discussion about the causes of her abdominal pain It is multifactorial, likely due to intra-abdominal disease and constipation We discussed pain management The patient is somewhat reluctant to take pain medicine because of fear of addiction I reassured the patient that she will likely be successfully tapered once her chemotherapy starts working   Other constipation We have extensive discussion about aggressive laxative therapy  Insomnia due to medical condition The cause of her insomnia is multifactorial We discussed conservative management with Tylenol/Benadryl I suspect her pain probably contributed to her poor sleep I do not recommend benzodiazepine due to risk of dependency If Tylenol/Benadryl is not helpful, we can consider mirtazapine   No orders of the defined types were placed in this encounter.   INTERVAL HISTORY: Please see below for problem oriented charting. She is seen urgently today to address pain management and abnormal vaginal discharge along with recent changes in bowel habits She has received chemotherapy recently Last week, she developed significant, severe generalized abdominal pain that comes and goes Her pain is described as crampy in nature She had associated significant constipation She denies nausea or vomiting Her intake is poor due to her symptoms For the first few days after chemotherapy, she feels fine.  After that, she  complained of fatigue and loss of appetite Her family members were concerned about her symptom and contacted the physician on-call Immediate release morphine was prescribed She took several doses but then due to fear of addiction and constipation, she stopped taking morphine sulfate She also complained of insomnia Her vaginal discharge is constant since chemotherapy She denies fever or chills Denies recent changes in urination After several days of constipation, with aggressive laxatives, she is finally have 2 bowel movement today. She is exhausted  SUMMARY OF ONCOLOGIC HISTORY: Oncology History   MSI - Stable on cervix biopsy PD-L1 2%  Bone biopsy is consistent with metastatic GYN primary MMR protein MLH1 and PMS2 abnormalities noted on colon biopsy Genetics are neg     Cervical cancer ([facility])   09/08/2017 Initial Diagnosis    Patient has a history of symptomatic endometrial fibroids in 2013 for which she underwent hysteroscopic resection with benign pathology.  She has had lifelong normal Pap smears including in 2019.  Of note HPV testing was not performed in this Pap in 2019.  She developed symptoms of very light vaginal spotting in August 2019     11/22/2017 Procedure    She was seen by her gynecologist, Toni Peterson, who performed a pelvic examination.  On physical examination and endocervical mass was appreciated and felt to be likely to be a polyp or fibroid was biopsied on 11/22/17.       11/22/2017 Pathology Results    This revealed poorly differentiated invasive carcinoma.  Immunostains revealed that it was CK7 positive, P 16+, p53 positive, PAX 8+ and CK 5 6 weakly positive.  CK 20, ER PR and p53 immunostains were negative.  The morphology and Immuno profile favored a gynecologic primary with  endocervical and endometrial adenocarcinoma in the differential diagnosis.     11/22/2017 Imaging    An ultrasound scan on November 22, 2017 revealed several intramural fibroids.  With  saline infusion there was a 1.8 cm density within the endometrial cavity which could be a fibroid or polyp.     12/08/2017 Imaging    PET:  1. Marked hypermetabolism involving the cervix and uterus. Difficult to be certain of the origin but favor cervix. No evidence of parametrial or parauterine tumor and no pelvic or retroperitoneal hypermetabolic lymphadenopathy. 2. L5 vertebral body lesion worrisome for metastasis. 3. Hypermetabolic area involving the hepatic flexure region of the colon is worrisome for colon cancer. Recommend colonoscopy. 4. Benign left adrenal gland adenoma. 5. Symmetric hypermetabolism in the tongue base and tonsillar regions is likely inflamed lymphoid tissue. No neck mass or adenopathy.    12/19/2017 Imaging    1. Diffuse marrow replacement in the L5 vertebral body, indeterminate though metastatic disease is a concern. Correlate with upcoming biopsy. No extraosseous tumor or pathologic fracture. 2. Marrow signal abnormality on both sides of the T11-12 disc space favored to be degenerative. 3. Severe L4-5 facet arthrosis with grade 1 anterolisthesis, mild bilateral lateral recess stenosis, and mild-to-moderate bilateral neural foraminal stenosis. Findings may worsen with standing. 4. Moderate bilateral neural foraminal stenosis at L5-S1. 5. Partially visualized uterine masses which may reflect a combination of known adenocarcinoma and fibroids. 4 cm right adnexal mass may reflect an exophytic subserosal fibroid or ovarian neoplasm.    12/20/2017 Echocardiogram    LV EF: 60% -   65%    12/23/2017 Procedure    Successful placement of a right IJ approach Power Port with ultrasound and fluoroscopic guidance. The catheter is ready for use.    12/23/2017 Pathology Results    Bone, biopsy, L5 - METASTATIC POORLY DIFFERENTIATED CARCINOMA. - SEE COMMENT. Microscopic Comment Dr. Vic Ripper has reviewed the case and concurs with this interpretation. The case was discussed wtih  Toni Peterson on 12/22/2017. Per request, a block will be sent for PDL1 testing and the results reported separately    01/11/2018 Cancer Staging    Staging form: Cervix Uteri, AJCC 8th Edition - Clinical: FIGO Stage IVB (cT2, cN0, pM1) - Signed by Heath Lark, MD on 01/11/2018    01/16/2018 - 03/27/2018 Radiation Therapy    Radiation treatment dates:   1. 01/16/18-02/21/18 (external beam)                                                   2. 02/28/18, 03/07/18, 03/16/18, 03/20/18, 03/27/18 (brachytherapy treatments)  Site/dose:   1. Pelvis; 25 fractions of 1.8 Gy for a total of 45 Gy (including solitary bone metastasis at L5)                      2. Cervix; 5 fractions of 5.5 Gy for a total of 27.5 Gy  Beams/energy:   1. Photon 3D; 15X                              2. HDR Ir-192 brachytherapy, tandem/ring system for treatment    01/17/2018 - 02/22/2018 Chemotherapy    The patient had weekly cisplatin    05/17/2018 PET scan    1. Response to therapy of cervical  primary. Extensive pelvic edema and low-level hypermetabolism, likely radiation induced. New endocervical and endometrial air may relate to necrosis from radiation therapy. Endometritis cannot be excluded. 2. Given limitations of diffuse pelvic hypermetabolism, no hypermetabolic pelvic nodes identified. No evidence of distant soft tissue metastasis. 3. Progressive osseous metastasis. 4. Small volume abdominopelvic ascites with diffuse peritoneal/omental thickening, nonspecific. Recommend attention on follow-up to exclude early omental/peritoneal metastasis. 5. Coronary artery atherosclerosis. Aortic Atherosclerosis (ICD10-I70.0).    05/24/2018 -  Chemotherapy    The patient had palonosetron (ALOXI) injection 0.25 mg, 0.25 mg, Intravenous,  Once, 1 of 6 cycles Administration: 0.25 mg (05/24/2018) CARBOplatin (PARAPLATIN) 470 mg in sodium chloride 0.9 % 250 mL chemo infusion, 470 mg, Intravenous,  Once, 1 of 6 cycles Administration: 470 mg  (05/24/2018) PACLitaxel (TAXOL) 186 mg in sodium chloride 0.9 % 250 mL chemo infusion (> '80mg'$ /m2), 101.25 mg/m2 = 186 mg (75 % of original dose 135 mg/m2), Intravenous,  Once, 1 of 6 cycles Dose modification: 101.25 mg/m2 (75 % of original dose 135 mg/m2, Cycle 1, Reason: Provider Judgment) Administration: 186 mg (05/24/2018) fosaprepitant (EMEND) 150 mg, dexamethasone (DECADRON) 12 mg in sodium chloride 0.9 % 145 mL IVPB, , Intravenous,  Once, 1 of 6 cycles Administration:  (05/24/2018)  for chemotherapy treatment.      Metastasis to bone ([place])   12/28/2017 Initial Diagnosis    Metastasis to bone ([place])    05/24/2018 -  Chemotherapy    The patient had palonosetron (ALOXI) injection 0.25 mg, 0.25 mg, Intravenous,  Once, 1 of 6 cycles Administration: 0.25 mg (05/24/2018) CARBOplatin (PARAPLATIN) 470 mg in sodium chloride 0.9 % 250 mL chemo infusion, 470 mg, Intravenous,  Once, 1 of 6 cycles Administration: 470 mg (05/24/2018) PACLitaxel (TAXOL) 186 mg in sodium chloride 0.9 % 250 mL chemo infusion (> '80mg'$ /m2), 101.25 mg/m2 = 186 mg (75 % of original dose 135 mg/m2), Intravenous,  Once, 1 of 6 cycles Dose modification: 101.25 mg/m2 (75 % of original dose 135 mg/m2, Cycle 1, Reason: Provider Judgment) Administration: 186 mg (05/24/2018) fosaprepitant (EMEND) 150 mg, dexamethasone (DECADRON) 12 mg in sodium chloride 0.9 % 145 mL IVPB, , Intravenous,  Once, 1 of 6 cycles Administration:  (05/24/2018)  for chemotherapy treatment.      Colon cancer ([place])   01/11/2018 Procedure    - Hemorrhoids found on perianal exam. - Malignant partially obstructing tumor at the hepatic flexure. Biopsied. Tattooed. - Two 2 to 5 mm polyps in the sigmoid colon, removed with a cold snare. Resected and retrieved. - Diverticulosis in the sigmoid colon, in the distal descending colon and in the ascending colon. - Non-bleeding internal hemorrhoids. - The examined portion of the ileum was normal.    01/11/2018 Pathology  Results    1. Colon, biopsy, hepatic flexure - ADENOCARCINOMA, SEE COMMENT. 2. Colon, polyp(s), sigmoid, polyp (2) - HYPERPLASTIC POLYP (X2 FRAGMENTS). - NO DYSPLASIA OR MALIGNANCY. Microscopic Comment 1. There are additional fragments of tubular adenoma suggesting this is a primary colon adenocarcinoma.     02/07/2018 Cancer Staging    Staging form: Colon and Rectum, AJCC 8th Edition - Clinical: Stage I (cT1, cN0, cM0) - Signed by Heath Lark, MD on 02/07/2018     Genetic Testing    Negative genetic testing on the CancerNext panel.  The CancerNext gene panel offered by [employer] includes sequencing and rearrangement analysis for the following 34 genes:   APC, ATM, BARD1, BMPR1A, BRCA1, BRCA2, BRIP1, CDH1, CDK4, CDKN2A, CHEK2, DICER1, HOXB13, EPCAM, GREM1, MLH1, MRE11A,  MSH2, MSH6, MUTYH, NBN, NF1, PALB2, PMS2, POLD1, POLE, PTEN, RAD50, RAD51C, RAD51D, SMAD4, SMARCA4, STK11, and TP53.  The report date is April 05, 2018.     REVIEW OF SYSTEMS:   Constitutional: Denies fevers, chills  Eyes: Denies blurriness of vision Ears, nose, mouth, throat, and face: Denies mucositis or sore throat Respiratory: Denies cough, dyspnea or wheezes Cardiovascular: Denies palpitation, chest discomfort or lower extremity swelling Skin: Denies abnormal skin rashes Lymphatics: Denies new lymphadenopathy or easy bruising Behavioral/Psych: Mood is stable, no new changes  All other systems were reviewed with the patient and are negative.  I have reviewed the past medical history, past surgical history, social history and family history with the patient and they are unchanged from previous note.  ALLERGIES:  has No Known Allergies.  MEDICATIONS:  Current Outpatient Medications  Medication Sig Dispense Refill  . amLODipine (NORVASC) 10 MG tablet Take 10 mg by mouth every evening.     Marland Kitchen atenolol (TENORMIN) 50 MG tablet Take 50 mg by mouth every evening.   1  . Cholecalciferol (VITAMIN D3) 5000 units  CAPS Take 5,000 Units by mouth daily.     Marland Kitchen dexamethasone (DECADRON) 4 MG tablet Take 3 tabs at the night before and 3 tab the morning of chemotherapy, every 3 weeks, by mouth 36 tablet 0  . fish oil-omega-3 fatty acids 1000 MG capsule Take 1 g by mouth daily.     . hydrocortisone (ANUSOL-HC) 2.5 % rectal cream Apply 1 application topically 2 (two) times daily. 28.35 g 0  . lidocaine-prilocaine (EMLA) cream Apply to affected area once 30 g 3  . lidocaine-prilocaine (EMLA) cream Apply to affected area once 30 g 3  . morphine (MSIR) 15 MG tablet Take 1 tablet (15 mg total) by mouth every 6 (six) hours as needed for up to 7 days for severe pain. 30 tablet 0  . Multiple Vitamin (MULTIVITAMIN WITH MINERALS) TABS tablet Take 1 tablet by mouth daily.    . ondansetron (ZOFRAN) 8 MG tablet Take 1 tablet (8 mg total) by mouth every 8 (eight) hours as needed. Start on the third day after chemotherapy. 30 tablet 1  . pramoxine (PROCTOFOAM) 1 % foam Place 1 application rectally 3 (three) times daily as needed for anal itching. 15 g 2  . prochlorperazine (COMPAZINE) 10 MG tablet Take 1 tablet (10 mg total) by mouth every 6 (six) hours as needed (Nausea or vomiting). 30 tablet 1   No current facility-administered medications for this visit.     PHYSICAL EXAMINATION: ECOG PERFORMANCE STATUS: 2 - Symptomatic, <50% confined to bed  Vitals:   05/30/18 1448  BP: (!) 152/88  Pulse: 72  Resp: 17  Temp: 98.5 F (36.9 C)  SpO2: 100%   Filed Weights   05/30/18 1448  Weight: 169 lb (76.7 kg)    GENERAL:alert, no distress and comfortable Musculoskeletal:no cyanosis of digits and no clubbing  NEURO: alert & oriented x 3 with fluent speech, no focal motor/sensory deficits  LABORATORY DATA:  I have reviewed the data as listed    Component Value Date/Time   NA 139 05/18/2018 1225   K 4.0 05/18/2018 1225   CL 103 05/18/2018 1225   CO2 24 05/18/2018 1225   GLUCOSE 119 (H) 05/18/2018 1225   BUN 13  05/18/2018 1225   CREATININE 0.74 05/18/2018 1225   CREATININE 0.81 05/08/2018 1135   CALCIUM 9.5 05/18/2018 1225   PROT 7.0 05/18/2018 1225   ALBUMIN 3.1 (L) 05/18/2018 1225  AST 14 (L) 05/18/2018 1225   AST 12 (L) 05/08/2018 1135   ALT 12 05/18/2018 1225   ALT 12 05/08/2018 1135   ALKPHOS 87 05/18/2018 1225   BILITOT 0.3 05/18/2018 1225   BILITOT 0.4 05/08/2018 1135   GFRNONAA >60 05/18/2018 1225   GFRNONAA >60 05/08/2018 1135   GFRAA >60 05/18/2018 1225   GFRAA >60 05/08/2018 1135    No results found for: SPEP, UPEP  Lab Results  Component Value Date   WBC 7.7 05/18/2018   NEUTROABS 6.7 05/18/2018   HGB 10.6 (L) 05/18/2018   HCT 32.8 (L) 05/18/2018   MCV 92.7 05/18/2018   PLT 457 (H) 05/18/2018      Chemistry      Component Value Date/Time   NA 139 05/18/2018 1225   K 4.0 05/18/2018 1225   CL 103 05/18/2018 1225   CO2 24 05/18/2018 1225   BUN 13 05/18/2018 1225   CREATININE 0.74 05/18/2018 1225   CREATININE 0.81 05/08/2018 1135      Component Value Date/Time   CALCIUM 9.5 05/18/2018 1225   ALKPHOS 87 05/18/2018 1225   AST 14 (L) 05/18/2018 1225   AST 12 (L) 05/08/2018 1135   ALT 12 05/18/2018 1225   ALT 12 05/08/2018 1135   BILITOT 0.3 05/18/2018 1225   BILITOT 0.4 05/08/2018 1135       RADIOGRAPHIC STUDIES: I have reviewed the imaging studies again with the patient I have personally reviewed the radiological images as listed and agreed with the findings in the report. Nm Pet Image Restag (ps) Skull Base To Thigh  Result Date: 05/17/2018 CLINICAL DATA:  Staging treatment strategy for cervical cancer. Paraaortic lymph node positive. Status post radiation therapy including through 03/27/2018. EXAM: NUCLEAR MEDICINE PET SKULL BASE TO THIGH TECHNIQUE: 8.4 mCi F-18 FDG was injected intravenously. Full-ring PET imaging was performed from the skull base to thigh after the radiotracer. CT data was obtained and used for attenuation correction and anatomic  localization. Fasting blood glucose: 119 mg/dl COMPARISON:  12/08/2017 FINDINGS: Mediastinal blood pool activity: SUV max 2.4 NECK: Insert activity Incidental CT findings: No cervical adenopathy. Soft tissue fullness and heterogeneity within the thyroid likely relates to underlying multinodular goiter. CHEST: No pulmonary parenchymal or thoracic nodal hypermetabolism. Incidental CT findings: Right Port-A-Cath tip at high right atrium. Mild cardiomegaly. Lad coronary artery calcification. Bibasilar subsegmental atelectasis or scar. ABDOMEN/PELVIS: Focal hepatic flexure colonic wall hypermetabolism again identified. This measures on the order of a S.U.V. max of 15.1 on image 110/4. Compare a S.U.V. max of 16.6 on the prior. No abdominal nodal hypermetabolism. The previous hypermetabolism at the uterine cervix has improved. Example today at a S.U.V. max of 6.0 versus a S.U.V. max of 19.6 on the prior. Surrounding the uterus and throughout the pelvis, there is edema and low-level hypermetabolism. This is likely radiation induced. Given this limitation, no cervical nodal hypermetabolism is seen. Incidental CT findings: Air within the endocervix and endometrium may relate to necrosis of radiation therapy. Example image 148/4. Abdominal aortic atherosclerosis. New small volume abdominopelvic ascites. New omental thickening, without well-defined dominant omental mass. SKELETON: Improvement in L5 hypermetabolism. New foci of hypermetabolism within the L1 and L2 vertebral bodies. An index area of hypermetabolism within the T10 vertebral body measures a S.U.V. max of 6.8. There is also a new right humeral shaft hypermetabolic focus at a S.U.V. max of 6.4. Incidental CT findings: none IMPRESSION: 1. Response to therapy of cervical primary. Extensive pelvic edema and low-level hypermetabolism, likely radiation  induced. New endocervical and endometrial air may relate to necrosis from radiation therapy. Endometritis cannot be  excluded. 2. Given limitations of diffuse pelvic hypermetabolism, no hypermetabolic pelvic nodes identified. No evidence of distant soft tissue metastasis. 3. Progressive osseous metastasis. 4. Small volume abdominopelvic ascites with diffuse peritoneal/omental thickening, nonspecific. Recommend attention on follow-up to exclude early omental/peritoneal metastasis. 5. Coronary artery atherosclerosis. Aortic Atherosclerosis (ICD10-I70.0). Electronically Signed   By: Abigail Miyamoto M.D.   On: 05/17/2018 16:26    All questions were answered. The patient knows to call the clinic with any problems, questions or concerns. No barriers to learning was detected.  I spent 40 minutes counseling the patient face to face. The total time spent in the appointment was 50 minutes and more than 50% was on counseling and review of test results  Heath Lark, MD 05/30/2018 4:38 PM

## 2018-05-30 NOTE — Assessment & Plan Note (Signed)
We have extensive discussion about the causes of her abdominal pain It is multifactorial, likely due to intra-abdominal disease and constipation We discussed pain management The patient is somewhat reluctant to take pain medicine because of fear of addiction I reassured the patient that she will likely be successfully tapered once her chemotherapy starts working

## 2018-05-30 NOTE — Assessment & Plan Note (Signed)
We have extensive discussion about aggressive laxative therapy

## 2018-05-30 NOTE — Assessment & Plan Note (Signed)
The cause of her insomnia is multifactorial We discussed conservative management with Tylenol/Benadryl I suspect her pain probably contributed to her poor sleep I do not recommend benzodiazepine due to risk of dependency If Tylenol/Benadryl is not helpful, we can consider mirtazapine

## 2018-06-01 ENCOUNTER — Other Ambulatory Visit: Payer: Self-pay | Admitting: Hematology and Oncology

## 2018-06-01 ENCOUNTER — Telehealth: Payer: Self-pay | Admitting: Oncology

## 2018-06-01 DIAGNOSIS — G4701 Insomnia due to medical condition: Secondary | ICD-10-CM

## 2018-06-01 MED ORDER — MIRTAZAPINE 30 MG PO TABS: 30.0000 mg | ORAL_TABLET | Freq: Every day | ORAL | 11 refills | Status: DC

## 2018-06-01 NOTE — Telephone Encounter (Signed)
I will prescribe remeron

## 2018-06-01 NOTE — Telephone Encounter (Signed)
Called Toni Peterson back and advised her of message from Dr. Alvy Bimler.  She verbalized agreement.

## 2018-06-01 NOTE — Telephone Encounter (Signed)
Toni Peterson called and said she tried taking morphine, benadryl and tylenol for the last 2 nights without relief.  She said she is still not sleeping well and the morphine really does not do anything for the pain.  She said she is reluctant to double up on the Morphine.  She said Dr. Alvy Bimler mentioned 2 alternatives that were not addictive that she would like to try.  She would like them sent to the [facility] on [address] If possible because she can drive by and pick them up.

## 2018-06-07 ENCOUNTER — Encounter: Payer: Self-pay | Admitting: Hematology and Oncology

## 2018-06-08 ENCOUNTER — Other Ambulatory Visit: Payer: Self-pay | Admitting: Hematology and Oncology

## 2018-06-08 ENCOUNTER — Telehealth: Payer: Self-pay

## 2018-06-08 DIAGNOSIS — C7951 Secondary malignant neoplasm of bone: Secondary | ICD-10-CM

## 2018-06-08 DIAGNOSIS — G893 Neoplasm related pain (acute) (chronic): Secondary | ICD-10-CM

## 2018-06-08 MED ORDER — METHADONE HCL 10 MG PO TABS: 20.0000 mg | ORAL_TABLET | Freq: Two times a day (BID) | ORAL | 0 refills | Status: DC

## 2018-06-08 NOTE — Telephone Encounter (Signed)
Called and given below message. She verbalized understanding. She denied sending mychart message and said her family member must have sent the message without her knowledge. Read the message to her and she said that it is all true., she was going to wait to next visit and talk about the pain with Dr. Alvy Bimler. She will pick up Rx today and start pain medication journal.

## 2018-06-08 NOTE — Telephone Encounter (Signed)
-----   Message from Heath Lark, MD sent at 06/08/2018  7:55 AM EDT ----- Regarding: pain managment She sent a mychart message about pain management Please discourage that I will send methadone to her pharmacy to use in addition to IR morphine Watch out for sedation and constipation Recommend her to start a pain medication journal so we know how much IR morphine she takes while on methadone and her pain level and also document bowel movement

## 2018-06-14 ENCOUNTER — Other Ambulatory Visit: Payer: Self-pay

## 2018-06-14 ENCOUNTER — Inpatient Hospital Stay: Payer: BC Managed Care – PPO

## 2018-06-14 ENCOUNTER — Encounter: Payer: Self-pay | Admitting: Hematology and Oncology

## 2018-06-14 ENCOUNTER — Inpatient Hospital Stay: Payer: BC Managed Care – PPO | Attending: Hematology and Oncology | Admitting: Hematology and Oncology

## 2018-06-14 ENCOUNTER — Telehealth: Payer: Self-pay | Admitting: Hematology and Oncology

## 2018-06-14 DIAGNOSIS — T451X5D Adverse effect of antineoplastic and immunosuppressive drugs, subsequent encounter: Secondary | ICD-10-CM | POA: Diagnosis not present

## 2018-06-14 DIAGNOSIS — Z7189 Other specified counseling: Secondary | ICD-10-CM

## 2018-06-14 DIAGNOSIS — G893 Neoplasm related pain (acute) (chronic): Secondary | ICD-10-CM | POA: Diagnosis not present

## 2018-06-14 DIAGNOSIS — K5909 Other constipation: Secondary | ICD-10-CM

## 2018-06-14 DIAGNOSIS — D6481 Anemia due to antineoplastic chemotherapy: Secondary | ICD-10-CM

## 2018-06-14 DIAGNOSIS — Z9221 Personal history of antineoplastic chemotherapy: Secondary | ICD-10-CM | POA: Diagnosis not present

## 2018-06-14 DIAGNOSIS — C7951 Secondary malignant neoplasm of bone: Secondary | ICD-10-CM | POA: Diagnosis not present

## 2018-06-14 DIAGNOSIS — Z79899 Other long term (current) drug therapy: Secondary | ICD-10-CM | POA: Diagnosis not present

## 2018-06-14 DIAGNOSIS — R64 Cachexia: Secondary | ICD-10-CM | POA: Insufficient documentation

## 2018-06-14 DIAGNOSIS — C530 Malignant neoplasm of endocervix: Secondary | ICD-10-CM

## 2018-06-14 DIAGNOSIS — C539 Malignant neoplasm of cervix uteri, unspecified: Secondary | ICD-10-CM

## 2018-06-14 DIAGNOSIS — E860 Dehydration: Secondary | ICD-10-CM | POA: Diagnosis not present

## 2018-06-14 DIAGNOSIS — Z923 Personal history of irradiation: Secondary | ICD-10-CM | POA: Diagnosis not present

## 2018-06-14 DIAGNOSIS — Z5111 Encounter for antineoplastic chemotherapy: Secondary | ICD-10-CM | POA: Insufficient documentation

## 2018-06-14 DIAGNOSIS — T451X5A Adverse effect of antineoplastic and immunosuppressive drugs, initial encounter: Secondary | ICD-10-CM

## 2018-06-14 LAB — SAMPLE TO BLOOD BANK

## 2018-06-14 LAB — CBC WITH DIFFERENTIAL/PLATELET
Abs Immature Granulocytes: 0.19 10*3/uL — ABNORMAL HIGH (ref 0.00–0.07)
Basophils Absolute: 0 10*3/uL (ref 0.0–0.1)
Basophils Relative: 0 %
Eosinophils Absolute: 0 10*3/uL (ref 0.0–0.5)
Eosinophils Relative: 0 %
HCT: 27.6 % — ABNORMAL LOW (ref 36.0–46.0)
Hemoglobin: 8.7 g/dL — ABNORMAL LOW (ref 12.0–15.0)
Immature Granulocytes: 2 %
Lymphocytes Relative: 3 %
Lymphs Abs: 0.3 10*3/uL — ABNORMAL LOW (ref 0.7–4.0)
MCH: 28 pg (ref 26.0–34.0)
MCHC: 31.5 g/dL (ref 30.0–36.0)
MCV: 88.7 fL (ref 80.0–100.0)
Monocytes Absolute: 0.1 10*3/uL (ref 0.1–1.0)
Monocytes Relative: 1 %
Neutro Abs: 10.4 10*3/uL — ABNORMAL HIGH (ref 1.7–7.7)
Neutrophils Relative %: 94 %
Platelets: 389 10*3/uL (ref 150–400)
RBC: 3.11 MIL/uL — ABNORMAL LOW (ref 3.87–5.11)
RDW: 15.4 % (ref 11.5–15.5)
WBC: 11.1 10*3/uL — ABNORMAL HIGH (ref 4.0–10.5)
nRBC: 0.2 % (ref 0.0–0.2)

## 2018-06-14 LAB — COMPREHENSIVE METABOLIC PANEL
ALT: 15 U/L (ref 0–44)
AST: 12 U/L — ABNORMAL LOW (ref 15–41)
Albumin: 2.7 g/dL — ABNORMAL LOW (ref 3.5–5.0)
Alkaline Phosphatase: 94 U/L (ref 38–126)
Anion gap: 12 (ref 5–15)
BUN: 20 mg/dL (ref 8–23)
CO2: 23 mmol/L (ref 22–32)
Calcium: 9.4 mg/dL (ref 8.9–10.3)
Chloride: 101 mmol/L (ref 98–111)
Creatinine, Ser: 0.91 mg/dL (ref 0.44–1.00)
GFR calc Af Amer: >60 mL/min (ref >60)
GFR calc non Af Amer: >60 mL/min (ref >60)
Glucose, Bld: 203 mg/dL — ABNORMAL HIGH (ref 70–99)
Potassium: 4.6 mmol/L (ref 3.5–5.1)
Sodium: 136 mmol/L (ref 135–145)
Total Bilirubin: <0.2 mg/dL — ABNORMAL LOW (ref 0.3–1.2)
Total Protein: 6.8 g/dL (ref 6.5–8.1)

## 2018-06-14 LAB — MAGNESIUM: Magnesium: 1.7 mg/dL (ref 1.7–2.4)

## 2018-06-14 MED ORDER — PALONOSETRON HCL INJECTION 0.25 MG/5ML
0.2500 mg | Freq: Once | INTRAVENOUS | Status: AC
  Administered 2018-06-14: 11:00:00 0.25 mg via INTRAVENOUS

## 2018-06-14 MED ORDER — SODIUM CHLORIDE 0.9% FLUSH
10.0000 mL | Freq: Once | INTRAVENOUS | Status: AC
  Administered 2018-06-14: 08:00:00 10 mL
  Filled 2018-06-14: qty 10

## 2018-06-14 MED ORDER — FAMOTIDINE IN NACL 20-0.9 MG/50ML-% IV SOLN
INTRAVENOUS | Status: AC
  Filled 2018-06-14: qty 50

## 2018-06-14 MED ORDER — SODIUM CHLORIDE 0.9 % IV SOLN
101.2500 mg/m2 | Freq: Once | INTRAVENOUS | Status: AC
  Administered 2018-06-14: 12:00:00 186 mg via INTRAVENOUS
  Filled 2018-06-14: qty 31

## 2018-06-14 MED ORDER — SODIUM CHLORIDE 0.9 % IV SOLN
Freq: Once | INTRAVENOUS | Status: AC
  Administered 2018-06-14: 10:00:00 via INTRAVENOUS
  Filled 2018-06-14: qty 250

## 2018-06-14 MED ORDER — SODIUM CHLORIDE 0.9 % IV SOLN
Freq: Once | INTRAVENOUS | Status: AC
  Administered 2018-06-14: 11:00:00 via INTRAVENOUS
  Filled 2018-06-14: qty 5

## 2018-06-14 MED ORDER — PALONOSETRON HCL INJECTION 0.25 MG/5ML
INTRAVENOUS | Status: AC
  Filled 2018-06-14: qty 5

## 2018-06-14 MED ORDER — SODIUM CHLORIDE 0.9 % IV SOLN
372.4000 mg | Freq: Once | INTRAVENOUS | Status: AC
  Administered 2018-06-14: 370 mg via INTRAVENOUS
  Filled 2018-06-14: qty 37

## 2018-06-14 MED ORDER — SODIUM CHLORIDE 0.9% FLUSH
10.0000 mL | INTRAVENOUS | Status: DC | PRN
  Administered 2018-06-14: 16:00:00 10 mL
  Filled 2018-06-14: qty 10

## 2018-06-14 MED ORDER — DIPHENHYDRAMINE HCL 50 MG/ML IJ SOLN
50.0000 mg | Freq: Once | INTRAMUSCULAR | Status: AC
  Administered 2018-06-14: 11:00:00 50 mg via INTRAVENOUS

## 2018-06-14 MED ORDER — DIPHENHYDRAMINE HCL 50 MG/ML IJ SOLN
INTRAMUSCULAR | Status: AC
  Filled 2018-06-14: qty 1

## 2018-06-14 MED ORDER — FAMOTIDINE IN NACL 20-0.9 MG/50ML-% IV SOLN
20.0000 mg | Freq: Once | INTRAVENOUS | Status: AC
  Administered 2018-06-14: 20 mg via INTRAVENOUS

## 2018-06-14 MED ORDER — HEPARIN SOD (PORK) LOCK FLUSH 100 UNIT/ML IV SOLN
500.0000 [IU] | Freq: Once | INTRAVENOUS | Status: AC | PRN
  Administered 2018-06-14: 16:00:00 500 [IU]
  Filled 2018-06-14: qty 5

## 2018-06-14 NOTE — Assessment & Plan Note (Signed)
She continues to have intermittent constipation She continues to take laxatives as directed

## 2018-06-14 NOTE — Assessment & Plan Note (Signed)
She has progressive pancytopenia due to recurrent treatment and bone marrow suppression from recent chemotherapy I plan minor dose adjustment today and she will return next week to get her blood count recheck and transfusion as needed I recommend transfusion to keep hemoglobin greater than 8.  She will receive 1 unit of blood if needed

## 2018-06-14 NOTE — Assessment & Plan Note (Signed)
Her cancer associated pain is better controlled with recent IR morphine and methadone She will continue taking her pain medicine as directed

## 2018-06-14 NOTE — Progress Notes (Signed)
[facility] OFFICE PROGRESS NOTE  Patient Care Team: Antony Contras, MD as PCP - General (Family Medicine) Minus Breeding, MD as PCP - Cardiology (Cardiology) Awanda Mink Craige Cotta, RN as Oncology Nurse Navigator (Oncology)  ASSESSMENT & PLAN:  Cervical cancer [facility] She tolerated treatment in fair manner except for progressive pancytopenia, recent constipation and pain I plan minor dose adjustment of carboplatin due to progressive pancytopenia I recommend her to return next week to get her blood count rechecked and transfusion as needed I will see her again in 3 weeks for cycle 3 of therapy  Anemia due to antineoplastic chemotherapy She has progressive pancytopenia due to recurrent treatment and bone marrow suppression from recent chemotherapy I plan minor dose adjustment today and she will return next week to get her blood count recheck and transfusion as needed I recommend transfusion to keep hemoglobin greater than 8.  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   INTERVAL HISTORY: Please see below for problem oriented charting. She is seen prior to cycle 2 of treatment Her pain is better controlled since last time I saw her She had less vaginal discharge She denies worsening neuropathy She complained of mild fatigue No recent nausea She is continues to have constipation with bowel movement every other day She is taking laxatives as directed Denies recent infection, fever or chills  SUMMARY OF ONCOLOGIC HISTORY: Oncology History   MSI - Stable on cervix biopsy PD-L1 2%  Bone biopsy is consistent with metastatic GYN  primary MMR protein MLH1 and PMS2 abnormalities noted on colon biopsy Genetics are neg     Cervical cancer ([place])   09/08/2017 Initial Diagnosis    Patient has a history of symptomatic endometrial fibroids in 2013 for which she underwent hysteroscopic resection with benign pathology.  She has had lifelong normal Pap smears including in 2019.  Of note HPV testing was not performed in this Pap in 2019.  She developed symptoms of very light vaginal spotting in August 2019     11/22/2017 Procedure    She was seen by her gynecologist, Dr. Radene Knee, who performed a pelvic examination.  On physical examination and endocervical mass was appreciated and felt to be likely to be a polyp or fibroid was biopsied on 11/22/17.       11/22/2017 Pathology Results    This revealed poorly differentiated invasive carcinoma.  Immunostains revealed that it was CK7 positive, P 16+, p53 positive, PAX 8+ and CK 5 6 weakly positive.  CK 20, ER PR and p53 immunostains were negative.  The morphology and Immuno profile favored a gynecologic primary with endocervical and endometrial adenocarcinoma in the differential diagnosis.     11/22/2017 Imaging    An ultrasound scan on November 22, 2017 revealed several intramural fibroids.  With saline infusion there was a 1.8 cm density within the endometrial cavity which could be a fibroid or polyp.     12/08/2017 Imaging    PET:  1. Marked hypermetabolism involving the cervix and uterus. Difficult to be certain of the origin but favor cervix. No evidence of parametrial or parauterine tumor and no pelvic or retroperitoneal hypermetabolic lymphadenopathy. 2. L5 vertebral body  lesion worrisome for metastasis. 3. Hypermetabolic area involving the hepatic flexure region of the colon is worrisome for colon cancer. Recommend colonoscopy. 4. Benign left adrenal gland adenoma. 5. Symmetric hypermetabolism in the tongue base and tonsillar regions is likely inflamed lymphoid tissue. No neck  mass or adenopathy.    12/19/2017 Imaging    1. Diffuse marrow replacement in the L5 vertebral body, indeterminate though metastatic disease is a concern. Correlate with upcoming biopsy. No extraosseous tumor or pathologic fracture. 2. Marrow signal abnormality on both sides of the T11-12 disc space favored to be degenerative. 3. Severe L4-5 facet arthrosis with grade 1 anterolisthesis, mild bilateral lateral recess stenosis, and mild-to-moderate bilateral neural foraminal stenosis. Findings may worsen with standing. 4. Moderate bilateral neural foraminal stenosis at L5-S1. 5. Partially visualized uterine masses which may reflect a combination of known adenocarcinoma and fibroids. 4 cm right adnexal mass may reflect an exophytic subserosal fibroid or ovarian neoplasm.    12/20/2017 Echocardiogram    LV EF: 60% -   65%    12/23/2017 Procedure    Successful placement of a right IJ approach Power Port with ultrasound and fluoroscopic guidance. The catheter is ready for use.    12/23/2017 Pathology Results    Bone, biopsy, L5 - METASTATIC POORLY DIFFERENTIATED CARCINOMA. - SEE COMMENT. Microscopic Comment Dr. Vic Ripper has reviewed the case and concurs with this interpretation. The case was discussed wtih Joylene John on 12/22/2017. Per request, a block will be sent for PDL1 testing and the results reported separately    01/11/2018 Cancer Staging    Staging form: Cervix Uteri, AJCC 8th Edition - Clinical: FIGO Stage IVB (cT2, cN0, pM1) - Signed by Heath Lark, MD on 01/11/2018    01/16/2018 - 03/27/2018 Radiation Therapy    Radiation treatment dates:   1. 01/16/18-02/21/18 (external beam)                                                   2. 02/28/18, 03/07/18, 03/16/18, 03/20/18, 03/27/18 (brachytherapy treatments)  Site/dose:   1. Pelvis; 25 fractions of 1.8 Gy for a total of 45 Gy (including solitary bone metastasis at L5)                      2. Cervix; 5 fractions of 5.5 Gy for a total of 27.5  Gy  Beams/energy:   1. Photon 3D; 15X                              2. HDR Ir-192 brachytherapy, tandem/ring system for treatment    01/17/2018 - 02/22/2018 Chemotherapy    The patient had weekly cisplatin    05/17/2018 PET scan    1. Response to therapy of cervical primary. Extensive pelvic edema and low-level hypermetabolism, likely radiation induced. New endocervical and endometrial air may relate to necrosis from radiation therapy. Endometritis cannot be excluded. 2. Given limitations of diffuse pelvic hypermetabolism, no hypermetabolic pelvic nodes identified. No evidence of distant soft tissue metastasis. 3. Progressive osseous metastasis. 4. Small volume abdominopelvic ascites with diffuse peritoneal/omental thickening, nonspecific. Recommend attention on follow-up to exclude early omental/peritoneal metastasis. 5. Coronary artery atherosclerosis. Aortic Atherosclerosis (ICD10-I70.0).    05/24/2018 -  Chemotherapy    The patient had palonosetron (ALOXI) injection 0.25 mg, 0.25 mg, Intravenous,  Once, 2 of 6 cycles Administration: 0.25 mg (05/24/2018) CARBOplatin (PARAPLATIN) 470 mg in sodium chloride 0.9 % 250 mL chemo infusion, 470 mg, Intravenous,  Once, 2 of 6 cycles Administration: 470 mg (05/24/2018) PACLitaxel (TAXOL) 186 mg in sodium chloride 0.9 % 250 mL chemo infusion (> '80mg'$ /m2), 101.25 mg/m2 = 186 mg (75 % of original dose 135 mg/m2), Intravenous,  Once, 2 of 6 cycles Dose modification: 101.25 mg/m2 (75 % of original dose 135 mg/m2, Cycle 1, Reason: Provider Judgment) Administration: 186 mg (05/24/2018) fosaprepitant (EMEND) 150 mg, dexamethasone (DECADRON) 12 mg in sodium chloride 0.9 % 145 mL IVPB, , Intravenous,  Once, 2 of 6 cycles Administration:  (05/24/2018)  for chemotherapy treatment.      Metastasis to bone ([place])   12/28/2017 Initial Diagnosis    Metastasis to bone ([place])    05/24/2018 -  Chemotherapy    The patient had palonosetron (ALOXI) injection 0.25 mg, 0.25  mg, Intravenous,  Once, 2 of 6 cycles Administration: 0.25 mg (05/24/2018) CARBOplatin (PARAPLATIN) 470 mg in sodium chloride 0.9 % 250 mL chemo infusion, 470 mg, Intravenous,  Once, 2 of 6 cycles Administration: 470 mg (05/24/2018) PACLitaxel (TAXOL) 186 mg in sodium chloride 0.9 % 250 mL chemo infusion (> '80mg'$ /m2), 101.25 mg/m2 = 186 mg (75 % of original dose 135 mg/m2), Intravenous,  Once, 2 of 6 cycles Dose modification: 101.25 mg/m2 (75 % of original dose 135 mg/m2, Cycle 1, Reason: Provider Judgment) Administration: 186 mg (05/24/2018) fosaprepitant (EMEND) 150 mg, dexamethasone (DECADRON) 12 mg in sodium chloride 0.9 % 145 mL IVPB, , Intravenous,  Once, 2 of 6 cycles Administration:  (05/24/2018)  for chemotherapy treatment.      Colon cancer ([place])   01/11/2018 Procedure    - Hemorrhoids found on perianal exam. - Malignant partially obstructing tumor at the hepatic flexure. Biopsied. Tattooed. - Two 2 to 5 mm polyps in the sigmoid colon, removed with a cold snare. Resected and retrieved. - Diverticulosis in the sigmoid colon, in the distal descending colon and in the ascending colon. - Non-bleeding internal hemorrhoids. - The examined portion of the ileum was normal.    01/11/2018 Pathology Results    1. Colon, biopsy, hepatic flexure - ADENOCARCINOMA, SEE COMMENT. 2. Colon, polyp(s), sigmoid, polyp (2) - HYPERPLASTIC POLYP (X2 FRAGMENTS). - NO DYSPLASIA OR MALIGNANCY. Microscopic Comment 1. There are additional fragments of tubular adenoma suggesting this is a primary colon adenocarcinoma.     02/07/2018 Cancer Staging    Staging form: Colon and Rectum, AJCC 8th Edition - Clinical: Stage I (cT1, cN0, cM0) - Signed by Heath Lark, MD on 02/07/2018     Genetic Testing    Negative genetic testing on the CancerNext panel.  The CancerNext gene panel offered by [employer] includes sequencing and rearrangement analysis for the following 34 genes:   APC, ATM, BARD1, BMPR1A, BRCA1,  BRCA2, BRIP1, CDH1, CDK4, CDKN2A, CHEK2, DICER1, HOXB13, EPCAM, GREM1, MLH1, MRE11A, MSH2, MSH6, MUTYH, NBN, NF1, PALB2, PMS2, POLD1, POLE, PTEN, RAD50, RAD51C, RAD51D, SMAD4, SMARCA4, STK11, and TP53.  The report date is April 05, 2018.     REVIEW OF SYSTEMS:   Constitutional: Denies fevers, chills or abnormal weight loss Eyes: Denies blurriness of vision Ears, nose, mouth, throat, and face: Denies mucositis or sore throat Respiratory: Denies cough, dyspnea or wheezes Cardiovascular: Denies palpitation, chest discomfort or lower extremity swelling Skin: Denies abnormal skin rashes Lymphatics: Denies new lymphadenopathy or easy bruising Neurological:Denies numbness, tingling or new weaknesses Behavioral/Psych: Mood is stable, no  new changes  All other systems were reviewed with the patient and are negative.  I have reviewed the past medical history, past surgical history, social history and family history with the patient and they are unchanged from previous note.  ALLERGIES:  has No Known Allergies.  MEDICATIONS:  Current Outpatient Medications  Medication Sig Dispense Refill  . amLODipine (NORVASC) 10 MG tablet Take 10 mg by mouth every evening.     Marland Kitchen atenolol (TENORMIN) 50 MG tablet Take 50 mg by mouth every evening.   1  . Cholecalciferol (VITAMIN D3) 5000 units CAPS Take 5,000 Units by mouth daily.     Marland Kitchen dexamethasone (DECADRON) 4 MG tablet Take 3 tabs at the night before and 3 tab the morning of chemotherapy, every 3 weeks, by mouth 36 tablet 0  . fish oil-omega-3 fatty acids 1000 MG capsule Take 1 g by mouth daily.     . hydrocortisone (ANUSOL-HC) 2.5 % rectal cream Apply 1 application topically 2 (two) times daily. 28.35 g 0  . lidocaine-prilocaine (EMLA) cream Apply to affected area once 30 g 3  . lidocaine-prilocaine (EMLA) cream Apply to affected area once 30 g 3  . methadone (DOLOPHINE) 10 MG tablet Take 2 tablets (20 mg total) by mouth every 12 (twelve) hours. 30 tablet  0  . mirtazapine (REMERON) 30 MG tablet Take 1 tablet (30 mg total) by mouth at bedtime. 30 tablet 11  . Multiple Vitamin (MULTIVITAMIN WITH MINERALS) TABS tablet Take 1 tablet by mouth daily.    . ondansetron (ZOFRAN) 8 MG tablet Take 1 tablet (8 mg total) by mouth every 8 (eight) hours as needed. Start on the third day after chemotherapy. 30 tablet 1  . pramoxine (PROCTOFOAM) 1 % foam Place 1 application rectally 3 (three) times daily as needed for anal itching. 15 g 2  . prochlorperazine (COMPAZINE) 10 MG tablet Take 1 tablet (10 mg total) by mouth every 6 (six) hours as needed (Nausea or vomiting). 30 tablet 1   No current facility-administered medications for this visit.    Facility-Administered Medications Ordered in Other Visits  Medication Dose Route Frequency Provider Last Rate Last Dose  . 0.9 %  sodium chloride infusion   Intravenous Once Alvy Bimler, Carlee Tesfaye, MD      . CARBOplatin (PARAPLATIN) 370 mg in sodium chloride 0.9 % 100 mL chemo infusion  370 mg Intravenous Once Alvy Bimler, Eliseo Withers, MD      . diphenhydrAMINE (BENADRYL) injection 50 mg  50 mg Intravenous Once Alvy Bimler, Rondalyn Belford, MD      . famotidine (PEPCID) IVPB 20 mg premix  20 mg Intravenous Once Alvy Bimler, Konnor Jorden, MD      . fosaprepitant (EMEND) 150 mg, dexamethasone (DECADRON) 12 mg in sodium chloride 0.9 % 145 mL IVPB   Intravenous Once Alvy Bimler, Rocio Wolak, MD      . heparin lock flush 100 unit/mL  500 Units Intracatheter Once PRN Alvy Bimler, Lalita Ebel, MD      . PACLitaxel (TAXOL) 186 mg in sodium chloride 0.9 % 250 mL chemo infusion (> '80mg'$ /m2)  101.25 mg/m2 (Treatment Plan Recorded) Intravenous Once Alvy Bimler, Latima Hamza, MD      . palonosetron (ALOXI) injection 0.25 mg  0.25 mg Intravenous Once Cliffard Hair, MD      . sodium chloride flush (NS) 0.9 % injection 10 mL  10 mL Intracatheter PRN Alvy Bimler, Averil Digman, MD        PHYSICAL EXAMINATION: ECOG PERFORMANCE STATUS: 1 - Symptomatic but completely ambulatory  Vitals:   06/14/18 0853  BP: Marland Kitchen)  141/77  Pulse: 85  Resp: 18   Temp: 98.1 F (36.7 C)  SpO2: 96%   Filed Weights   06/14/18 0853  Weight: 170 lb 14.4 oz (77.5 kg)    GENERAL:alert, no distress and comfortable.  She looks pale SKIN: skin color, texture, turgor are normal, no rashes or significant lesions EYES: normal, Conjunctiva are pink and non-injected, sclera clear OROPHARYNX:no exudate, no erythema and lips, buccal mucosa, and tongue normal  NECK: supple, thyroid normal size, non-tender, without nodularity LYMPH:  no palpable lymphadenopathy in the cervical, axillary or inguinal LUNGS: clear to auscultation and percussion with normal breathing effort HEART: regular rate & rhythm and no murmurs and no lower extremity edema ABDOMEN:abdomen soft, non-tender and normal bowel sounds Musculoskeletal:no cyanosis of digits and no clubbing  NEURO: alert & oriented x 3 with fluent speech, no focal motor/sensory deficits  LABORATORY DATA:  I have reviewed the data as listed    Component Value Date/Time   NA 136 06/14/2018 0756   K 4.6 06/14/2018 0756   CL 101 06/14/2018 0756   CO2 23 06/14/2018 0756   GLUCOSE 203 (H) 06/14/2018 0756   BUN 20 06/14/2018 0756   CREATININE 0.91 06/14/2018 0756   CREATININE 0.81 05/08/2018 1135   CALCIUM 9.4 06/14/2018 0756   PROT 6.8 06/14/2018 0756   ALBUMIN 2.7 (L) 06/14/2018 0756   AST 12 (L) 06/14/2018 0756   AST 12 (L) 05/08/2018 1135   ALT 15 06/14/2018 0756   ALT 12 05/08/2018 1135   ALKPHOS 94 06/14/2018 0756   BILITOT <0.2 (L) 06/14/2018 0756   BILITOT 0.4 05/08/2018 1135   GFRNONAA >60 06/14/2018 0756   GFRNONAA >60 05/08/2018 1135   GFRAA >60 06/14/2018 0756   GFRAA >60 05/08/2018 1135    No results found for: SPEP, UPEP  Lab Results  Component Value Date   WBC 11.1 (H) 06/14/2018   NEUTROABS 10.4 (H) 06/14/2018   HGB 8.7 (L) 06/14/2018   HCT 27.6 (L) 06/14/2018   MCV 88.7 06/14/2018   PLT 389 06/14/2018      Chemistry      Component Value Date/Time   NA 136 06/14/2018 0756   K  4.6 06/14/2018 0756   CL 101 06/14/2018 0756   CO2 23 06/14/2018 0756   BUN 20 06/14/2018 0756   CREATININE 0.91 06/14/2018 0756   CREATININE 0.81 05/08/2018 1135      Component Value Date/Time   CALCIUM 9.4 06/14/2018 0756   ALKPHOS 94 06/14/2018 0756   AST 12 (L) 06/14/2018 0756   AST 12 (L) 05/08/2018 1135   ALT 15 06/14/2018 0756   ALT 12 05/08/2018 1135   BILITOT <0.2 (L) 06/14/2018 0756   BILITOT 0.4 05/08/2018 1135       RADIOGRAPHIC STUDIES: I have personally reviewed the radiological images as listed and agreed with the findings in the report. Nm Pet Image Restag (ps) Skull Base To Thigh  Result Date: 05/17/2018 CLINICAL DATA:  Staging treatment strategy for cervical cancer. Paraaortic lymph node positive. Status post radiation therapy including through 03/27/2018. EXAM: NUCLEAR MEDICINE PET SKULL BASE TO THIGH TECHNIQUE: 8.4 mCi F-18 FDG was injected intravenously. Full-ring PET imaging was performed from the skull base to thigh after the radiotracer. CT data was obtained and used for attenuation correction and anatomic localization. Fasting blood glucose: 119 mg/dl COMPARISON:  12/08/2017 FINDINGS: Mediastinal blood pool activity: SUV max 2.4 NECK: Insert activity Incidental CT findings: No cervical adenopathy. Soft tissue fullness and heterogeneity within the  thyroid likely relates to underlying multinodular goiter. CHEST: No pulmonary parenchymal or thoracic nodal hypermetabolism. Incidental CT findings: Right Port-A-Cath tip at high right atrium. Mild cardiomegaly. Lad coronary artery calcification. Bibasilar subsegmental atelectasis or scar. ABDOMEN/PELVIS: Focal hepatic flexure colonic wall hypermetabolism again identified. This measures on the order of a S.U.V. max of 15.1 on image 110/4. Compare a S.U.V. max of 16.6 on the prior. No abdominal nodal hypermetabolism. The previous hypermetabolism at the uterine cervix has improved. Example today at a S.U.V. max of 6.0 versus a  S.U.V. max of 19.6 on the prior. Surrounding the uterus and throughout the pelvis, there is edema and low-level hypermetabolism. This is likely radiation induced. Given this limitation, no cervical nodal hypermetabolism is seen. Incidental CT findings: Air within the endocervix and endometrium may relate to necrosis of radiation therapy. Example image 148/4. Abdominal aortic atherosclerosis. New small volume abdominopelvic ascites. New omental thickening, without well-defined dominant omental mass. SKELETON: Improvement in L5 hypermetabolism. New foci of hypermetabolism within the L1 and L2 vertebral bodies. An index area of hypermetabolism within the T10 vertebral body measures a S.U.V. max of 6.8. There is also a new right humeral shaft hypermetabolic focus at a S.U.V. max of 6.4. Incidental CT findings: none IMPRESSION: 1. Response to therapy of cervical primary. Extensive pelvic edema and low-level hypermetabolism, likely radiation induced. New endocervical and endometrial air may relate to necrosis from radiation therapy. Endometritis cannot be excluded. 2. Given limitations of diffuse pelvic hypermetabolism, no hypermetabolic pelvic nodes identified. No evidence of distant soft tissue metastasis. 3. Progressive osseous metastasis. 4. Small volume abdominopelvic ascites with diffuse peritoneal/omental thickening, nonspecific. Recommend attention on follow-up to exclude early omental/peritoneal metastasis. 5. Coronary artery atherosclerosis. Aortic Atherosclerosis (ICD10-I70.0). Electronically Signed   By: Abigail Miyamoto M.D.   On: 05/17/2018 16:26    All questions were answered. The patient knows to call the clinic with any problems, questions or concerns. No barriers to learning was detected.  I spent 30 minutes counseling the patient face to face. The total time spent in the appointment was 40 minutes and more than 50% was on counseling and review of test results  Heath Lark, MD 06/14/2018 10:18 AM

## 2018-06-14 NOTE — Assessment & Plan Note (Signed)
She tolerated treatment in fair manner except for progressive pancytopenia, recent constipation and pain I plan minor dose adjustment of carboplatin due to progressive pancytopenia I recommend her to return next week to get her blood count rechecked and transfusion as needed I will see her again in 3 weeks for cycle 3 of therapy

## 2018-06-14 NOTE — Telephone Encounter (Signed)
Scheduled appt per 5/06 sch message - pt to get an updated schedule in treatment area, RN aware to print out

## 2018-06-14 NOTE — Patient Instructions (Signed)
[facility] Discharge Instructions for Patients Receiving Chemotherapy  Today you received the following chemotherapy agents: Taxol and Carboplatin.   To help prevent nausea and vomiting after your treatment, we encourage you to take your nausea medication as directed DO NOT TAKE ZOFRAN FOR THREE DAYS  If you develop nausea and vomiting that is not controlled by your nausea medication, call the clinic.   BELOW ARE SYMPTOMS THAT SHOULD BE REPORTED IMMEDIATELY:  *FEVER GREATER THAN 100.5 F  *CHILLS WITH OR WITHOUT FEVER  NAUSEA AND VOMITING THAT IS NOT CONTROLLED WITH YOUR NAUSEA MEDICATION  *UNUSUAL SHORTNESS OF BREATH  *UNUSUAL BRUISING OR BLEEDING  TENDERNESS IN MOUTH AND THROAT WITH OR WITHOUT PRESENCE OF ULCERS  *URINARY PROBLEMS  *BOWEL PROBLEMS  UNUSUAL RASH Items with * indicate a potential emergency and should be followed up as soon as possible.  Feel free to call the clinic you have any questions or concerns. The clinic phone number is [phone].

## 2018-06-16 ENCOUNTER — Ambulatory Visit: Payer: BC Managed Care – PPO | Admitting: Gynecologic Oncology

## 2018-06-23 ENCOUNTER — Inpatient Hospital Stay: Payer: BC Managed Care – PPO

## 2018-06-23 ENCOUNTER — Other Ambulatory Visit: Payer: Self-pay

## 2018-06-23 DIAGNOSIS — C530 Malignant neoplasm of endocervix: Secondary | ICD-10-CM

## 2018-06-23 DIAGNOSIS — D6481 Anemia due to antineoplastic chemotherapy: Secondary | ICD-10-CM

## 2018-06-23 DIAGNOSIS — C539 Malignant neoplasm of cervix uteri, unspecified: Secondary | ICD-10-CM

## 2018-06-23 DIAGNOSIS — C7951 Secondary malignant neoplasm of bone: Secondary | ICD-10-CM

## 2018-06-23 DIAGNOSIS — Z95828 Presence of other vascular implants and grafts: Secondary | ICD-10-CM

## 2018-06-23 LAB — CBC WITH DIFFERENTIAL/PLATELET
Abs Immature Granulocytes: 0.03 10*3/uL (ref 0.00–0.07)
Basophils Absolute: 0 10*3/uL (ref 0.0–0.1)
Basophils Relative: 0 %
Eosinophils Absolute: 0 10*3/uL (ref 0.0–0.5)
Eosinophils Relative: 0 %
HCT: 25.6 % — ABNORMAL LOW (ref 36.0–46.0)
Hemoglobin: 8.3 g/dL — ABNORMAL LOW (ref 12.0–15.0)
Immature Granulocytes: 1 %
Lymphocytes Relative: 11 %
Lymphs Abs: 0.5 10*3/uL — ABNORMAL LOW (ref 0.7–4.0)
MCH: 28.3 pg (ref 26.0–34.0)
MCHC: 32.4 g/dL (ref 30.0–36.0)
MCV: 87.4 fL (ref 80.0–100.0)
Monocytes Absolute: 0.5 10*3/uL (ref 0.1–1.0)
Monocytes Relative: 13 %
Neutro Abs: 3.3 10*3/uL (ref 1.7–7.7)
Neutrophils Relative %: 75 %
Platelets: 398 10*3/uL (ref 150–400)
RBC: 2.93 MIL/uL — ABNORMAL LOW (ref 3.87–5.11)
RDW: 15.7 % — ABNORMAL HIGH (ref 11.5–15.5)
WBC: 4.3 10*3/uL (ref 4.0–10.5)
nRBC: 0 % (ref 0.0–0.2)

## 2018-06-23 LAB — COMPREHENSIVE METABOLIC PANEL
ALT: 17 U/L (ref 0–44)
AST: 11 U/L — ABNORMAL LOW (ref 15–41)
Albumin: 2.8 g/dL — ABNORMAL LOW (ref 3.5–5.0)
Alkaline Phosphatase: 95 U/L (ref 38–126)
Anion gap: 8 (ref 5–15)
BUN: 11 mg/dL (ref 8–23)
CO2: 26 mmol/L (ref 22–32)
Calcium: 9.2 mg/dL (ref 8.9–10.3)
Chloride: 101 mmol/L (ref 98–111)
Creatinine, Ser: 0.73 mg/dL (ref 0.44–1.00)
GFR calc Af Amer: >60 mL/min (ref >60)
GFR calc non Af Amer: >60 mL/min (ref >60)
Glucose, Bld: 121 mg/dL — ABNORMAL HIGH (ref 70–99)
Potassium: 4.1 mmol/L (ref 3.5–5.1)
Sodium: 135 mmol/L (ref 135–145)
Total Bilirubin: 0.2 mg/dL — ABNORMAL LOW (ref 0.3–1.2)
Total Protein: 6.8 g/dL (ref 6.5–8.1)

## 2018-06-23 LAB — MAGNESIUM: Magnesium: 1.6 mg/dL — ABNORMAL LOW (ref 1.7–2.4)

## 2018-06-23 LAB — SAMPLE TO BLOOD BANK

## 2018-06-23 MED ORDER — SODIUM CHLORIDE 0.9% FLUSH
10.0000 mL | INTRAVENOUS | Status: AC | PRN
  Administered 2018-06-23: 11:00:00 10 mL via INTRAVENOUS
  Filled 2018-06-23: qty 10

## 2018-06-23 MED ORDER — SODIUM CHLORIDE 0.9% FLUSH
10.0000 mL | Freq: Once | INTRAVENOUS | Status: AC
  Administered 2018-06-23: 10 mL
  Filled 2018-06-23: qty 10

## 2018-06-23 MED ORDER — HEPARIN SOD (PORK) LOCK FLUSH 100 UNIT/ML IV SOLN
500.0000 [IU] | Freq: Once | INTRAVENOUS | Status: AC
  Administered 2018-06-23: 500 [IU] via INTRAVENOUS
  Filled 2018-06-23: qty 5

## 2018-06-23 NOTE — Patient Instructions (Signed)

## 2018-06-23 NOTE — Addendum Note (Signed)
Addended by: Smiley Houseman F on: 06/23/2018 11:18 AM   Modules accepted: Orders, SmartSet

## 2018-06-24 ENCOUNTER — Other Ambulatory Visit: Payer: Self-pay | Admitting: Hematology and Oncology

## 2018-07-05 ENCOUNTER — Inpatient Hospital Stay: Payer: BC Managed Care – PPO

## 2018-07-05 ENCOUNTER — Encounter: Payer: Self-pay | Admitting: Oncology

## 2018-07-05 ENCOUNTER — Other Ambulatory Visit: Payer: Self-pay

## 2018-07-05 ENCOUNTER — Encounter: Payer: Self-pay | Admitting: Hematology and Oncology

## 2018-07-05 ENCOUNTER — Inpatient Hospital Stay (HOSPITAL_BASED_OUTPATIENT_CLINIC_OR_DEPARTMENT_OTHER): Payer: BC Managed Care – PPO | Admitting: Hematology and Oncology

## 2018-07-05 VITALS — BP 161/86 | HR 71 | Temp 98.0°F | Resp 18 | Ht 61.0 in | Wt 151.8 lb

## 2018-07-05 DIAGNOSIS — Z95828 Presence of other vascular implants and grafts: Secondary | ICD-10-CM

## 2018-07-05 DIAGNOSIS — D6481 Anemia due to antineoplastic chemotherapy: Secondary | ICD-10-CM

## 2018-07-05 DIAGNOSIS — C7951 Secondary malignant neoplasm of bone: Secondary | ICD-10-CM

## 2018-07-05 DIAGNOSIS — G893 Neoplasm related pain (acute) (chronic): Secondary | ICD-10-CM | POA: Diagnosis not present

## 2018-07-05 DIAGNOSIS — E860 Dehydration: Secondary | ICD-10-CM

## 2018-07-05 DIAGNOSIS — K5909 Other constipation: Secondary | ICD-10-CM

## 2018-07-05 DIAGNOSIS — C530 Malignant neoplasm of endocervix: Secondary | ICD-10-CM

## 2018-07-05 DIAGNOSIS — C539 Malignant neoplasm of cervix uteri, unspecified: Secondary | ICD-10-CM

## 2018-07-05 DIAGNOSIS — Z923 Personal history of irradiation: Secondary | ICD-10-CM

## 2018-07-05 DIAGNOSIS — C183 Malignant neoplasm of hepatic flexure: Secondary | ICD-10-CM

## 2018-07-05 DIAGNOSIS — R64 Cachexia: Secondary | ICD-10-CM

## 2018-07-05 DIAGNOSIS — Z9221 Personal history of antineoplastic chemotherapy: Secondary | ICD-10-CM

## 2018-07-05 DIAGNOSIS — Z79899 Other long term (current) drug therapy: Secondary | ICD-10-CM

## 2018-07-05 LAB — CBC WITH DIFFERENTIAL/PLATELET
Abs Immature Granulocytes: 0.38 10*3/uL — ABNORMAL HIGH (ref 0.00–0.07)
Basophils Absolute: 0 10*3/uL (ref 0.0–0.1)
Basophils Relative: 0 %
Eosinophils Absolute: 0 10*3/uL (ref 0.0–0.5)
Eosinophils Relative: 0 %
HCT: 29.8 % — ABNORMAL LOW (ref 36.0–46.0)
Hemoglobin: 9.5 g/dL — ABNORMAL LOW (ref 12.0–15.0)
Immature Granulocytes: 2 %
Lymphocytes Relative: 3 %
Lymphs Abs: 0.5 10*3/uL — ABNORMAL LOW (ref 0.7–4.0)
MCH: 27.3 pg (ref 26.0–34.0)
MCHC: 31.9 g/dL (ref 30.0–36.0)
MCV: 85.6 fL (ref 80.0–100.0)
Monocytes Absolute: 0.3 10*3/uL (ref 0.1–1.0)
Monocytes Relative: 2 %
Neutro Abs: 15.2 10*3/uL — ABNORMAL HIGH (ref 1.7–7.7)
Neutrophils Relative %: 93 %
Platelets: 401 10*3/uL — ABNORMAL HIGH (ref 150–400)
RBC: 3.48 MIL/uL — ABNORMAL LOW (ref 3.87–5.11)
RDW: 15.9 % — ABNORMAL HIGH (ref 11.5–15.5)
WBC: 16.4 10*3/uL — ABNORMAL HIGH (ref 4.0–10.5)
nRBC: 0.1 % (ref 0.0–0.2)

## 2018-07-05 LAB — COMPREHENSIVE METABOLIC PANEL
ALT: 21 U/L (ref 0–44)
AST: 19 U/L (ref 15–41)
Albumin: 2.8 g/dL — ABNORMAL LOW (ref 3.5–5.0)
Alkaline Phosphatase: 107 U/L (ref 38–126)
Anion gap: 11 (ref 5–15)
BUN: 20 mg/dL (ref 8–23)
CO2: 25 mmol/L (ref 22–32)
Calcium: 9.7 mg/dL (ref 8.9–10.3)
Chloride: 96 mmol/L — ABNORMAL LOW (ref 98–111)
Creatinine, Ser: 0.97 mg/dL (ref 0.44–1.00)
GFR calc Af Amer: >60 mL/min (ref >60)
GFR calc non Af Amer: >60 mL/min (ref >60)
Glucose, Bld: 174 mg/dL — ABNORMAL HIGH (ref 70–99)
Potassium: 4.5 mmol/L (ref 3.5–5.1)
Sodium: 132 mmol/L — ABNORMAL LOW (ref 135–145)
Total Bilirubin: 0.2 mg/dL — ABNORMAL LOW (ref 0.3–1.2)
Total Protein: 7.4 g/dL (ref 6.5–8.1)

## 2018-07-05 LAB — SAMPLE TO BLOOD BANK

## 2018-07-05 LAB — MAGNESIUM: Magnesium: 1.8 mg/dL (ref 1.7–2.4)

## 2018-07-05 MED ORDER — ALTEPLASE 2 MG IJ SOLR
2.0000 mg | Freq: Once | INTRAMUSCULAR | Status: AC | PRN
  Administered 2018-07-05: 2 mg
  Filled 2018-07-05: qty 2

## 2018-07-05 MED ORDER — SODIUM CHLORIDE 0.9% FLUSH
10.0000 mL | Freq: Once | INTRAVENOUS | Status: AC
  Administered 2018-07-05: 11:00:00 10 mL
  Filled 2018-07-05: qty 10

## 2018-07-05 MED ORDER — HYDROMORPHONE HCL 4 MG PO TABS: 4.0000 mg | ORAL_TABLET | ORAL | 0 refills | Status: DC | PRN

## 2018-07-05 MED ORDER — PROMETHAZINE HCL 25 MG/ML IJ SOLN
25.0000 mg | Freq: Once | INTRAMUSCULAR | Status: AC
  Administered 2018-07-05: 25 mg via INTRAVENOUS

## 2018-07-05 MED ORDER — PROMETHAZINE HCL 25 MG/ML IJ SOLN
INTRAMUSCULAR | Status: AC
  Filled 2018-07-05: qty 1

## 2018-07-05 MED ORDER — SODIUM CHLORIDE 0.9% FLUSH
10.0000 mL | Freq: Once | INTRAVENOUS | Status: AC | PRN
  Administered 2018-07-05: 15:00:00 10 mL
  Filled 2018-07-05: qty 10

## 2018-07-05 MED ORDER — SODIUM CHLORIDE 0.9 % IV SOLN
Freq: Once | INTRAVENOUS | Status: AC
  Administered 2018-07-05: 13:00:00 via INTRAVENOUS
  Filled 2018-07-05: qty 250

## 2018-07-05 MED ORDER — HEPARIN SOD (PORK) LOCK FLUSH 100 UNIT/ML IV SOLN
500.0000 [IU] | Freq: Once | INTRAVENOUS | Status: AC | PRN
  Administered 2018-07-05: 500 [IU]
  Filled 2018-07-05: qty 5

## 2018-07-05 MED ORDER — ALTEPLASE 2 MG IJ SOLR
INTRAMUSCULAR | Status: AC
  Filled 2018-07-05: qty 2

## 2018-07-05 NOTE — Progress Notes (Signed)
Notified Toni Peterson of PET scan appointment on 07/11/18 at 4:00 (arrive at 3:30, npo 6 hours before, no carbs after midnight).  She verbalized understanding.

## 2018-07-05 NOTE — Assessment & Plan Note (Signed)
She continues to have significant changes in bowel habits which I suspect is due to peritoneal disease She will continue aggressive laxative therapy Today, she felt nauseated and dehydrated I recommend IV fluids and IV antiemetic support

## 2018-07-05 NOTE — Assessment & Plan Note (Signed)
She has poorly controlled pain She declined methadone and morphine sulfate due to poor tolerance I recommend a trial of Dilaudid I warned her about risk of sedation, nausea and constipation

## 2018-07-05 NOTE — Assessment & Plan Note (Signed)
Unfortunately, she has clinical signs and symptoms worrisome for disease progression She have lost over 20 pounds since last time I saw her I am concerned I recommend against chemotherapy today and will give her IV fluids and IV antiemetics for supportive care only I will get her scheduled for PET CT scan as soon as possible and will bring her back next week to review test results

## 2018-07-05 NOTE — Assessment & Plan Note (Signed)
She is not symptomatic from anemia Observe only

## 2018-07-05 NOTE — Progress Notes (Signed)
[place] OFFICE PROGRESS NOTE  Patient Care Team: Antony Contras, MD as PCP - General (Family Medicine) Minus Breeding, MD as PCP - Cardiology (Cardiology) Awanda Mink Craige Cotta, RN as Oncology Nurse Navigator (Oncology)  ASSESSMENT & PLAN:  Cervical cancer [facility]) Unfortunately, she has clinical signs and symptoms worrisome for disease progression She have lost over 20 pounds since last time I saw her I am concerned I recommend against chemotherapy today and will give her IV fluids and IV antiemetics for supportive care only I will get her scheduled for PET CT scan as soon as possible and will bring her back next week to review test results  Cancer associated pain She has poorly controlled pain She declined methadone and morphine sulfate due to poor tolerance I recommend a trial of Dilaudid I warned her about risk of sedation, nausea and constipation  Other constipation She continues to have significant changes in bowel habits which 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   Orders Placed This Encounter  Procedures  . NM PET Image Restag (PS) Skull Base To Thigh    Standing Status:   Future    Standing Expiration Date:   07/05/2019    Order Specific Question:   If indicated for the ordered procedure, I authorize the administration of a radiopharmaceutical per Radiology protocol     Answer:   Yes    Order Specific Question:   Preferred imaging location?    Answer:   [facility]    Order Specific Question:   Radiology Contrast Protocol - do NOT remove file path    Answer:   [email or web address]    INTERVAL HISTORY: Please see below for problem oriented charting. She returns for cycle 3 of chemotherapy She have lost 20 pounds since last time I saw her due to symptoms of nausea, occasional vomiting, changes in bowel habits with diarrhea alternate with constipation and overall feeling unwell She felt better today after she took some steroids as premedication before chemotherapy She did not tolerate methadone at all for pain control She denies bone pain today, again, attributing that to the benefits with dexamethasone She continues to have poor sleep She continues to experience vaginal discharge Denies fever or chills  SUMMARY OF ONCOLOGIC HISTORY: Oncology History   MSI - Stable on cervix biopsy PD-L1 2%  Bone biopsy is consistent with metastatic GYN primary MMR protein MLH1 and PMS2 abnormalities noted on colon biopsy Genetics are neg     Cervical cancer ([place])   09/08/2017 Initial Diagnosis    Patient has a history of symptomatic endometrial fibroids in 2013 for which she underwent hysteroscopic resection with benign pathology.  She has had lifelong normal Pap smears including in 2019.  Of note HPV testing was not performed in this Pap in 2019.  She developed symptoms of very light vaginal spotting in August 2019     11/22/2017 Procedure    She was  seen by her gynecologist, Dr. Radene Knee, who performed a pelvic examination.  On physical examination and endocervical mass was appreciated and felt to be likely to be a polyp or fibroid was biopsied on 11/22/17.       11/22/2017 Pathology Results    This revealed poorly differentiated invasive carcinoma.  Immunostains revealed that it was CK7 positive, P 16+, p53 positive, PAX 8+ and CK  5 6 weakly positive.  CK 20, ER PR and p53 immunostains were negative.  The morphology and Immuno profile favored a gynecologic primary with endocervical and endometrial adenocarcinoma in the differential diagnosis.     11/22/2017 Imaging    An ultrasound scan on November 22, 2017 revealed several intramural fibroids.  With saline infusion there was a 1.8 cm density within the endometrial cavity which could be a fibroid or polyp.     12/08/2017 Imaging    PET:  1. Marked hypermetabolism involving the cervix and uterus. Difficult to be certain of the origin but favor cervix. No evidence of parametrial or parauterine tumor and no pelvic or retroperitoneal hypermetabolic lymphadenopathy. 2. L5 vertebral body lesion worrisome for metastasis. 3. Hypermetabolic area involving the hepatic flexure region of the colon is worrisome for colon cancer. Recommend colonoscopy. 4. Benign left adrenal gland adenoma. 5. Symmetric hypermetabolism in the tongue base and tonsillar regions is likely inflamed lymphoid tissue. No neck mass or adenopathy.    12/19/2017 Imaging    1. Diffuse marrow replacement in the L5 vertebral body, indeterminate though metastatic disease is a concern. Correlate with upcoming biopsy. No extraosseous tumor or pathologic fracture. 2. Marrow signal abnormality on both sides of the T11-12 disc space favored to be degenerative. 3. Severe L4-5 facet arthrosis with grade 1 anterolisthesis, mild bilateral lateral recess stenosis, and mild-to-moderate bilateral neural foraminal stenosis. Findings may worsen with standing. 4. Moderate bilateral neural foraminal stenosis at L5-S1. 5. Partially visualized uterine masses which may reflect a combination of known adenocarcinoma and fibroids. 4 cm right adnexal mass may reflect an exophytic subserosal fibroid or ovarian neoplasm.    12/20/2017 Echocardiogram    LV EF: 60% -   65%    12/23/2017 Procedure    Successful placement of a right IJ  approach Power Port with ultrasound and fluoroscopic guidance. The catheter is ready for use.    12/23/2017 Pathology Results    Bone, biopsy, L5 - METASTATIC POORLY DIFFERENTIATED CARCINOMA. - SEE COMMENT. Microscopic Comment Dr. Vic Ripper has reviewed the case and concurs with this interpretation. The case was discussed wtih Joylene John on 12/22/2017. Per request, a block will be sent for PDL1 testing and the results reported separately    01/11/2018 Cancer Staging    Staging form: Cervix Uteri, AJCC 8th Edition - Clinical: FIGO Stage IVB (cT2, cN0, pM1) - Signed by Heath Lark, MD on 01/11/2018    01/16/2018 - 03/27/2018 Radiation Therapy    Radiation treatment dates:   1. 01/16/18-02/21/18 (external beam)                                                   2. 02/28/18, 03/07/18, 03/16/18, 03/20/18, 03/27/18 (brachytherapy treatments)  Site/dose:   1. Pelvis; 25 fractions of 1.8 Gy for a total of 45 Gy (including solitary bone metastasis at L5)  2. Cervix; 5 fractions of 5.5 Gy for a total of 27.5 Gy  Beams/energy:   1. Photon 3D; 15X                              2. HDR Ir-192 brachytherapy, tandem/ring system for treatment    01/17/2018 - 02/22/2018 Chemotherapy    The patient had weekly cisplatin    05/17/2018 PET scan    1. Response to therapy of cervical primary. Extensive pelvic edema and low-level hypermetabolism, likely radiation induced. New endocervical and endometrial air may relate to necrosis from radiation therapy. Endometritis cannot be excluded. 2. Given limitations of diffuse pelvic hypermetabolism, no hypermetabolic pelvic nodes identified. No evidence of distant soft tissue metastasis. 3. Progressive osseous metastasis. 4. Small volume abdominopelvic ascites with diffuse peritoneal/omental thickening, nonspecific. Recommend attention on follow-up to exclude early omental/peritoneal metastasis. 5. Coronary artery atherosclerosis. Aortic Atherosclerosis  (ICD10-I70.0).    05/24/2018 -  Chemotherapy    The patient had palonosetron (ALOXI) injection 0.25 mg, 0.25 mg, Intravenous,  Once, 2 of 6 cycles Administration: 0.25 mg (05/24/2018), 0.25 mg (06/14/2018) CARBOplatin (PARAPLATIN) 470 mg in sodium chloride 0.9 % 250 mL chemo infusion, 470 mg, Intravenous,  Once, 2 of 6 cycles Administration: 470 mg (05/24/2018), 370 mg (06/14/2018) PACLitaxel (TAXOL) 186 mg in sodium chloride 0.9 % 250 mL chemo infusion (> '80mg'$ /m2), 101.25 mg/m2 = 186 mg (75 % of original dose 135 mg/m2), Intravenous,  Once, 2 of 6 cycles Dose modification: 101.25 mg/m2 (75 % of original dose 135 mg/m2, Cycle 1, Reason: Provider Judgment) Administration: 186 mg (05/24/2018), 186 mg (06/14/2018) fosaprepitant (EMEND) 150 mg, dexamethasone (DECADRON) 12 mg in sodium chloride 0.9 % 145 mL IVPB, , Intravenous,  Once, 2 of 6 cycles Administration:  (05/24/2018),  (06/14/2018)  for chemotherapy treatment.      Metastasis to bone ([place])   12/28/2017 Initial Diagnosis    Metastasis to bone ([place])    05/24/2018 -  Chemotherapy    The patient had palonosetron (ALOXI) injection 0.25 mg, 0.25 mg, Intravenous,  Once, 2 of 6 cycles Administration: 0.25 mg (05/24/2018), 0.25 mg (06/14/2018) CARBOplatin (PARAPLATIN) 470 mg in sodium chloride 0.9 % 250 mL chemo infusion, 470 mg, Intravenous,  Once, 2 of 6 cycles Administration: 470 mg (05/24/2018), 370 mg (06/14/2018) PACLitaxel (TAXOL) 186 mg in sodium chloride 0.9 % 250 mL chemo infusion (> '80mg'$ /m2), 101.25 mg/m2 = 186 mg (75 % of original dose 135 mg/m2), Intravenous,  Once, 2 of 6 cycles Dose modification: 101.25 mg/m2 (75 % of original dose 135 mg/m2, Cycle 1, Reason: Provider Judgment) Administration: 186 mg (05/24/2018), 186 mg (06/14/2018) fosaprepitant (EMEND) 150 mg, dexamethasone (DECADRON) 12 mg in sodium chloride 0.9 % 145 mL IVPB, , Intravenous,  Once, 2 of 6 cycles Administration:  (05/24/2018),  (06/14/2018)  for chemotherapy treatment.       Colon cancer ([place])   01/11/2018 Procedure    - Hemorrhoids found on perianal exam. - Malignant partially obstructing tumor at the hepatic flexure. Biopsied. Tattooed. - Two 2 to 5 mm polyps in the sigmoid colon, removed with a cold snare. Resected and retrieved. - Diverticulosis in the sigmoid colon, in the distal descending colon and in the ascending colon. - Non-bleeding internal hemorrhoids. - The examined portion of the ileum was normal.    01/11/2018 Pathology Results    1. Colon, biopsy, hepatic flexure - ADENOCARCINOMA, SEE COMMENT. 2. Colon, polyp(s), sigmoid, polyp (2) - HYPERPLASTIC POLYP (X2  FRAGMENTS). - NO DYSPLASIA OR MALIGNANCY. Microscopic Comment 1. There are additional fragments of tubular adenoma suggesting this is a primary colon adenocarcinoma.     02/07/2018 Cancer Staging    Staging form: Colon and Rectum, AJCC 8th Edition - Clinical: Stage I (cT1, cN0, cM0) - Signed by Heath Lark, MD on 02/07/2018     Genetic Testing    Negative genetic testing on the CancerNext panel.  The CancerNext gene panel offered by [employer] includes sequencing and rearrangement analysis for the following 34 genes:   APC, ATM, BARD1, BMPR1A, BRCA1, BRCA2, BRIP1, CDH1, CDK4, CDKN2A, CHEK2, DICER1, HOXB13, EPCAM, GREM1, MLH1, MRE11A, MSH2, MSH6, MUTYH, NBN, NF1, PALB2, PMS2, POLD1, POLE, PTEN, RAD50, RAD51C, RAD51D, SMAD4, SMARCA4, STK11, and TP53.  The report date is April 05, 2018.     REVIEW OF SYSTEMS:   Constitutional: Denies fevers, chills or abnormal weight loss Eyes: Denies blurriness of vision Ears, nose, mouth, throat, and face: Denies mucositis or sore throat Respiratory: Denies cough, dyspnea or wheezes Cardiovascular: Denies palpitation, chest discomfort or lower extremity swelling Skin: Denies abnormal skin rashes Lymphatics: Denies new lymphadenopathy or easy bruising Neurological:Denies numbness, tingling or new weaknesses Behavioral/Psych: Mood is stable, no  new changes  All other systems were reviewed with the patient and are negative.  I have reviewed the past medical history, past surgical history, social history and family history with the patient and they are unchanged from previous note.  ALLERGIES:  has No Known Allergies.  MEDICATIONS:  Current Outpatient Medications  Medication Sig Dispense Refill  . amLODipine (NORVASC) 10 MG tablet Take 10 mg by mouth every evening.     Marland Kitchen atenolol (TENORMIN) 50 MG tablet Take 50 mg by mouth every evening.   1  . Cholecalciferol (VITAMIN D3) 5000 units CAPS Take 5,000 Units by mouth daily.     Marland Kitchen dexamethasone (DECADRON) 4 MG tablet Take 3 tabs at the night before and 3 tab the morning of chemotherapy, every 3 weeks, by mouth 36 tablet 0  . fish oil-omega-3 fatty acids 1000 MG capsule Take 1 g by mouth daily.     . hydrocortisone (ANUSOL-HC) 2.5 % rectal cream Apply 1 application topically 2 (two) times daily. 28.35 g 0  . HYDROmorphone (DILAUDID) 4 MG tablet Take 1 tablet (4 mg total) by mouth every 4 (four) hours as needed for severe pain. 30 tablet 0  . lidocaine-prilocaine (EMLA) cream Apply to affected area once 30 g 3  . lidocaine-prilocaine (EMLA) cream Apply to affected area once 30 g 3  . mirtazapine (REMERON) 30 MG tablet TAKE 1 TABLET (30 MG TOTAL) BY MOUTH AT BEDTIME. 90 tablet 4  . Multiple Vitamin (MULTIVITAMIN WITH MINERALS) TABS tablet Take 1 tablet by mouth daily.    . ondansetron (ZOFRAN) 8 MG tablet Take 1 tablet (8 mg total) by mouth every 8 (eight) hours as needed. Start on the third day after chemotherapy. 30 tablet 1  . pramoxine (PROCTOFOAM) 1 % foam Place 1 application rectally 3 (three) times daily as needed for anal itching. 15 g 2  . prochlorperazine (COMPAZINE) 10 MG tablet Take 1 tablet (10 mg total) by mouth every 6 (six) hours as needed (Nausea or vomiting). 30 tablet 1   No current facility-administered medications for this visit.    Facility-Administered Medications  Ordered in Other Visits  Medication Dose Route Frequency Provider Last Rate Last Dose  . sodium chloride flush (NS) 0.9 % injection 10 mL  10 mL Intravenous PRN Alvy Bimler,  Nadya [place], MD   10 mL at 06/23/18 1112    PHYSICAL EXAMINATION: ECOG PERFORMANCE STATUS: 2 - Symptomatic, <50% confined to bed  Vitals:   07/05/18 1056  BP: (!) 161/86  Pulse: 71  Resp: 18  Temp: 98 F (36.7 C)  SpO2: 100%   Filed Weights   07/05/18 1056  Weight: 151 lb 12.8 oz (68.9 kg)    GENERAL:alert, no distress and comfortable Musculoskeletal:no cyanosis of digits and no clubbing  NEURO: alert & oriented x 3 with fluent speech, no focal motor/sensory deficits  LABORATORY DATA:  I have reviewed the data as listed    Component Value Date/Time   NA 132 (L) 07/05/2018 1044   K 4.5 07/05/2018 1044   CL 96 (L) 07/05/2018 1044   CO2 25 07/05/2018 1044   GLUCOSE 174 (H) 07/05/2018 1044   BUN 20 07/05/2018 1044   CREATININE 0.97 07/05/2018 1044   CREATININE 0.81 05/08/2018 1135   CALCIUM 9.7 07/05/2018 1044   PROT 7.4 07/05/2018 1044   ALBUMIN 2.8 (L) 07/05/2018 1044   AST 19 07/05/2018 1044   AST 12 (L) 05/08/2018 1135   ALT 21 07/05/2018 1044   ALT 12 05/08/2018 1135   ALKPHOS 107 07/05/2018 1044   BILITOT 0.2 (L) 07/05/2018 1044   BILITOT 0.4 05/08/2018 1135   GFRNONAA >60 07/05/2018 1044   GFRNONAA >60 05/08/2018 1135   GFRAA >60 07/05/2018 1044   GFRAA >60 05/08/2018 1135    No results found for: SPEP, UPEP  Lab Results  Component Value Date   WBC 16.4 (H) 07/05/2018   NEUTROABS 15.2 (H) 07/05/2018   HGB 9.5 (L) 07/05/2018   HCT 29.8 (L) 07/05/2018   MCV 85.6 07/05/2018   PLT 401 (H) 07/05/2018      Chemistry      Component Value Date/Time   NA 132 (L) 07/05/2018 1044   K 4.5 07/05/2018 1044   CL 96 (L) 07/05/2018 1044   CO2 25 07/05/2018 1044   BUN 20 07/05/2018 1044   CREATININE 0.97 07/05/2018 1044   CREATININE 0.81 05/08/2018 1135      Component Value Date/Time   CALCIUM  9.7 07/05/2018 1044   ALKPHOS 107 07/05/2018 1044   AST 19 07/05/2018 1044   AST 12 (L) 05/08/2018 1135   ALT 21 07/05/2018 1044   ALT 12 05/08/2018 1135   BILITOT 0.2 (L) 07/05/2018 1044   BILITOT 0.4 05/08/2018 1135      All questions were answered. The patient knows to call the clinic with any problems, questions or concerns. No barriers to learning was detected.  I spent 30 minutes counseling the patient face to face. The total time spent in the appointment was 40 minutes and more than 50% was on counseling and review of test results  Heath Lark, MD 07/05/2018 11:34 AM

## 2018-07-05 NOTE — Assessment & Plan Note (Signed)
She has profound malignant cachexia and have lost over 20 pounds since last time I saw her She felt that Remeron has not been very helpful I recommend a trial of daily dexamethasone I will call her in 2 days If she cannot tolerate dexamethasone or not finding that helpful, I might prescribe something different such as Megace or Marinol.

## 2018-07-05 NOTE — Patient Instructions (Signed)

## 2018-07-10 ENCOUNTER — Telehealth: Payer: Self-pay | Admitting: Oncology

## 2018-07-10 NOTE — Telephone Encounter (Signed)
Called Ms. Toni Peterson and advised her that PET scan has been rescheduled to 07/14/18 at 10 am (9:30 arrival). Also rescheduled her appointment with Dr. Alvy Bimler to 07/18/18 at 2 pm.  She verbalized understanding and agreement.

## 2018-07-11 ENCOUNTER — Ambulatory Visit (HOSPITAL_COMMUNITY): Payer: BC Managed Care – PPO

## 2018-07-12 ENCOUNTER — Other Ambulatory Visit: Payer: Self-pay | Admitting: Oncology

## 2018-07-12 ENCOUNTER — Ambulatory Visit: Payer: BC Managed Care – PPO | Admitting: Hematology and Oncology

## 2018-07-12 NOTE — Telephone Encounter (Signed)
Toni Peterson left a message asking for a refill of dexamethasone.  She has 5 tablets left and has been taking 1.5 tablets daily for her appetite.   She would like it sent to [facility] on [address].

## 2018-07-13 ENCOUNTER — Other Ambulatory Visit: Payer: Self-pay | Admitting: Hematology and Oncology

## 2018-07-13 MED ORDER — DEXAMETHASONE 4 MG PO TABS: 4.0000 mg | ORAL_TABLET | Freq: Two times a day (BID) | ORAL | 1 refills | Status: AC

## 2018-07-13 NOTE — Telephone Encounter (Signed)
I changed the order to BID so she can get more available

## 2018-07-13 NOTE — Telephone Encounter (Addendum)
Called Toni Peterson and let her know the refill for dexamethasone has been sent in.  She verbalized agreemen and then said that she has been having more vaginal discharge (going through 2 pads per day) and it is now brown in color.  She said this started last week.  She also said that she had one bowel movement 2 days ago where she saw some blood.  It did not happen again after that.  She is worried that her cancer is getting worse and that her PET scan will not be approved.

## 2018-07-14 ENCOUNTER — Other Ambulatory Visit: Payer: Self-pay

## 2018-07-14 ENCOUNTER — Ambulatory Visit (HOSPITAL_COMMUNITY)
Admission: RE | Admit: 2018-07-14 | Discharge: 2018-07-14 | Disposition: A | Discharge status: Home / Self Care | Payer: BC Managed Care – PPO | Source: Ambulatory Visit | Attending: Hematology and Oncology | Admitting: Hematology and Oncology

## 2018-07-14 DIAGNOSIS — C183 Malignant neoplasm of hepatic flexure: Secondary | ICD-10-CM | POA: Diagnosis present

## 2018-07-14 DIAGNOSIS — C530 Malignant neoplasm of endocervix: Secondary | ICD-10-CM | POA: Insufficient documentation

## 2018-07-14 DIAGNOSIS — C7951 Secondary malignant neoplasm of bone: Secondary | ICD-10-CM | POA: Diagnosis present

## 2018-07-14 LAB — GLUCOSE, CAPILLARY: Glucose-Capillary: 94 mg/dL (ref 70–99)

## 2018-07-14 MED ORDER — FLUDEOXYGLUCOSE F - 18 (FDG) INJECTION
7.5400 | Freq: Once | INTRAVENOUS | Status: AC | PRN
  Administered 2018-07-14: 7.54 via INTRAVENOUS

## 2018-07-17 ENCOUNTER — Inpatient Hospital Stay: Payer: BC Managed Care – PPO | Attending: Hematology and Oncology | Admitting: Hematology and Oncology

## 2018-07-17 ENCOUNTER — Telehealth: Payer: Self-pay | Admitting: Oncology

## 2018-07-17 ENCOUNTER — Other Ambulatory Visit: Payer: Self-pay

## 2018-07-17 DIAGNOSIS — Z7189 Other specified counseling: Secondary | ICD-10-CM

## 2018-07-17 DIAGNOSIS — C7951 Secondary malignant neoplasm of bone: Secondary | ICD-10-CM | POA: Diagnosis not present

## 2018-07-17 DIAGNOSIS — R634 Abnormal weight loss: Secondary | ICD-10-CM | POA: Insufficient documentation

## 2018-07-17 DIAGNOSIS — G893 Neoplasm related pain (acute) (chronic): Secondary | ICD-10-CM | POA: Insufficient documentation

## 2018-07-17 DIAGNOSIS — R64 Cachexia: Secondary | ICD-10-CM

## 2018-07-17 DIAGNOSIS — C539 Malignant neoplasm of cervix uteri, unspecified: Secondary | ICD-10-CM | POA: Insufficient documentation

## 2018-07-17 DIAGNOSIS — C530 Malignant neoplasm of endocervix: Secondary | ICD-10-CM

## 2018-07-17 MED ORDER — ONDANSETRON HCL 8 MG PO TABS: 8.0000 mg | ORAL_TABLET | Freq: Three times a day (TID) | ORAL | 1 refills | Status: DC | PRN

## 2018-07-17 NOTE — Telephone Encounter (Signed)
Toni Peterson left a message on 07/13/18 at 8 pm saying that she had some bright red vaginal bleeding with some cramping.  She is wondering if her cancer is getting worse. She just wanted to let Dr. Alvy Bimler know.  Called her back and she said she has not had any bleeding since Thursday.  The discharge is now back to a brown color.

## 2018-07-17 NOTE — Telephone Encounter (Signed)
Called Toni Peterson back and rescheduled her appointment to today at 2 pm.

## 2018-07-17 NOTE — Telephone Encounter (Signed)
I can see her today if she is willing We move her appt tomorrow because she wants to work

## 2018-07-18 ENCOUNTER — Encounter: Payer: Self-pay | Admitting: Hematology and Oncology

## 2018-07-18 ENCOUNTER — Ambulatory Visit: Payer: BC Managed Care – PPO | Admitting: Hematology and Oncology

## 2018-07-18 NOTE — Assessment & Plan Note (Signed)
She has malignant cachexia, 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 She is attempting to increase nutritional supplement as tolerated

## 2018-07-18 NOTE — Assessment & Plan Note (Signed)
Unfortunately, she has significant disease progression She is also symptomatic with recent weight loss, bone pain, significant vaginal discharge and nausea/changes in bowel habits due to widespread disease We discussed the goals of care I reviewed with her the current guidelines She is concerned about losing hair with certain types of chemotherapy I briefly discussed other alternative options such as topotecan, gemcitabine or vincristine Expected rates of response will be less than 20%, at the expense of worsening quality of life We also discussed alternative treatment with palliative care/hospice She is undecided We discussed prognosis I will call her at the end of the week per patient request to see what her final plan is

## 2018-07-18 NOTE — Progress Notes (Signed)
[place] OFFICE PROGRESS NOTE  Patient Care Team: Toni Contras, MD as PCP - General (Family Medicine) Minus Breeding, MD as PCP - Cardiology (Cardiology) Awanda Mink Craige Cotta, RN as Oncology Nurse Navigator (Oncology)  ASSESSMENT & PLAN:  Cervical cancer Toni Peterson) Unfortunately, she has significant disease progression She is also symptomatic with recent weight loss, bone pain, significant vaginal discharge and nausea/changes in bowel habits due to widespread disease We discussed the goals of care I reviewed with her the current guidelines She is concerned about losing hair with certain types of chemotherapy I briefly discussed other alternative options such as topotecan, gemcitabine or vincristine Expected rates of response will be less than 20%, at the expense of worsening quality of life We also discussed alternative treatment with palliative care/hospice She is undecided We discussed prognosis I will call her at the end of the week per patient request to see what her final plan is  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, 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 She is attempting to increase nutritional supplement as tolerated  Cancer associated pain She tolerated pain medication poorly due to worsening nausea/constipation She is only taking acetaminophen This is despite trying multiple different agents including oxycodone, morphine or Dilaudid  Goals of care, counseling/discussion We discussed prognosis I estimated she has less than 6 months Even with treatment, at most, she would have 6 to 9 months of longevity I recommend her to stop working and focus on quality of life and time with family The patient is undecided I will call her at the end of the week for further follow-up   No orders of the defined types were placed in this encounter.   INTERVAL HISTORY: Please see below for problem oriented charting. She is seen today to  review PET CT scan result She continues to have decline in performance status with abdominal pain, changes in bowel habits, vaginal discharge and poor appetite  SUMMARY OF ONCOLOGIC HISTORY: Oncology History   MSI - Stable on cervix biopsy PD-L1 2%  Bone biopsy is consistent with metastatic GYN primary MMR protein MLH1 and PMS2 abnormalities noted on colon biopsy Genetics are neg  Progressed on cisplatin, carboplatin and Taxol     Cervical cancer ([name])   09/08/2017 Initial Diagnosis    Patient has a history of symptomatic endometrial fibroids in 2013 for which she underwent hysteroscopic resection with benign pathology.  She has had lifelong normal Pap smears including in 2019.  Of note HPV testing was not performed in this Pap in 2019.  She developed symptoms of very light vaginal spotting in August 2019     11/22/2017 Procedure    She was seen by her gynecologist, Dr. Radene Knee, who performed a pelvic examination.  On physical examination and endocervical mass was appreciated and felt to be likely to be a polyp or fibroid was biopsied on 11/22/17.       11/22/2017 Pathology Results    This revealed poorly differentiated invasive carcinoma.  Immunostains revealed that it was CK7 positive, P 16+, p53 positive, PAX 8+ and CK 5 6 weakly positive.  CK 20, ER PR and p53 immunostains were negative.  The morphology and Immuno profile favored a gynecologic primary with endocervical and endometrial adenocarcinoma in the differential diagnosis.     11/22/2017 Imaging    An ultrasound scan on November 22, 2017 revealed several intramural fibroids.  With saline infusion there was a 1.8 cm density within the endometrial cavity which could  be a fibroid or polyp.     12/08/2017 Imaging    PET:  1. Marked hypermetabolism involving the cervix and uterus. Difficult to be certain of the origin but favor cervix. No evidence of parametrial or parauterine tumor and no pelvic or retroperitoneal hypermetabolic  lymphadenopathy. 2. L5 vertebral body lesion worrisome for metastasis. 3. Hypermetabolic area involving the hepatic flexure region of the colon is worrisome for colon cancer. Recommend colonoscopy. 4. Benign left adrenal gland adenoma. 5. Symmetric hypermetabolism in the tongue base and tonsillar regions is likely inflamed lymphoid tissue. No neck mass or adenopathy.    12/19/2017 Imaging    1. Diffuse marrow replacement in the L5 vertebral body, indeterminate though metastatic disease is a concern. Correlate with upcoming biopsy. No extraosseous tumor or pathologic fracture. 2. Marrow signal abnormality on both sides of the T11-12 disc space favored to be degenerative. 3. Severe L4-5 facet arthrosis with grade 1 anterolisthesis, mild bilateral lateral recess stenosis, and mild-to-moderate bilateral neural foraminal stenosis. Findings may worsen with standing. 4. Moderate bilateral neural foraminal stenosis at L5-S1. 5. Partially visualized uterine masses which may reflect a combination of known adenocarcinoma and fibroids. 4 cm right adnexal mass may reflect an exophytic subserosal fibroid or ovarian neoplasm.    12/20/2017 Echocardiogram    LV EF: 60% -   65%    12/23/2017 Procedure    Successful placement of a right IJ approach Power Port with ultrasound and fluoroscopic guidance. The catheter is ready for use.    12/23/2017 Pathology Results    Bone, biopsy, L5 - METASTATIC POORLY DIFFERENTIATED CARCINOMA. - SEE COMMENT. Microscopic Comment Dr. Vic Ripper has reviewed the case and concurs with this interpretation. The case was discussed wtih Joylene John on 12/22/2017. Per request, a block will be sent for PDL1 testing and the results reported separately    01/11/2018 Cancer Staging    Staging form: Cervix Uteri, AJCC 8th Edition - Clinical: FIGO Stage IVB (cT2, cN0, pM1) - Signed by Heath Lark, MD on 01/11/2018    01/16/2018 - 03/27/2018 Radiation Therapy    Radiation treatment dates:    1. 01/16/18-02/21/18 (external beam)                                                   2. 02/28/18, 03/07/18, 03/16/18, 03/20/18, 03/27/18 (brachytherapy treatments)  Site/dose:   1. Pelvis; 25 fractions of 1.8 Gy for a total of 45 Gy (including solitary bone metastasis at L5)                      2. Cervix; 5 fractions of 5.5 Gy for a total of 27.5 Gy  Beams/energy:   1. Photon 3D; 15X                              2. HDR Ir-192 brachytherapy, tandem/ring system for treatment    01/17/2018 - 02/22/2018 Chemotherapy    The patient had weekly cisplatin    05/17/2018 PET scan    1. Response to therapy of cervical primary. Extensive pelvic edema and low-level hypermetabolism, likely radiation induced. New endocervical and endometrial air may relate to necrosis from radiation therapy. Endometritis cannot be excluded. 2. Given limitations of diffuse pelvic hypermetabolism, no hypermetabolic pelvic nodes identified. No evidence of distant soft tissue  metastasis. 3. Progressive osseous metastasis. 4. Small volume abdominopelvic ascites with diffuse peritoneal/omental thickening, nonspecific. Recommend attention on follow-up to exclude early omental/peritoneal metastasis. 5. Coronary artery atherosclerosis. Aortic Atherosclerosis (ICD10-I70.0).    05/24/2018 - 06/14/2018 Chemotherapy    The patient had carboplatin and taxol x 2 cycles for chemotherapy treatment.     07/14/2018 PET scan    1. Persistent areas of hypermetabolism throughout the endometrial lining of the uterus, cervix and upper vagina, similar to the prior examination, as above. Importantly, today's study demonstrates increasing haziness and nodularity in the omentum with some associated hypermetabolism, concerning for developing peritoneal spread of disease. There is also slightly increased ascites compared to the prior examination. 2. Widespread metastatic disease to the bones appears similar to the prior study. 3. Stable small amount of  asymmetric mural thickening in the hepatic flexure of the colon with focal hypermetabolism, similar to prior examinations, concerning for primary colonic neoplasm. Correlation with nonemergent colonoscopy could be considered if clinically appropriate. 4. New trace right pleural effusion. 5. Additional incidental findings, as above.     Metastasis to bone ([place])   12/28/2017 Initial Diagnosis    Metastasis to bone ([place])    05/24/2018 -  Chemotherapy    The patient had palonosetron (ALOXI) injection 0.25 mg, 0.25 mg, Intravenous,  Once, 2 of 6 cycles Administration: 0.25 mg (05/24/2018), 0.25 mg (06/14/2018) CARBOplatin (PARAPLATIN) 470 mg in sodium chloride 0.9 % 250 mL chemo infusion, 470 mg, Intravenous,  Once, 2 of 6 cycles Administration: 470 mg (05/24/2018), 370 mg (06/14/2018) PACLitaxel (TAXOL) 186 mg in sodium chloride 0.9 % 250 mL chemo infusion (> '80mg'$ /m2), 101.25 mg/m2 = 186 mg (75 % of original dose 135 mg/m2), Intravenous,  Once, 2 of 6 cycles Dose modification: 101.25 mg/m2 (75 % of original dose 135 mg/m2, Cycle 1, Reason: Provider Judgment) Administration: 186 mg (05/24/2018), 186 mg (06/14/2018) fosaprepitant (EMEND) 150 mg, dexamethasone (DECADRON) 12 mg in sodium chloride 0.9 % 145 mL IVPB, , Intravenous,  Once, 2 of 6 cycles Administration:  (05/24/2018),  (06/14/2018)  for chemotherapy treatment.      Colon cancer ([place])   01/11/2018 Procedure    - Hemorrhoids found on perianal exam. - Malignant partially obstructing tumor at the hepatic flexure. Biopsied. Tattooed. - Two 2 to 5 mm polyps in the sigmoid colon, removed with a cold snare. Resected and retrieved. - Diverticulosis in the sigmoid colon, in the distal descending colon and in the ascending colon. - Non-bleeding internal hemorrhoids. - The examined portion of the ileum was normal.    01/11/2018 Pathology Results    1. Colon, biopsy, hepatic flexure - ADENOCARCINOMA, SEE COMMENT. 2. Colon, polyp(s), sigmoid, polyp (2) -  HYPERPLASTIC POLYP (X2 FRAGMENTS). - NO DYSPLASIA OR MALIGNANCY. Microscopic Comment 1. There are additional fragments of tubular adenoma suggesting this is a primary colon adenocarcinoma.     02/07/2018 Cancer Staging    Staging form: Colon and Rectum, AJCC 8th Edition - Clinical: Stage I (cT1, cN0, cM0) - Signed by Heath Lark, MD on 02/07/2018     Genetic Testing    Negative genetic testing on the CancerNext panel.  The CancerNext gene panel offered by [employer] includes sequencing and rearrangement analysis for the following 34 genes:   APC, ATM, BARD1, BMPR1A, BRCA1, BRCA2, BRIP1, CDH1, CDK4, CDKN2A, CHEK2, DICER1, HOXB13, EPCAM, GREM1, MLH1, MRE11A, MSH2, MSH6, MUTYH, NBN, NF1, PALB2, PMS2, POLD1, POLE, PTEN, RAD50, RAD51C, RAD51D, SMAD4, SMARCA4, STK11, and TP53.  The report date is April 05, 2018.  REVIEW OF SYSTEMS:   Constitutional: Denies fevers, chills or abnormal weight loss Eyes: Denies blurriness of vision Ears, nose, mouth, throat, and face: Denies mucositis or sore throat Respiratory: Denies cough, dyspnea or wheezes Cardiovascular: Denies palpitation, chest discomfort or lower extremity swelling Gastrointestinal:  Denies nausea, heartburn or change in bowel habits Skin: Denies abnormal skin rashes Lymphatics: Denies new lymphadenopathy or easy bruising Neurological:Denies numbness, tingling or new weaknesses Behavioral/Psych: Mood is stable, no new changes  All other systems were reviewed with the patient and are negative.  I have reviewed the past medical history, past surgical history, social history and family history with the patient and they are unchanged from previous note.  ALLERGIES:  has No Known Allergies.  MEDICATIONS:  Current Outpatient Medications  Medication Sig Dispense Refill  . amLODipine (NORVASC) 10 MG tablet Take 10 mg by mouth every evening.     Marland Kitchen atenolol (TENORMIN) 50 MG tablet Take 50 mg by mouth every evening.   1  .  Cholecalciferol (VITAMIN D3) 5000 units CAPS Take 5,000 Units by mouth daily.     Marland Kitchen dexamethasone (DECADRON) 4 MG tablet Take 1 tablet (4 mg total) by mouth 2 (two) times daily. 60 tablet 1  . fish oil-omega-3 fatty acids 1000 MG capsule Take 1 g by mouth daily.     . hydrocortisone (ANUSOL-HC) 2.5 % rectal cream Apply 1 application topically 2 (two) times daily. 28.35 g 0  . HYDROmorphone (DILAUDID) 4 MG tablet Take 1 tablet (4 mg total) by mouth every 4 (four) hours as needed for severe pain. 30 tablet 0  . lidocaine-prilocaine (EMLA) cream Apply to affected area once 30 g 3  . lidocaine-prilocaine (EMLA) cream Apply to affected area once 30 g 3  . mirtazapine (REMERON) 30 MG tablet TAKE 1 TABLET (30 MG TOTAL) BY MOUTH AT BEDTIME. 90 tablet 4  . Multiple Vitamin (MULTIVITAMIN WITH MINERALS) TABS tablet Take 1 tablet by mouth daily.    . ondansetron (ZOFRAN) 8 MG tablet Take 1 tablet (8 mg total) by mouth every 8 (eight) hours as needed. 90 tablet 1  . pramoxine (PROCTOFOAM) 1 % foam Place 1 application rectally 3 (three) times daily as needed for anal itching. 15 g 2  . prochlorperazine (COMPAZINE) 10 MG tablet Take 1 tablet (10 mg total) by mouth every 6 (six) hours as needed (Nausea or vomiting). 30 tablet 1   No current facility-administered medications for this visit.    Facility-Administered Medications Ordered in Other Visits  Medication Dose Route Frequency Provider Last Rate Last Dose  . sodium chloride flush (NS) 0.9 % injection 10 mL  10 mL Intravenous PRN Alvy Bimler, Wilian Kwong, MD   10 mL at 06/23/18 1112    PHYSICAL EXAMINATION: ECOG PERFORMANCE STATUS: 2 - Symptomatic, <50% confined to bed  Vitals:   07/17/18 1400  BP: (!) 159/79  Pulse: (!) 18  Resp: 18  Temp: 98.7 F (37.1 C)  SpO2: 100%   Filed Weights   07/17/18 1400  Weight: 152 lb 6.4 oz (69.1 kg)    GENERAL:alert, no distress and comfortable Musculoskeletal:no cyanosis of digits and no clubbing  NEURO: alert &  oriented x 3 with fluent speech, no focal motor/sensory deficits  LABORATORY DATA:  I have reviewed the data as listed    Component Value Date/Time   NA 132 (L) 07/05/2018 1044   K 4.5 07/05/2018 1044   CL 96 (L) 07/05/2018 1044   CO2 25 07/05/2018 1044   GLUCOSE 174 (H) 07/05/2018  1044   BUN 20 07/05/2018 1044   CREATININE 0.97 07/05/2018 1044   CREATININE 0.81 05/08/2018 1135   CALCIUM 9.7 07/05/2018 1044   PROT 7.4 07/05/2018 1044   ALBUMIN 2.8 (L) 07/05/2018 1044   AST 19 07/05/2018 1044   AST 12 (L) 05/08/2018 1135   ALT 21 07/05/2018 1044   ALT 12 05/08/2018 1135   ALKPHOS 107 07/05/2018 1044   BILITOT 0.2 (L) 07/05/2018 1044   BILITOT 0.4 05/08/2018 1135   GFRNONAA >60 07/05/2018 1044   GFRNONAA >60 05/08/2018 1135   GFRAA >60 07/05/2018 1044   GFRAA >60 05/08/2018 1135    No results found for: SPEP, UPEP  Lab Results  Component Value Date   WBC 16.4 (H) 07/05/2018   NEUTROABS 15.2 (H) 07/05/2018   HGB 9.5 (L) 07/05/2018   HCT 29.8 (L) 07/05/2018   MCV 85.6 07/05/2018   PLT 401 (H) 07/05/2018      Chemistry      Component Value Date/Time   NA 132 (L) 07/05/2018 1044   K 4.5 07/05/2018 1044   CL 96 (L) 07/05/2018 1044   CO2 25 07/05/2018 1044   BUN 20 07/05/2018 1044   CREATININE 0.97 07/05/2018 1044   CREATININE 0.81 05/08/2018 1135      Component Value Date/Time   CALCIUM 9.7 07/05/2018 1044   ALKPHOS 107 07/05/2018 1044   AST 19 07/05/2018 1044   AST 12 (L) 05/08/2018 1135   ALT 21 07/05/2018 1044   ALT 12 05/08/2018 1135   BILITOT 0.2 (L) 07/05/2018 1044   BILITOT 0.4 05/08/2018 1135       RADIOGRAPHIC STUDIES: I have reviewed multiple imaging studies with the patient I have personally reviewed the radiological images as listed and agreed with the findings in the report. Nm Pet Image Restag (ps) Skull Base To Thigh  Result Date: 07/14/2018 CLINICAL DATA:  Subsequent treatment strategy for cervical carcinoma. EXAM: NUCLEAR MEDICINE PET  SKULL BASE TO THIGH TECHNIQUE: 7.5 mCi F-18 FDG was injected intravenously. Full-ring PET imaging was performed from the skull base to thigh after the radiotracer. CT data was obtained and used for attenuation correction and anatomic localization. Fasting blood glucose: 94 mg/dl COMPARISON:  PET/CT 05/17/2018. FINDINGS: Mediastinal blood pool activity: SUV max 2.7 Liver activity: SUV max 3.4 NECK: No hypermetabolic lymph nodes in the neck. Incidental CT findings: None. CHEST: No hypermetabolic mediastinal or hilar nodes. No suspicious pulmonary nodules on the CT scan. Incidental CT findings: Heart size is mildly enlarged. Atherosclerotic calcifications in the thoracic aorta as well as the left anterior descending coronary artery. Trace right pleural effusion lying dependently. Right internal jugular single-lumen porta cath with tip terminating in the superior cavoatrial junction. ABDOMEN/PELVIS: Hypermetabolism is again noted throughout the uterus, cervix and upper vagina, similar to the prior examination. Uterine hypermetabolism is most pronounced in the endometrial canal (SUVmax = 7.2 versus 6.0 on the prior examination). Cervical hypermetabolism is contiguous with hypermetabolism along the posterior wall of the upper vagina and is slightly more pronounced than the prior examination (SUVmax = 8.0 versus 6.1 on the prior). New increasing nodularity and haziness in the omentum with corresponding areas of hypermetabolism which have clearly increased compared to the prior examination (SUVmax = 5.1-6.1), concerning for developing peritoneal spread of disease. Asymmetric mural thickening and hypermetabolism (SUVmax = 12.4 versus 15.1 on the prior examination) in the hepatic flexure of the colon again noted. Incidental CT findings: Small amount of gas in the endometrial canal. Poor definition of soft  tissue planes in the low anatomic pelvis, similar to prior studies, presumably reflective of post radiation changes.  Asymmetric soft tissue thickening in the hepatic flexure of the colon best appreciated on axial image 107 of series 4, similar to prior examination. Increasing haziness and subtle nodularity in the omentum compared to the prior examination with corresponding hypermetabolism (discussed above), concerning for developing peritoneal spread of disease. Small volume of ascites and mild diffuse haziness in the small bowel mesentery. SKELETON: Numerous predominantly sclerotic osseous metastases are again noted throughout the visualized axial and appendicular skeleton, including a lesion in the proximal right humerus (SUVmax = 5.7 versus 6.4 on prior) and several vertebral lesions. Index area of hypermetabolism in T10 vertebral body (SUVmax = 5.7 versus 6.8 on the prior study) . Incidental CT findings: none IMPRESSION: 1. Persistent areas of hypermetabolism throughout the endometrial lining of the uterus, cervix and upper vagina, similar to the prior examination, as above. Importantly, today's study demonstrates increasing haziness and nodularity in the omentum with some associated hypermetabolism, concerning for developing peritoneal spread of disease. There is also slightly increased ascites compared to the prior examination. 2. Widespread metastatic disease to the bones appears similar to the prior study. 3. Stable small amount of asymmetric mural thickening in the hepatic flexure of the colon with focal hypermetabolism, similar to prior examinations, concerning for primary colonic neoplasm. Correlation with nonemergent colonoscopy could be considered if clinically appropriate. 4. New trace right pleural effusion. 5. Additional incidental findings, as above. Electronically Signed   By: Vinnie Langton M.D.   On: 07/14/2018 13:42    All questions were answered. The patient knows to call the clinic with any problems, questions or concerns. No barriers to learning was detected.  I spent 30 minutes counseling the  patient face to face. The total time spent in the appointment was 40 minutes and more than 50% was on counseling and review of test results  Heath Lark, MD 07/18/2018 8:15 AM

## 2018-07-18 NOTE — Assessment & Plan Note (Signed)
She tolerated pain medication poorly due to worsening nausea/constipation She is only taking acetaminophen This is despite trying multiple different agents including oxycodone, morphine or Dilaudid

## 2018-07-18 NOTE — Assessment & Plan Note (Signed)
We discussed prognosis I estimated she has less than 6 months Even with treatment, at most, she would have 6 to 9 months of longevity I recommend her to stop working and focus on quality of life and time with family The patient is undecided I will call her at the end of the week for further follow-up

## 2018-07-21 ENCOUNTER — Encounter: Payer: Self-pay | Admitting: Oncology

## 2018-07-21 ENCOUNTER — Telehealth: Payer: Self-pay | Admitting: *Deleted

## 2018-07-21 ENCOUNTER — Telehealth: Payer: Self-pay | Admitting: Hematology and Oncology

## 2018-07-21 NOTE — Telephone Encounter (Signed)
[facility] was not able to see patient- her insurance does not participate. Referral made to [facility].

## 2018-07-21 NOTE — Progress Notes (Signed)
Faxed disability paperwork to [name] at [phone].

## 2018-07-21 NOTE — Telephone Encounter (Signed)
Referral called into [facility]. They will contact patient for intake.

## 2018-07-21 NOTE — Telephone Encounter (Signed)
-----   Message from Heath Lark, MD sent at 07/21/2018  7:53 AM EDT ----- Regarding: palliative care Pls call [facility] for palliative care at home

## 2018-07-21 NOTE — Telephone Encounter (Signed)
I spoke with the patient over the telephone The patient is not interested to pursue further chemotherapy, given rapid progression of disease on multiple lines of chemotherapy I recommend home-based palliative care/hospice and she agreed I will send referral

## 2018-08-01 ENCOUNTER — Telehealth: Payer: Self-pay | Admitting: Oncology

## 2018-08-02 ENCOUNTER — Other Ambulatory Visit: Payer: Self-pay

## 2018-08-02 ENCOUNTER — Other Ambulatory Visit: Payer: Self-pay | Admitting: Hematology and Oncology

## 2018-08-02 ENCOUNTER — Inpatient Hospital Stay (HOSPITAL_BASED_OUTPATIENT_CLINIC_OR_DEPARTMENT_OTHER): Payer: BC Managed Care – PPO | Admitting: Hematology and Oncology

## 2018-08-02 ENCOUNTER — Telehealth: Payer: Self-pay | Admitting: Oncology

## 2018-08-02 DIAGNOSIS — G893 Neoplasm related pain (acute) (chronic): Secondary | ICD-10-CM | POA: Diagnosis not present

## 2018-08-02 DIAGNOSIS — R64 Cachexia: Secondary | ICD-10-CM | POA: Diagnosis not present

## 2018-08-02 DIAGNOSIS — C530 Malignant neoplasm of endocervix: Secondary | ICD-10-CM

## 2018-08-02 DIAGNOSIS — C7951 Secondary malignant neoplasm of bone: Secondary | ICD-10-CM

## 2018-08-02 DIAGNOSIS — Z7189 Other specified counseling: Secondary | ICD-10-CM

## 2018-08-02 DIAGNOSIS — R634 Abnormal weight loss: Secondary | ICD-10-CM

## 2018-08-02 DIAGNOSIS — C539 Malignant neoplasm of cervix uteri, unspecified: Secondary | ICD-10-CM

## 2018-08-02 MED ORDER — HYDROMORPHONE HCL 4 MG PO TABS: 4.0000 mg | ORAL_TABLET | ORAL | 0 refills | Status: AC | PRN

## 2018-08-02 MED ORDER — ONDANSETRON HCL 8 MG PO TABS: 8.0000 mg | ORAL_TABLET | Freq: Three times a day (TID) | ORAL | 1 refills | Status: AC | PRN

## 2018-08-02 NOTE — Telephone Encounter (Signed)
Called Toni Peterson back and scheduled appointment for today at 11:30.

## 2018-08-02 NOTE — Telephone Encounter (Signed)
Toni Peterson called and needs a refill of Zofran and hydromorphone (last filled 07/05/18). She would like them sent to [facility] on [address].    She would also like to discuss chemotherapy with Dr. Alvy Bimler.

## 2018-08-02 NOTE — Telephone Encounter (Signed)
1130 today or tomorrow at 8 am, 30 mins I will send refill

## 2018-08-02 NOTE — Progress Notes (Signed)
DISCONTINUE OFF PATHWAY REGIMEN - Other Dx   [id number]:Carboplatin + Paclitaxel (5/175) q21 Days:   A cycle is every 21 days:     Paclitaxel      Carboplatin   **Always confirm dose/schedule in your pharmacy ordering system**  REASON: Disease Progression PRIOR TREATMENT: Carboplatin + Paclitaxel (5/175) q21 Days TREATMENT RESPONSE: Progressive Disease (PD)  START OFF PATHWAY REGIMEN - Other   [id number]:Gemcitabine 1,000 mg/m2 Days 1, 8, 15 q28 Days:   A cycle is every 28 days (3 weeks on and 1 week off):     Gemcitabine   **Always confirm dose/schedule in your pharmacy ordering system**  Patient Characteristics: Intent of Therapy: Non-Curative / Palliative Intent, Discussed with Patient

## 2018-08-03 ENCOUNTER — Encounter: Payer: Self-pay | Admitting: Hematology and Oncology

## 2018-08-03 ENCOUNTER — Other Ambulatory Visit: Payer: Self-pay

## 2018-08-03 NOTE — Assessment & Plan Note (Signed)
We discussed prognosis I estimated she has less than 6 months Even with treatment, at most, she would have 6 to 9 months of longevity I recommend her to stop working and focus on quality of life and time with family The patient would like to pursue further chemotherapy and we will proceed She understood treatment goal is palliative in nature

## 2018-08-03 NOTE — Assessment & Plan Note (Signed)
Her pain is better controlled with Dilaudid I refill her prescription recently

## 2018-08-03 NOTE — Assessment & Plan Note (Signed)
She has severe malignant cachexia and continues to lose weight Dexamethasone is improving her appetite For now, she will continue dexamethasone I plan to wean her off dexamethasone after her treatment started

## 2018-08-03 NOTE — Progress Notes (Signed)
[facility] OFFICE PROGRESS NOTE  Patient Care Team: Antony Contras, MD as PCP - General (Family Medicine) Minus Breeding, MD as PCP - Cardiology (Cardiology) Awanda Mink Craige Cotta, RN as Oncology Nurse Navigator (Oncology)  ASSESSMENT & PLAN:  Cervical cancer [facility]) Since the last time I saw her, the patient has made informed decision to pursue further palliative chemotherapy We discussed treatment options, including some of the expected risks and benefits of topotecan, gemcitabine or vinorelbine After much discussions, the patient has made informed decision to proceed with single agent gemcitabine The patient understood that the goals of treatment is strictly palliative in nature  Cancer associated pain Her pain is better controlled with Dilaudid I refill her prescription recently  Malignant cachexia [facility]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   INTERVAL HISTORY: Please see below for problem oriented charting. She returns for further follow-up She is interested to pursue further treatment She continues to lose weight but the dexamethasone is improving her appetite She denies severe nausea or constipation The Dilaudid appears to be helping with her pain.  SUMMARY OF ONCOLOGIC HISTORY: Oncology History Overview Note  MS[facility] - Stable on cervix biopsy PD-L1  2%  Bone biopsy is consistent with metastatic GYN primary MMR protein MLH1 and PMS2 abnormalities noted on colon biopsy Genetics are neg  Progressed on cisplatin, carboplatin and Taxol   Cervical cancer ([place])  09/08/2017 Initial Diagnosis   Patient has a history of symptomatic endometrial fibroids in 2013 for which she underwent hysteroscopic resection with benign pathology.  She has had lifelong normal Pap smears including in 2019.  Of note HPV testing was not performed in this Pap in 2019.  She developed symptoms of very light vaginal spotting in August 2019    11/22/2017 Procedure   She was seen by her gynecologist, Dr. Radene Knee, who performed a pelvic examination.  On physical examination and endocervical mass was appreciated and felt to be likely to be a polyp or fibroid was biopsied on 11/22/17.      11/22/2017 Pathology Results   This revealed poorly differentiated invasive carcinoma.  Immunostains revealed that it was CK7 positive, P 16+, p53 positive, PAX 8+ and CK 5 6 weakly positive.  CK 20, ER PR and p53 immunostains were negative.  The morphology and Immuno profile favored a gynecologic primary with endocervical and endometrial adenocarcinoma in the differential diagnosis.    11/22/2017 Imaging   An ultrasound scan on November 22, 2017 revealed several intramural fibroids.  With saline infusion there was a 1.8 cm density within the endometrial cavity which could be a fibroid or polyp.    12/08/2017 Imaging   PET:  1. Marked hypermetabolism involving the cervix and uterus. Difficult to be certain of the origin but favor cervix. No evidence of parametrial or parauterine tumor and no pelvic or retroperitoneal hypermetabolic lymphadenopathy. 2. L5 vertebral body lesion worrisome for metastasis.  3. Hypermetabolic area involving the hepatic flexure region of the colon is worrisome for colon cancer. Recommend colonoscopy. 4. Benign left adrenal gland adenoma. 5. Symmetric  hypermetabolism in the tongue base and tonsillar regions is likely inflamed lymphoid tissue. No neck mass or adenopathy.   12/19/2017 Imaging   1. Diffuse marrow replacement in the L5 vertebral body, indeterminate though metastatic disease is a concern. Correlate with upcoming biopsy. No extraosseous tumor or pathologic fracture. 2. Marrow signal abnormality on both sides of the T11-12 disc space favored to be degenerative. 3. Severe L4-5 facet arthrosis with grade 1 anterolisthesis, mild bilateral lateral recess stenosis, and mild-to-moderate bilateral neural foraminal stenosis. Findings may worsen with standing. 4. Moderate bilateral neural foraminal stenosis at L5-S1. 5. Partially visualized uterine masses which may reflect a combination of known adenocarcinoma and fibroids. 4 cm right adnexal mass may reflect an exophytic subserosal fibroid or ovarian neoplasm.   12/20/2017 Echocardiogram   LV EF: 60% -   65%   12/23/2017 Procedure   Successful placement of a right IJ approach Power Port with ultrasound and fluoroscopic guidance. The catheter is ready for use.   12/23/2017 Pathology Results   Bone, biopsy, L5 - METASTATIC POORLY DIFFERENTIATED CARCINOMA. - SEE COMMENT. Microscopic Comment Dr. Vic Ripper has reviewed the case and concurs with this interpretation. The case was discussed wtih Joylene John on 12/22/2017. Per request, a block will be sent for PDL1 testing and the results reported separately   01/11/2018 Cancer Staging   Staging form: Cervix Uteri, AJCC 8th Edition - Clinical: FIGO Stage IVB (cT2, cN0, pM1) - Signed by Heath Lark, MD on 01/11/2018   01/16/2018 - 03/27/2018 Radiation Therapy   Radiation treatment dates:   1. 01/16/18-02/21/18 (external beam)                                                   2. 02/28/18, 03/07/18, 03/16/18, 03/20/18, 03/27/18 (brachytherapy treatments)  Site/dose:   1. Pelvis; 25 fractions of 1.8 Gy for a total of 45 Gy (including solitary bone  metastasis at L5)                      2. Cervix; 5 fractions of 5.5 Gy for a total of 27.5 Gy  Beams/energy:   1. Photon 3D; 15X                              2. HDR Ir-192 brachytherapy, tandem/ring system for treatment   01/17/2018 - 02/22/2018 Chemotherapy   The patient had weekly cisplatin   05/17/2018 PET scan   1. Response to therapy of cervical primary. Extensive pelvic edema and low-level hypermetabolism, likely radiation induced. New endocervical and endometrial air may relate to necrosis from radiation therapy. Endometritis cannot be excluded. 2. Given limitations of diffuse pelvic hypermetabolism, no hypermetabolic pelvic nodes identified. No evidence of distant soft tissue metastasis. 3. Progressive osseous metastasis. 4. Small volume abdominopelvic ascites with diffuse peritoneal/omental thickening, nonspecific. Recommend attention on follow-up to exclude early omental/peritoneal metastasis. 5. Coronary artery atherosclerosis. Aortic Atherosclerosis (ICD10-I70.0).   05/24/2018 - 06/14/2018 Chemotherapy   The patient had carboplatin and taxol x 2 cycles for chemotherapy treatment.    07/14/2018 PET scan   1. Persistent areas of hypermetabolism throughout the endometrial lining of the uterus, cervix  and upper vagina, similar to the prior examination, as above. Importantly, today's study demonstrates increasing haziness and nodularity in the omentum with some associated hypermetabolism, concerning for developing peritoneal spread of disease. There is also slightly increased ascites compared to the prior examination. 2. Widespread metastatic disease to the bones appears similar to the prior study. 3. Stable small amount of asymmetric mural thickening in the hepatic flexure of the colon with focal hypermetabolism, similar to prior examinations, concerning for primary colonic neoplasm. Correlation with nonemergent colonoscopy could be considered if clinically appropriate. 4. New trace right  pleural effusion. 5. Additional incidental findings, as above.   08/09/2018 -  Chemotherapy   The patient had gemcitabine (GEMZAR) 1,672 mg in sodium chloride 0.9 % 100 mL chemo infusion, 1,000 mg/m2 = 1,672 mg, Intravenous,  Once, 0 of 4 cycles  for chemotherapy treatment.    Metastasis to bone ([place])  12/28/2017 Initial Diagnosis   Metastasis to bone ([place])   05/24/2018 - 07/04/2018 Chemotherapy   The patient had palonosetron (ALOXI) injection 0.25 mg, 0.25 mg, Intravenous,  Once, 2 of 6 cycles Administration: 0.25 mg (05/24/2018), 0.25 mg (06/14/2018) CARBOplatin (PARAPLATIN) 470 mg in sodium chloride 0.9 % 250 mL chemo infusion, 470 mg, Intravenous,  Once, 2 of 6 cycles Administration: 470 mg (05/24/2018), 370 mg (06/14/2018) PACLitaxel (TAXOL) 186 mg in sodium chloride 0.9 % 250 mL chemo infusion (> '80mg'$ /m2), 559.74 mg/m2 = 186 mg (75 % of original dose 135 mg/m2), Intravenous,  Once, 2 of 6 cycles Dose modification: 101.25 mg/m2 (75 % of original dose 135 mg/m2, Cycle 1, Reason: Provider Judgment) Administration: 186 mg (05/24/2018), 186 mg (06/14/2018) fosaprepitant (EMEND) 150 mg, dexamethasone (DECADRON) 12 mg in sodium chloride 0.9 % 145 mL IVPB, , Intravenous,  Once, 2 of 6 cycles Administration:  (05/24/2018),  (06/14/2018)  for chemotherapy treatment.    08/09/2018 -  Chemotherapy   The patient had gemcitabine (GEMZAR) 1,672 mg in sodium chloride 0.9 % 100 mL chemo infusion, 1,000 mg/m2 = 1,672 mg, Intravenous,  Once, 0 of 4 cycles  for chemotherapy treatment.    Colon cancer ([place])  01/11/2018 Procedure   - Hemorrhoids found on perianal exam. - Malignant partially obstructing tumor at the hepatic flexure. Biopsied. Tattooed. - Two 2 to 5 mm polyps in the sigmoid colon, removed with a cold snare. Resected and retrieved. - Diverticulosis in the sigmoid colon, in the distal descending colon and in the ascending colon. - Non-bleeding internal hemorrhoids. - The examined portion of the ileum was  normal.   01/11/2018 Pathology Results   1. Colon, biopsy, hepatic flexure - ADENOCARCINOMA, SEE COMMENT. 2. Colon, polyp(s), sigmoid, polyp (2) - HYPERPLASTIC POLYP (X2 FRAGMENTS). - NO DYSPLASIA OR MALIGNANCY. Microscopic Comment 1. There are additional fragments of tubular adenoma suggesting this is a primary colon adenocarcinoma.    02/07/2018 Cancer Staging   Staging form: Colon and Rectum, AJCC 8th Edition - Clinical: Stage I (cT1, cN0, cM0) - Signed by Heath Lark, MD on 02/07/2018    Genetic Testing   Negative genetic testing on the CancerNext panel.  The CancerNext gene panel offered by [employer] includes sequencing and rearrangement analysis for the following 34 genes:   APC, ATM, BARD1, BMPR1A, BRCA1, BRCA2, BRIP1, CDH1, CDK4, CDKN2A, CHEK2, DICER1, HOXB13, EPCAM, GREM1, MLH1, MRE11A, MSH2, MSH6, MUTYH, NBN, NF1, PALB2, PMS2, POLD1, POLE, PTEN, RAD50, RAD51C, RAD51D, SMAD4, SMARCA4, STK11, and TP53.  The report date is April 05, 2018.     REVIEW OF SYSTEMS:   Constitutional:  Denies fevers, chills or abnormal weight loss Eyes: Denies blurriness of vision Ears, nose, mouth, throat, and face: Denies mucositis or sore throat Respiratory: Denies cough, dyspnea or wheezes Cardiovascular: Denies palpitation, chest discomfort or lower extremity swelling Gastrointestinal:  Denies nausea, heartburn or change in bowel habits Skin: Denies abnormal skin rashes Lymphatics: Denies new lymphadenopathy or easy bruising Neurological:Denies numbness, tingling or new weaknesses Behavioral/Psych: Mood is stable, no new changes  All other systems were reviewed with the patient and are negative.  I have reviewed the past medical history, past surgical history, social history and family history with the patient and they are unchanged from previous note.  ALLERGIES:  has No Known Allergies.  MEDICATIONS:  Current Outpatient Medications  Medication Sig Dispense Refill  . amLODipine  (NORVASC) 10 MG tablet Take 10 mg by mouth every evening.     Marland Kitchen atenolol (TENORMIN) 50 MG tablet Take 50 mg by mouth every evening.   1  . Cholecalciferol (VITAMIN D3) 5000 units CAPS Take 5,000 Units by mouth daily.     Marland Kitchen dexamethasone (DECADRON) 4 MG tablet Take 1 tablet (4 mg total) by mouth 2 (two) times daily. 60 tablet 1  . fish oil-omega-3 fatty acids 1000 MG capsule Take 1 g by mouth daily.     . hydrocortisone (ANUSOL-HC) 2.5 % rectal cream Apply 1 application topically 2 (two) times daily. 28.35 g 0  . HYDROmorphone (DILAUDID) 4 MG tablet Take 1 tablet (4 mg total) by mouth every 4 (four) hours as needed for severe pain. 60 tablet 0  . lidocaine-prilocaine (EMLA) cream Apply to affected area once 30 g 3  . lidocaine-prilocaine (EMLA) cream Apply to affected area once 30 g 3  . mirtazapine (REMERON) 30 MG tablet TAKE 1 TABLET (30 MG TOTAL) BY MOUTH AT BEDTIME. 90 tablet 4  . Multiple Vitamin (MULTIVITAMIN WITH MINERALS) TABS tablet Take 1 tablet by mouth daily.    . ondansetron (ZOFRAN) 8 MG tablet Take 1 tablet (8 mg total) by mouth every 8 (eight) hours as needed. 90 tablet 1  . pramoxine (PROCTOFOAM) 1 % foam Place 1 application rectally 3 (three) times daily as needed for anal itching. 15 g 2  . prochlorperazine (COMPAZINE) 10 MG tablet Take 1 tablet (10 mg total) by mouth every 6 (six) hours as needed (Nausea or vomiting). 30 tablet 1   No current facility-administered medications for this visit.    Facility-Administered Medications Ordered in Other Visits  Medication Dose Route Frequency Provider Last Rate Last Dose  . sodium chloride flush (NS) 0.9 % injection 10 mL  10 mL Intravenous PRN Alvy Bimler, Jynesis Nakamura, MD   10 mL at 06/23/18 1112    PHYSICAL EXAMINATION: ECOG PERFORMANCE STATUS: 1 - Symptomatic but completely ambulatory  Vitals:   08/02/18 1129  BP: 140/72  Pulse: 67  Resp: 17  Temp: 97.7 F (36.5 C)  SpO2: 99%   Filed Weights   08/02/18 1129  Weight: 146 lb (66.2  kg)    GENERAL:alert, no distress and comfortable Musculoskeletal:no cyanosis of digits and no clubbing  NEURO: alert & oriented x 3 with fluent speech, no focal motor/sensory deficits  LABORATORY DATA:  I have reviewed the data as listed    Component Value Date/Time   NA 132 (L) 07/05/2018 1044   K 4.5 07/05/2018 1044   CL 96 (L) 07/05/2018 1044   CO2 25 07/05/2018 1044   GLUCOSE 174 (H) 07/05/2018 1044   BUN 20 07/05/2018 1044   CREATININE 0.97  07/05/2018 1044   CREATININE 0.81 05/08/2018 1135   CALCIUM 9.7 07/05/2018 1044   PROT 7.4 07/05/2018 1044   ALBUMIN 2.8 (L) 07/05/2018 1044   AST 19 07/05/2018 1044   AST 12 (L) 05/08/2018 1135   ALT 21 07/05/2018 1044   ALT 12 05/08/2018 1135   ALKPHOS 107 07/05/2018 1044   BILITOT 0.2 (L) 07/05/2018 1044   BILITOT 0.4 05/08/2018 1135   GFRNONAA >60 07/05/2018 1044   GFRNONAA >60 05/08/2018 1135   GFRAA >60 07/05/2018 1044   GFRAA >60 05/08/2018 1135    No results found for: SPEP, UPEP  Lab Results  Component Value Date   WBC 16.4 (H) 07/05/2018   NEUTROABS 15.2 (H) 07/05/2018   HGB 9.5 (L) 07/05/2018   HCT 29.8 (L) 07/05/2018   MCV 85.6 07/05/2018   PLT 401 (H) 07/05/2018      Chemistry      Component Value Date/Time   NA 132 (L) 07/05/2018 1044   K 4.5 07/05/2018 1044   CL 96 (L) 07/05/2018 1044   CO2 25 07/05/2018 1044   BUN 20 07/05/2018 1044   CREATININE 0.97 07/05/2018 1044   CREATININE 0.81 05/08/2018 1135      Component Value Date/Time   CALCIUM 9.7 07/05/2018 1044   ALKPHOS 107 07/05/2018 1044   AST 19 07/05/2018 1044   AST 12 (L) 05/08/2018 1135   ALT 21 07/05/2018 1044   ALT 12 05/08/2018 1135   BILITOT 0.2 (L) 07/05/2018 1044   BILITOT 0.4 05/08/2018 1135       RADIOGRAPHIC STUDIES: I have personally reviewed the radiological images as listed and agreed with the findings in the report. Nm Pet Image Restag (ps) Skull Base To Thigh  Result Date: 07/14/2018 CLINICAL DATA:  Subsequent  treatment strategy for cervical carcinoma. EXAM: NUCLEAR MEDICINE PET SKULL BASE TO THIGH TECHNIQUE: 7.5 mCi F-18 FDG was injected intravenously. Full-ring PET imaging was performed from the skull base to thigh after the radiotracer. CT data was obtained and used for attenuation correction and anatomic localization. Fasting blood glucose: 94 mg/dl COMPARISON:  PET/CT 05/17/2018. FINDINGS: Mediastinal blood pool activity: SUV max 2.7 Liver activity: SUV max 3.4 NECK: No hypermetabolic lymph nodes in the neck. Incidental CT findings: None. CHEST: No hypermetabolic mediastinal or hilar nodes. No suspicious pulmonary nodules on the CT scan. Incidental CT findings: Heart size is mildly enlarged. Atherosclerotic calcifications in the thoracic aorta as well as the left anterior descending coronary artery. Trace right pleural effusion lying dependently. Right internal jugular single-lumen porta cath with tip terminating in the superior cavoatrial junction. ABDOMEN/PELVIS: Hypermetabolism is again noted throughout the uterus, cervix and upper vagina, similar to the prior examination. Uterine hypermetabolism is most pronounced in the endometrial canal (SUVmax = 7.2 versus 6.0 on the prior examination). Cervical hypermetabolism is contiguous with hypermetabolism along the posterior wall of the upper vagina and is slightly more pronounced than the prior examination (SUVmax = 8.0 versus 6.1 on the prior). New increasing nodularity and haziness in the omentum with corresponding areas of hypermetabolism which have clearly increased compared to the prior examination (SUVmax = 5.1-6.1), concerning for developing peritoneal spread of disease. Asymmetric mural thickening and hypermetabolism (SUVmax = 12.4 versus 15.1 on the prior examination) in the hepatic flexure of the colon again noted. Incidental CT findings: Small amount of gas in the endometrial canal. Poor definition of soft tissue planes in the low anatomic pelvis, similar to  prior studies, presumably reflective of post radiation changes. Asymmetric soft  tissue thickening in the hepatic flexure of the colon best appreciated on axial image 107 of series 4, similar to prior examination. Increasing haziness and subtle nodularity in the omentum compared to the prior examination with corresponding hypermetabolism (discussed above), concerning for developing peritoneal spread of disease. Small volume of ascites and mild diffuse haziness in the small bowel mesentery. SKELETON: Numerous predominantly sclerotic osseous metastases are again noted throughout the visualized axial and appendicular skeleton, including a lesion in the proximal right humerus (SUVmax = 5.7 versus 6.4 on prior) and several vertebral lesions. Index area of hypermetabolism in T10 vertebral body (SUVmax = 5.7 versus 6.8 on the prior study) . Incidental CT findings: none IMPRESSION: 1. Persistent areas of hypermetabolism throughout the endometrial lining of the uterus, cervix and upper vagina, similar to the prior examination, as above. Importantly, today's study demonstrates increasing haziness and nodularity in the omentum with some associated hypermetabolism, concerning for developing peritoneal spread of disease. There is also slightly increased ascites compared to the prior examination. 2. Widespread metastatic disease to the bones appears similar to the prior study. 3. Stable small amount of asymmetric mural thickening in the hepatic flexure of the colon with focal hypermetabolism, similar to prior examinations, concerning for primary colonic neoplasm. Correlation with nonemergent colonoscopy could be considered if clinically appropriate. 4. New trace right pleural effusion. 5. Additional incidental findings, as above. Electronically Signed   By: Vinnie Langton M.D.   On: 07/14/2018 13:42    All questions were answered. The patient knows to call the clinic with any problems, questions or concerns. No barriers to  learning was detected.  I spent 30 minutes counseling the patient face to face. The total time spent in the appointment was 40 minutes and more than 50% was on counseling and review of test results  Heath Lark, MD 08/03/2018 8:11 AM

## 2018-08-03 NOTE — Assessment & Plan Note (Signed)
Since the last time I saw her, the patient has made informed decision to pursue further palliative chemotherapy We discussed treatment options, including some of the expected risks and benefits of topotecan, gemcitabine or vinorelbine After much discussions, the patient has made informed decision to proceed with single agent gemcitabine The patient understood that the goals of treatment is strictly palliative in nature

## 2018-08-04 ENCOUNTER — Telehealth: Payer: Self-pay | Admitting: Hematology and Oncology

## 2018-08-04 NOTE — Telephone Encounter (Signed)
I talk with patient regarding schedule  

## 2018-08-09 ENCOUNTER — Inpatient Hospital Stay: Payer: BC Managed Care – PPO

## 2018-08-09 ENCOUNTER — Other Ambulatory Visit: Payer: Self-pay

## 2018-08-09 ENCOUNTER — Inpatient Hospital Stay: Payer: BC Managed Care – PPO | Attending: Hematology and Oncology

## 2018-08-09 VITALS — BP 108/90 | HR 66 | Temp 98.5°F | Resp 12

## 2018-08-09 DIAGNOSIS — Z5111 Encounter for antineoplastic chemotherapy: Secondary | ICD-10-CM | POA: Diagnosis present

## 2018-08-09 DIAGNOSIS — C530 Malignant neoplasm of endocervix: Secondary | ICD-10-CM

## 2018-08-09 DIAGNOSIS — Z7189 Other specified counseling: Secondary | ICD-10-CM

## 2018-08-09 DIAGNOSIS — C7951 Secondary malignant neoplasm of bone: Secondary | ICD-10-CM

## 2018-08-09 DIAGNOSIS — D6481 Anemia due to antineoplastic chemotherapy: Secondary | ICD-10-CM

## 2018-08-09 DIAGNOSIS — C539 Malignant neoplasm of cervix uteri, unspecified: Secondary | ICD-10-CM | POA: Diagnosis present

## 2018-08-09 LAB — CBC WITH DIFFERENTIAL/PLATELET
Abs Immature Granulocytes: 0.21 10*3/uL — ABNORMAL HIGH (ref 0.00–0.07)
Basophils Absolute: 0 10*3/uL (ref 0.0–0.1)
Basophils Relative: 0 %
Eosinophils Absolute: 0 10*3/uL (ref 0.0–0.5)
Eosinophils Relative: 0 %
HCT: 27.5 % — ABNORMAL LOW (ref 36.0–46.0)
Hemoglobin: 9 g/dL — ABNORMAL LOW (ref 12.0–15.0)
Immature Granulocytes: 2 %
Lymphocytes Relative: 2 %
Lymphs Abs: 0.3 10*3/uL — ABNORMAL LOW (ref 0.7–4.0)
MCH: 28 pg (ref 26.0–34.0)
MCHC: 32.7 g/dL (ref 30.0–36.0)
MCV: 85.7 fL (ref 80.0–100.0)
Monocytes Absolute: 0.3 10*3/uL (ref 0.1–1.0)
Monocytes Relative: 2 %
Neutro Abs: 13.4 10*3/uL — ABNORMAL HIGH (ref 1.7–7.7)
Neutrophils Relative %: 94 %
Platelets: 363 10*3/uL (ref 150–400)
RBC: 3.21 MIL/uL — ABNORMAL LOW (ref 3.87–5.11)
RDW: 21.6 % — ABNORMAL HIGH (ref 11.5–15.5)
WBC: 14.2 10*3/uL — ABNORMAL HIGH (ref 4.0–10.5)
nRBC: 0 % (ref 0.0–0.2)

## 2018-08-09 LAB — COMPREHENSIVE METABOLIC PANEL
ALT: 18 U/L (ref 0–44)
AST: 10 U/L — ABNORMAL LOW (ref 15–41)
Albumin: 2 g/dL — ABNORMAL LOW (ref 3.5–5.0)
Alkaline Phosphatase: 170 U/L — ABNORMAL HIGH (ref 38–126)
Anion gap: 14 (ref 5–15)
BUN: 44 mg/dL — ABNORMAL HIGH (ref 8–23)
CO2: 19 mmol/L — ABNORMAL LOW (ref 22–32)
Calcium: 9.3 mg/dL (ref 8.9–10.3)
Chloride: 97 mmol/L — ABNORMAL LOW (ref 98–111)
Creatinine, Ser: 1.39 mg/dL — ABNORMAL HIGH (ref 0.44–1.00)
GFR calc Af Amer: 46 mL/min — ABNORMAL LOW (ref >60)
GFR calc non Af Amer: 39 mL/min — ABNORMAL LOW (ref >60)
Glucose, Bld: 125 mg/dL — ABNORMAL HIGH (ref 70–99)
Potassium: 5.3 mmol/L — ABNORMAL HIGH (ref 3.5–5.1)
Sodium: 130 mmol/L — ABNORMAL LOW (ref 135–145)
Total Bilirubin: 0.3 mg/dL (ref 0.3–1.2)
Total Protein: 6.6 g/dL (ref 6.5–8.1)

## 2018-08-09 LAB — SAMPLE TO BLOOD BANK

## 2018-08-09 LAB — MAGNESIUM: Magnesium: 2 mg/dL (ref 1.7–2.4)

## 2018-08-09 MED ORDER — PROCHLORPERAZINE MALEATE 10 MG PO TABS
10.0000 mg | ORAL_TABLET | Freq: Once | ORAL | Status: AC
  Administered 2018-08-09: 10 mg via ORAL

## 2018-08-09 MED ORDER — SODIUM CHLORIDE 0.9 % IV SOLN
1000.0000 mg/m2 | Freq: Once | INTRAVENOUS | Status: AC
  Administered 2018-08-09: 1672 mg via INTRAVENOUS
  Filled 2018-08-09: qty 43.97

## 2018-08-09 MED ORDER — SODIUM CHLORIDE 0.9% FLUSH
10.0000 mL | INTRAVENOUS | Status: DC | PRN
  Administered 2018-08-09: 10 mL
  Filled 2018-08-09: qty 10

## 2018-08-09 MED ORDER — SODIUM CHLORIDE 0.9 % IV SOLN
Freq: Once | INTRAVENOUS | Status: AC
  Administered 2018-08-09: 10:00:00 via INTRAVENOUS
  Filled 2018-08-09: qty 250

## 2018-08-09 MED ORDER — HEPARIN SOD (PORK) LOCK FLUSH 100 UNIT/ML IV SOLN
500.0000 [IU] | Freq: Once | INTRAVENOUS | Status: AC | PRN
  Administered 2018-08-09: 500 [IU]
  Filled 2018-08-09: qty 5

## 2018-08-09 MED ORDER — PROCHLORPERAZINE MALEATE 10 MG PO TABS
ORAL_TABLET | ORAL | Status: AC
  Filled 2018-08-09: qty 1

## 2018-08-09 NOTE — Patient Instructions (Signed)
[facility] Discharge Instructions for Patients Receiving Chemotherapy  Today you received the following chemotherapy agents: Gemzar.  To help prevent nausea and vomiting after your treatment, we encourage you to take your nausea medication as prescribed.   If you develop nausea and vomiting that is not controlled by your nausea medication, call the clinic.   BELOW ARE SYMPTOMS THAT SHOULD BE REPORTED IMMEDIATELY:  *FEVER GREATER THAN 100.5 F  *CHILLS WITH OR WITHOUT FEVER  NAUSEA AND VOMITING THAT IS NOT CONTROLLED WITH YOUR NAUSEA MEDICATION  *UNUSUAL SHORTNESS OF BREATH  *UNUSUAL BRUISING OR BLEEDING  TENDERNESS IN MOUTH AND THROAT WITH OR WITHOUT PRESENCE OF ULCERS  *URINARY PROBLEMS  *BOWEL PROBLEMS  UNUSUAL RASH Items with * indicate a potential emergency and should be followed up as soon as possible.  Feel free to call the clinic should you have any questions or concerns. The clinic phone number is [phone].  Please show the [facility] at check-in to the Emergency Department and triage nurse.  Gemcitabine injection (Gemzar) What is this medicine? GEMCITABINE (jem SYE ta been) is a chemotherapy drug. This medicine is used to treat many types of cancer like breast cancer, lung cancer, pancreatic cancer, and ovarian cancer. This medicine may be used for other purposes; ask your health care provider or pharmacist if you have questions. COMMON BRAND NAME(S): Gemzar, Infugem What should I tell my health care provider before I take this medicine? They need to know if you have any of these conditions:  blood disorders  infection  kidney disease  liver disease  lung or breathing disease, like asthma  recent or ongoing radiation therapy  an unusual or allergic reaction to gemcitabine, other chemotherapy, other medicines, foods, dyes, or preservatives  pregnant or trying to get pregnant  breast-feeding How should I use this  medicine? This drug is given as an infusion into a vein. It is administered in a hospital or clinic by a specially trained health care professional. Talk to your pediatrician regarding the use of this medicine in children. Special care may be needed. Overdosage: If you think you have taken too much of this medicine contact a poison control center or emergency room at once. NOTE: This medicine is only for you. Do not share this medicine with others. What if I miss a dose? It is important not to miss your dose. Call your doctor or health care professional if you are unable to keep an appointment. What may interact with this medicine?  medicines to increase blood counts like filgrastim, pegfilgrastim, sargramostim  some other chemotherapy drugs like cisplatin  vaccines Talk to your doctor or health care professional before taking any of these medicines:  acetaminophen  aspirin  ibuprofen  ketoprofen  naproxen This list may not describe all possible interactions. Give your health care provider a list of all the medicines, herbs, non-prescription drugs, or dietary supplements you use. Also tell them if you smoke, drink alcohol, or use illegal drugs. Some items may interact with your medicine. What should I watch for while using this medicine? Visit your doctor for checks on your progress. This drug may make you feel generally unwell. This is not uncommon, as chemotherapy can affect healthy cells as well as cancer cells. Report any side effects. Continue your course of treatment even though you feel ill unless your doctor tells you to stop. In some cases, you may be given additional medicines to help with side effects. Follow all directions for their use.  Call your doctor or health care professional for advice if you get a fever, chills or sore throat, or other symptoms of a cold or flu. Do not treat yourself. This drug decreases your body's ability to fight infections. Try to avoid being  around people who are sick. This medicine may increase your risk to bruise or bleed. Call your doctor or health care professional if you notice any unusual bleeding. Be careful brushing and flossing your teeth or using a toothpick because you may get an infection or bleed more easily. If you have any dental work done, tell your dentist you are receiving this medicine. Avoid taking products that contain aspirin, acetaminophen, ibuprofen, naproxen, or ketoprofen unless instructed by your doctor. These medicines may hide a fever. Do not become pregnant while taking this medicine or for 6 months after stopping it. Women should inform their doctor if they wish to become pregnant or think they might be pregnant. Men should not father a child while taking this medicine and for 3 months after stopping it. There is a potential for serious side effects to an unborn child. Talk to your health care professional or pharmacist for more information. Do not breast-feed an infant while taking this medicine or for at least 1 week after stopping it. Men should inform their doctors if they wish to father a child. This medicine may lower sperm counts. Talk with your doctor or health care professional if you are concerned about your fertility. What side effects may I notice from receiving this medicine? Side effects that you should report to your doctor or health care professional as soon as possible:  allergic reactions like skin rash, itching or hives, swelling of the face, lips, or tongue  breathing problems  pain, redness, or irritation at site where injected  signs and symptoms of a dangerous change in heartbeat or heart rhythm like chest pain; dizziness; fast or irregular heartbeat; palpitations; feeling faint or lightheaded, falls; breathing problems  signs of decreased platelets or bleeding - bruising, pinpoint red spots on the skin, black, tarry stools, blood in the urine  signs of decreased red blood cells -  unusually weak or tired, feeling faint or lightheaded, falls  signs of infection - fever or chills, cough, sore throat, pain or difficulty passing urine  signs and symptoms of kidney injury like trouble passing urine or change in the amount of urine  signs and symptoms of liver injury like dark yellow or brown urine; general ill feeling or flu-like symptoms; light-colored stools; loss of appetite; nausea; right upper belly pain; unusually weak or tired; yellowing of the eyes or skin  swelling of ankles, feet, hands Side effects that usually do not require medical attention (report to your doctor or health care professional if they continue or are bothersome):  constipation  diarrhea  hair loss  loss of appetite  nausea  rash  vomiting This list may not describe all possible side effects. Call your doctor for medical advice about side effects. You may report side effects to FDA at 1-800-FDA-1088. Where should I keep my medicine? This drug is given in a hospital or clinic and will not be stored at home. NOTE: This sheet is a summary. It may not cover all possible information. If you have questions about this medicine, talk to your doctor, pharmacist, or health care provider.  2020 Elsevier/Gold Standard (2017-04-20 18:06:11)

## 2018-08-10 ENCOUNTER — Other Ambulatory Visit: Payer: Self-pay

## 2018-08-10 ENCOUNTER — Emergency Department (HOSPITAL_COMMUNITY)
Admission: EM | Admit: 2018-08-10 | Discharge: 2018-08-10 | Disposition: A | Discharge status: Home / Self Care | Payer: BC Managed Care – PPO | Attending: Emergency Medicine | Admitting: Emergency Medicine

## 2018-08-10 ENCOUNTER — Telehealth: Payer: Self-pay

## 2018-08-10 DIAGNOSIS — R42 Dizziness and giddiness: Secondary | ICD-10-CM | POA: Diagnosis present

## 2018-08-10 DIAGNOSIS — Z79899 Other long term (current) drug therapy: Secondary | ICD-10-CM | POA: Insufficient documentation

## 2018-08-10 DIAGNOSIS — E860 Dehydration: Secondary | ICD-10-CM | POA: Diagnosis not present

## 2018-08-10 DIAGNOSIS — Z85038 Personal history of other malignant neoplasm of large intestine: Secondary | ICD-10-CM | POA: Diagnosis not present

## 2018-08-10 DIAGNOSIS — Z87891 Personal history of nicotine dependence: Secondary | ICD-10-CM | POA: Insufficient documentation

## 2018-08-10 DIAGNOSIS — R112 Nausea with vomiting, unspecified: Secondary | ICD-10-CM

## 2018-08-10 DIAGNOSIS — I10 Essential (primary) hypertension: Secondary | ICD-10-CM | POA: Insufficient documentation

## 2018-08-10 DIAGNOSIS — R1013 Epigastric pain: Secondary | ICD-10-CM

## 2018-08-10 LAB — CBC WITH DIFFERENTIAL/PLATELET
Abs Immature Granulocytes: 0.06 10*3/uL (ref 0.00–0.07)
Basophils Absolute: 0 10*3/uL (ref 0.0–0.1)
Basophils Relative: 0 %
Eosinophils Absolute: 0 10*3/uL (ref 0.0–0.5)
Eosinophils Relative: 0 %
HCT: 28.9 % — ABNORMAL LOW (ref 36.0–46.0)
Hemoglobin: 9.3 g/dL — ABNORMAL LOW (ref 12.0–15.0)
Immature Granulocytes: 1 %
Lymphocytes Relative: 1 %
Lymphs Abs: 0.1 10*3/uL — ABNORMAL LOW (ref 0.7–4.0)
MCH: 28.2 pg (ref 26.0–34.0)
MCHC: 32.2 g/dL (ref 30.0–36.0)
MCV: 87.6 fL (ref 80.0–100.0)
Monocytes Absolute: 0 10*3/uL — ABNORMAL LOW (ref 0.1–1.0)
Monocytes Relative: 0 %
Neutro Abs: 12.3 10*3/uL — ABNORMAL HIGH (ref 1.7–7.7)
Neutrophils Relative %: 98 %
Platelets: 272 10*3/uL (ref 150–400)
RBC: 3.3 MIL/uL — ABNORMAL LOW (ref 3.87–5.11)
RDW: 21.8 % — ABNORMAL HIGH (ref 11.5–15.5)
WBC: 12.5 10*3/uL — ABNORMAL HIGH (ref 4.0–10.5)
nRBC: 0 % (ref 0.0–0.2)

## 2018-08-10 LAB — COMPREHENSIVE METABOLIC PANEL
ALT: 16 U/L (ref 0–44)
AST: 11 U/L — ABNORMAL LOW (ref 15–41)
Albumin: 2.2 g/dL — ABNORMAL LOW (ref 3.5–5.0)
Alkaline Phosphatase: 143 U/L — ABNORMAL HIGH (ref 38–126)
Anion gap: 11 (ref 5–15)
BUN: 43 mg/dL — ABNORMAL HIGH (ref 8–23)
CO2: 19 mmol/L — ABNORMAL LOW (ref 22–32)
Calcium: 8.2 mg/dL — ABNORMAL LOW (ref 8.9–10.3)
Chloride: 100 mmol/L (ref 98–111)
Creatinine, Ser: 1.17 mg/dL — ABNORMAL HIGH (ref 0.44–1.00)
GFR calc Af Amer: 56 mL/min — ABNORMAL LOW (ref >60)
GFR calc non Af Amer: 49 mL/min — ABNORMAL LOW (ref >60)
Glucose, Bld: 221 mg/dL — ABNORMAL HIGH (ref 70–99)
Potassium: 4.6 mmol/L (ref 3.5–5.1)
Sodium: 130 mmol/L — ABNORMAL LOW (ref 135–145)
Total Bilirubin: 0.7 mg/dL (ref 0.3–1.2)
Total Protein: 6.2 g/dL — ABNORMAL LOW (ref 6.5–8.1)

## 2018-08-10 LAB — CBG MONITORING, ED: Glucose-Capillary: 210 mg/dL — ABNORMAL HIGH (ref 70–99)

## 2018-08-10 LAB — LACTIC ACID, PLASMA
Lactic Acid, Venous: 1.8 mmol/L (ref 0.5–1.9)
Lactic Acid, Venous: 1.2 mmol/L (ref 0.5–1.9)

## 2018-08-10 LAB — TSH: TSH: 1.194 u[IU]/mL (ref 0.350–4.500)

## 2018-08-10 LAB — LIPASE, BLOOD: Lipase: 20 U/L (ref 11–51)

## 2018-08-10 LAB — TROPONIN I (HIGH SENSITIVITY)
Troponin I (High Sensitivity): 19 ng/L — ABNORMAL HIGH (ref <18)
Troponin I (High Sensitivity): 22 ng/L — ABNORMAL HIGH (ref <18)

## 2018-08-10 MED ORDER — SODIUM CHLORIDE 0.9 % IV BOLUS
1000.0000 mL | Freq: Once | INTRAVENOUS | Status: AC
  Administered 2018-08-10: 14:00:00 1000 mL via INTRAVENOUS

## 2018-08-10 MED ORDER — SODIUM CHLORIDE 0.9 % IV BOLUS
1000.0000 mL | Freq: Once | INTRAVENOUS | Status: AC
  Administered 2018-08-10: 16:00:00 1000 mL via INTRAVENOUS

## 2018-08-10 MED ORDER — HYDROMORPHONE HCL 1 MG/ML IJ SOLN
1.0000 mg | Freq: Once | INTRAMUSCULAR | Status: AC
  Administered 2018-08-10: 1 mg via INTRAVENOUS
  Filled 2018-08-10: qty 1

## 2018-08-10 MED ORDER — ONDANSETRON HCL 4 MG/2ML IJ SOLN
4.0000 mg | Freq: Once | INTRAMUSCULAR | Status: AC
  Administered 2018-08-10: 4 mg via INTRAVENOUS
  Filled 2018-08-10: qty 2

## 2018-08-10 NOTE — ED Provider Notes (Signed)
[facility] DEPT Provider Note   CSN: [id number] Arrival date & time: 08/10/18  1336     History   Chief Complaint Chief Complaint  Patient presents with  . Dizziness  . Fall    HPI Toni Peterson is a 68 y.o. female.     The history is provided by the patient and medical records. No language interpreter was used.  Dizziness Quality:  Lightheadedness Severity:  Moderate Onset quality:  Gradual Duration:  1 week Timing:  Intermittent Progression:  Waxing and waning Chronicity:  New Context: standing up   Relieved by:  Nothing Worsened by:  Nothing Ineffective treatments:  None tried Associated symptoms: nausea and weakness (fatigue)   Associated symptoms: no chest pain, no diarrhea, no headaches, no palpitations, no shortness of breath, no syncope and no vomiting   Fall Associated symptoms include abdominal pain. Pertinent negatives include no chest pain, no headaches and no shortness of breath.    Past Medical History:  Diagnosis Date  . Adenoma of left adrenal gland 2019   Benign left adrenal gland adenoma  . Cervical cancer ([place])   . Colon cancer ([place])   . Diabetes ([place])    no meds-diet control  . Diverticulosis 01/2018   Sigmoid, Noted on colonoscopy  . Family history of breast cancer   . Grade II diastolic dysfunction 01/74/9449   Noted on ECHO  . Hemorrhoids 01/2018  . History of cardiomegaly 2003   noted on CXR  . Hyperlipidemia   . Hypertension   . Hypertrophy of inter-atrial septum 12/20/2017   Noted on ECHO  . Multiple thyroid nodules 05/2017  . Other abnormal glucose   . PONV (postoperative nausea and vomiting)    slow awakening after dental visit, pain after tandem removal 02-28-18  . PONV (postoperative nausea and vomiting) 02/28/2018   pt told would get scopolamine patch for next tandem 03-07-18 for nausea/vomiting    Patient Active Problem List   Diagnosis Date Noted  . 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 Cancer associated pain 05/30/2018  . Insomnia due to medical condition 05/30/2018  . Other constipation 05/18/2018  . Genetic testing 04/11/2018  . UTI (urinary tract infection) 03/24/2018  . Family history of breast cancer   . Hemorrhoids 02/07/2018  . Anemia due to antineoplastic chemotherapy 01/24/2018  . Colon cancer ([place]) 01/13/2018  . Goals of care, counseling/discussion 12/28/2017  . Metastasis to bone ([place]) 12/28/2017  . Lesion of colon 12/28/2017  . Cervical cancer ([place]) 12/02/2017  . Obesity, unspecified 11/10/2012  . Hypertension   . PONV (postoperative nausea and vomiting)   . Hyperlipidemia   . Other abnormal glucose     Past Surgical History:  Procedure Laterality Date  . ADENOIDECTOMY    . COLONOSCOPY  2013   said it was in [place]   . COLONOSCOPY  01/2018  . DILATION AND CURETTAGE OF UTERUS    . DILATION AND CURETTAGE OF UTERUS  03/11/2011   Procedure: DILATATION AND CURETTAGE;  Surgeon: Darlyn Chamber, MD;  Location: [facility];  Service: Gynecology;  Laterality: N/A;  . HYSTEROSCOPY W/D&C  09-02-2006   ENDOMETRIAL BX'S  . HYSTEROSCOPY WITH RESECTOSCOPE  03/11/2011   Procedure: HYSTEROSCOPY WITH RESECTOSCOPE;  Surgeon: Darlyn Chamber, MD;  Location: [facility];  Service: Gynecology;  Laterality: N/A;  . IR FLUORO GUIDED NEEDLE PLC ASPIRATION/INJECTION LOC  12/21/2017  . IR IMAGING GUIDED PORT INSERTION  12/23/2017  . OPERATIVE ULTRASOUND N/A  03/20/2018   Procedure: OPERATIVE ULTRASOUND;  Surgeon: Gery Pray, MD;  Location: [facility];  Service: Gynecology;  Laterality: N/A;  . OPERATIVE ULTRASOUND N/A 03/27/2018   Procedure: OPERATIVE ULTRASOUND;  Surgeon: Gery Pray, MD;  Location: [facility];  Service: Gynecology;  Laterality: N/A;  . TANDEM RING INSERTION N/A 02/28/2018   Procedure: TANDEM RING INSERTION;  Surgeon: Gery Pray, MD;  Location: [facility];  Service:  Urology;  Laterality: N/A;  . TANDEM RING INSERTION N/A 03/07/2018   Procedure: TANDEM RING INSERTION;  Surgeon: Gery Pray, MD;  Location: [facility];  Service: Urology;  Laterality: N/A;  . TANDEM RING INSERTION N/A 03/16/2018   Procedure: TANDEM RING INSERTION FOR HIGH DOSE RATE RADIATION THERAPY WITH ULTRASOUND;  Surgeon: Gery Pray, MD;  Location: [facility];  Service: Gynecology;  Laterality: N/A;  . TANDEM RING INSERTION N/A 03/20/2018   Procedure: TANDEM RING INSERTION;  Surgeon: Gery Pray, MD;  Location: [facility];  Service: Gynecology;  Laterality: N/A;  . TANDEM RING INSERTION N/A 03/27/2018   Procedure: TANDEM RING INSERTION;  Surgeon: Gery Pray, MD;  Location: [facility];  Service: Gynecology;  Laterality: N/A;     OB History   No obstetric history on file.      Home Medications    Prior to Admission medications   Medication Sig Start Date End Date Taking? Authorizing Provider  amLODipine (NORVASC) 10 MG tablet Take 10 mg by mouth every evening.  09/01/17   [provider]  atenolol (TENORMIN) 50 MG tablet Take 50 mg by mouth every evening.  07/25/17   [provider]  Cholecalciferol (VITAMIN D3) 5000 units CAPS Take 5,000 Units by mouth daily.     [provider]  dexamethasone (DECADRON) 4 MG tablet Take 1 tablet (4 mg total) by mouth 2 (two) times daily. 07/13/18   Heath Lark, MD  fish oil-omega-3 fatty acids 1000 MG capsule Take 1 g by mouth daily.     [provider]  hydrocortisone (ANUSOL-HC) 2.5 % rectal cream Apply 1 application topically 2 (two) times daily. 02/14/18   Gery Pray, MD  HYDROmorphone (DILAUDID) 4 MG tablet Take 1 tablet (4 mg total) by mouth every 4 (four) hours as needed for severe pain. 08/02/18   Heath Lark, MD  lidocaine-prilocaine (EMLA) cream Apply to affected area once 12/28/17   Tish Men, MD  lidocaine-prilocaine (EMLA) cream Apply to  affected area once 05/18/18   Heath Lark, MD  mirtazapine (REMERON) 30 MG tablet TAKE 1 TABLET (30 MG TOTAL) BY MOUTH AT BEDTIME. 06/24/18   Heath Lark, MD  Multiple Vitamin (MULTIVITAMIN WITH MINERALS) TABS tablet Take 1 tablet by mouth daily.    [provider]  ondansetron (ZOFRAN) 8 MG tablet Take 1 tablet (8 mg total) by mouth every 8 (eight) hours as needed. 08/02/18   Heath Lark, MD  pramoxine (PROCTOFOAM) 1 % foam Place 1 application rectally 3 (three) times daily as needed for anal itching. 02/09/18   Tanner, Lyndon Code., PA-C  prochlorperazine (COMPAZINE) 10 MG tablet Take 1 tablet (10 mg total) by mouth every 6 (six) hours as needed (Nausea or vomiting). 05/18/18   Heath Lark, MD    Family History Family History  Problem Relation Age of Onset  . Stroke Brother        Died 44  . Heart attack Mother        Vague  . Hypertension Mother   .  Hyperlipidemia Other   . Hypertension Other   . Hypertension Father   . Breast cancer Paternal Aunt 49  . Breast cancer Cousin 41  . Colon cancer Neg Hx   . Esophageal cancer Neg Hx   . Rectal cancer Neg Hx   . Stomach cancer Neg Hx     Social History Social History   Tobacco Use  . Smoking status: Former Research scientist (life sciences)  . Smokeless tobacco: Never Used  . Tobacco comment: quit 20 years ago  Substance Use Topics  . Alcohol use: No  . Drug use: No     Allergies   Patient has no known allergies.   Review of Systems Review of Systems  Constitutional: Positive for fatigue. Negative for chills, diaphoresis and fever.  HENT: Negative for congestion and rhinorrhea.   Respiratory: Negative for cough, chest tightness, shortness of breath, wheezing and stridor.   Cardiovascular: Negative for chest pain, palpitations, leg swelling and syncope.  Gastrointestinal: Positive for abdominal pain, constipation and nausea. Negative for diarrhea and vomiting.  Genitourinary: Negative for dysuria, flank pain and frequency.  Musculoskeletal: Negative  for back pain, neck pain and neck stiffness.  Skin: Negative for rash and wound.  Neurological: Positive for dizziness and weakness (fatigue). Negative for light-headedness, numbness and headaches.  Psychiatric/Behavioral: Negative for agitation and confusion.  All other systems reviewed and are negative.    Physical Exam Updated Vital Signs There were no vitals taken for this visit.  Physical Exam Vitals signs and nursing note reviewed.  Constitutional:      General: She is not in acute distress.    Appearance: She is cachectic. She is not ill-appearing, toxic-appearing or diaphoretic.  HENT:     Head: Normocephalic and atraumatic.     Nose: No congestion or rhinorrhea.     Mouth/Throat:     Pharynx: No oropharyngeal exudate or posterior oropharyngeal erythema.  Eyes:     Conjunctiva/sclera: Conjunctivae normal.     Pupils: Pupils are equal, round, and reactive to light.  Neck:     Musculoskeletal: Neck supple. No muscular tenderness.  Cardiovascular:     Rate and Rhythm: Normal rate and regular rhythm.     Pulses: Normal pulses.     Heart sounds: No murmur.  Pulmonary:     Effort: Pulmonary effort is normal. No respiratory distress.     Breath sounds: Normal breath sounds. No wheezing, rhonchi or rales.  Chest:     Chest wall: No tenderness.  Abdominal:     Palpations: Abdomen is soft.     Tenderness: There is no abdominal tenderness. There is no right CVA tenderness, left CVA tenderness or rebound.  Musculoskeletal:        General: No tenderness.  Skin:    General: Skin is warm and dry.     Capillary Refill: Capillary refill takes less than 2 seconds.     Findings: No erythema or rash.  Neurological:     General: No focal deficit present.     Mental Status: She is alert and oriented to person, place, and time.  Psychiatric:        Mood and Affect: Mood normal.      ED Treatments / Results  Labs (all labs ordered are listed, but only abnormal results are  displayed) Labs Reviewed  CBC WITH DIFFERENTIAL/PLATELET - Abnormal; Notable for the following components:      Result Value   WBC 12.5 (*)    RBC 3.30 (*)    Hemoglobin 9.3 (*)  HCT 28.9 (*)    RDW 21.8 (*)    Neutro Abs 12.3 (*)    Lymphs Abs 0.1 (*)    Monocytes Absolute 0.0 (*)    All other components within normal limits  COMPREHENSIVE METABOLIC PANEL - Abnormal; Notable for the following components:   Sodium 130 (*)    CO2 19 (*)    Glucose, Bld 221 (*)    BUN 43 (*)    Creatinine, Ser 1.17 (*)    Calcium 8.2 (*)    Total Protein 6.2 (*)    Albumin 2.2 (*)    AST 11 (*)    Alkaline Phosphatase 143 (*)    GFR calc non Af Amer 49 (*)    GFR calc Af Amer 56 (*)    All other components within normal limits  TROPONIN I (HIGH SENSITIVITY) - Abnormal; Notable for the following components:   Troponin I (High Sensitivity) 19 (*)    All other components within normal limits  CBG MONITORING, ED - Abnormal; Notable for the following components:   Glucose-Capillary 210 (*)    All other components within normal limits  URINE CULTURE  LIPASE, BLOOD  LACTIC ACID, PLASMA  TSH  LACTIC ACID, PLASMA  URINALYSIS, ROUTINE W REFLEX MICROSCOPIC  TROPONIN I (HIGH SENSITIVITY)    EKG None  Radiology No results found.  Procedures Procedures (including critical care time)  Medications Ordered in ED Medications  sodium chloride 0.9 % bolus 1,000 mL (1,000 mLs Intravenous New Bag/Given 08/10/18 1417)  ondansetron (ZOFRAN) injection 4 mg (4 mg Intravenous Given 08/10/18 1437)  HYDROmorphone (DILAUDID) injection 1 mg (1 mg Intravenous Given 08/10/18 1437)     Initial Impression / Assessment and Plan / ED Course  I have reviewed the triage vital signs and the nursing notes.  Pertinent labs & imaging results that were available during my care of the patient were reviewed by me and considered in my medical decision making (see chart for details).        Toni Peterson is a 69  y.o. female with a past medical history significant for cervical cancer with metastasis on chemotherapy, hypertension, hyperlipidemia, and malignant cachexia who presents with severe fatigue, lightheadedness, and near syncopal episode.  Patient reports that over the last week she has had decreased oral intake.  She reports feeling very lightheaded today and had a near syncopal episode.  She reports she did not fully pass out and did not fully fall to the ground or hit any part of her body.  She reports that she had no chest pain, shortness breath or palpitations.  She denies any change in her chronic abdominal pain from her cancer.  She reports nausea but no vomiting.  She reports she has had constipation but has had darkening of her urine.  She is concerned she is likely dehydrated causing her symptoms.  She denies other complaints on arrival.  On exam, lungs are clear and chest is nontender.  Abdomen is nontender on my exam.  Normal bowel sounds.  Patient reports she is still passing flatus.  No CVA tenderness or back tenderness on my exam.  Normal sensation and strength in extremities.  Normal pulses in extremities.  Patient is not hypotensive on my exam however EMS reportedly told nursing that she was positive for orthostatics.  Clinically I am concerned patient is dehydrated from her decreased oral intake likely from her cancer and chemotherapy.  Patient will be given fluids and monitor.  We will check  orthostatics after rehydration.  She will have screening laboratory testing to look for acute kidney injury or severe electrolyte abnormality.  With her urine change will check urinalysis.  Given her lack of abdominal pain changes or any respiratory or chest symptoms, will hold on imaging at this time after our shared decision made conversation.  If the  patient is feeling better after rehydration and labs are reassuring, anticipate discharge home to continue outpatient oncology management.  Patient  requested her home pain medicine and nausea medicine which was Zofran and Dilaudid.  She will be given the intravenous versions of these while she is awaiting work-up results.  Care transferred to oncoming team while awaiting results of diagnostic laboratory testing and reassessment.  If patient is feeling better, anticipate discharge home.   Final Clinical Impressions(s) / ED Diagnoses   Final diagnoses:  Non-intractable vomiting with nausea, unspecified vomiting type  Epigastric pain    .Clinical Impression: 1. Non-intractable vomiting with nausea, unspecified vomiting type   2. Epigastric pain     Disposition: Care transferred to following results of diagnostic laboratory testing.  Anticipate discharge if symptoms improve after rehydration.  This note was prepared with assistance of Systems analyst. Occasional wrong-word or sound-a-like substitutions may have occurred due to the inherent limitations of voice recognition software.      Tegeler, Gwenyth Allegra, MD 08/10/18 [phone]

## 2018-08-10 NOTE — ED Notes (Signed)
Bed: WA20 Expected date:  Expected time:  Means of arrival:  Comments: EMS 60s dizzy/weak/fall

## 2018-08-10 NOTE — ED Triage Notes (Signed)
Pt BIBA from home c/o fall d/t dizziness 1 hr PTA.  EMS reports positive orthostatic v/s.  Denies n/v/d.  Denies LOC, denies hitting head. Reports landing on her bottom.   Pt got 300 mL NaCl en route, reports dizziness has subsided. 20 g R AC.    Hx of metastatic cervical cancer, received chemo yesterday.

## 2018-08-10 NOTE — ED Notes (Signed)
Pt reports decrease in appetite for the last few weeks, denies active n/v/d at this time.

## 2018-08-10 NOTE — Discharge Instructions (Addendum)
Drink plenty of fluids and follow-up with your doctor next week for recheck °

## 2018-08-10 NOTE — Telephone Encounter (Signed)
Daughter called and left a message to call her. Toni Peterson fell this morning, her daughter was able to get her off the floor. She did not hit her head. She able to stand but is very weak. She is unable to walk very far.  Above message given to Dr. Alvy Bimler. Called back and spoke with Toni Peterson. Her daughter is with her. Instructed to go to ER to be evaluated. She verbalized understanding.

## 2018-08-10 NOTE — ED Notes (Signed)
ED Provider at bedside. 

## 2018-08-13 ENCOUNTER — Other Ambulatory Visit: Payer: Self-pay | Admitting: Hematology and Oncology

## 2018-08-13 ENCOUNTER — Encounter: Payer: Self-pay | Admitting: Hematology and Oncology

## 2018-08-14 ENCOUNTER — Telehealth: Payer: Self-pay

## 2018-08-14 ENCOUNTER — Inpatient Hospital Stay (HOSPITAL_COMMUNITY)
Admission: EM | Admit: 2018-08-14 | Discharge: 2018-09-09 | DRG: 871 | Disposition: E | Payer: BC Managed Care – PPO | Attending: Pulmonary Disease | Admitting: Pulmonary Disease

## 2018-08-14 ENCOUNTER — Other Ambulatory Visit (HOSPITAL_COMMUNITY): Payer: Self-pay

## 2018-08-14 ENCOUNTER — Encounter (HOSPITAL_COMMUNITY): Payer: Self-pay

## 2018-08-14 ENCOUNTER — Emergency Department (HOSPITAL_COMMUNITY): Payer: BC Managed Care – PPO

## 2018-08-14 ENCOUNTER — Other Ambulatory Visit: Payer: Self-pay

## 2018-08-14 ENCOUNTER — Ambulatory Visit: Payer: BC Managed Care – PPO

## 2018-08-14 ENCOUNTER — Other Ambulatory Visit: Payer: Self-pay | Admitting: Emergency Medicine

## 2018-08-14 ENCOUNTER — Inpatient Hospital Stay (HOSPITAL_COMMUNITY): Payer: BC Managed Care – PPO

## 2018-08-14 ENCOUNTER — Encounter: Payer: BC Managed Care – PPO | Admitting: Medical

## 2018-08-14 DIAGNOSIS — R5081 Fever presenting with conditions classified elsewhere: Secondary | ICD-10-CM | POA: Diagnosis present

## 2018-08-14 DIAGNOSIS — N170 Acute kidney failure with tubular necrosis: Secondary | ICD-10-CM | POA: Diagnosis present

## 2018-08-14 DIAGNOSIS — T451X5A Adverse effect of antineoplastic and immunosuppressive drugs, initial encounter: Secondary | ICD-10-CM | POA: Diagnosis present

## 2018-08-14 DIAGNOSIS — J189 Pneumonia, unspecified organism: Secondary | ICD-10-CM | POA: Diagnosis present

## 2018-08-14 DIAGNOSIS — R6521 Severe sepsis with septic shock: Secondary | ICD-10-CM | POA: Diagnosis present

## 2018-08-14 DIAGNOSIS — Z85038 Personal history of other malignant neoplasm of large intestine: Secondary | ICD-10-CM

## 2018-08-14 DIAGNOSIS — E785 Hyperlipidemia, unspecified: Secondary | ICD-10-CM | POA: Diagnosis present

## 2018-08-14 DIAGNOSIS — C189 Malignant neoplasm of colon, unspecified: Secondary | ICD-10-CM | POA: Diagnosis present

## 2018-08-14 DIAGNOSIS — E119 Type 2 diabetes mellitus without complications: Secondary | ICD-10-CM | POA: Diagnosis present

## 2018-08-14 DIAGNOSIS — Z66 Do not resuscitate: Secondary | ICD-10-CM | POA: Diagnosis not present

## 2018-08-14 DIAGNOSIS — C7951 Secondary malignant neoplasm of bone: Secondary | ICD-10-CM | POA: Diagnosis present

## 2018-08-14 DIAGNOSIS — Z515 Encounter for palliative care: Secondary | ICD-10-CM | POA: Diagnosis not present

## 2018-08-14 DIAGNOSIS — K56609 Unspecified intestinal obstruction, unspecified as to partial versus complete obstruction: Secondary | ICD-10-CM | POA: Diagnosis present

## 2018-08-14 DIAGNOSIS — D61810 Antineoplastic chemotherapy induced pancytopenia: Secondary | ICD-10-CM | POA: Diagnosis present

## 2018-08-14 DIAGNOSIS — E872 Acidosis: Secondary | ICD-10-CM | POA: Diagnosis present

## 2018-08-14 DIAGNOSIS — C539 Malignant neoplasm of cervix uteri, unspecified: Secondary | ICD-10-CM | POA: Diagnosis present

## 2018-08-14 DIAGNOSIS — E2740 Unspecified adrenocortical insufficiency: Secondary | ICD-10-CM | POA: Diagnosis present

## 2018-08-14 DIAGNOSIS — Y95 Nosocomial condition: Secondary | ICD-10-CM | POA: Diagnosis present

## 2018-08-14 DIAGNOSIS — Z978 Presence of other specified devices: Secondary | ICD-10-CM

## 2018-08-14 DIAGNOSIS — E861 Hypovolemia: Secondary | ICD-10-CM | POA: Diagnosis present

## 2018-08-14 DIAGNOSIS — E875 Hyperkalemia: Secondary | ICD-10-CM | POA: Diagnosis present

## 2018-08-14 DIAGNOSIS — N1330 Unspecified hydronephrosis: Secondary | ICD-10-CM | POA: Diagnosis present

## 2018-08-14 DIAGNOSIS — Z20828 Contact with and (suspected) exposure to other viral communicable diseases: Secondary | ICD-10-CM | POA: Diagnosis present

## 2018-08-14 DIAGNOSIS — L8990 Pressure ulcer of unspecified site, unspecified stage: Secondary | ICD-10-CM | POA: Insufficient documentation

## 2018-08-14 DIAGNOSIS — E440 Moderate protein-calorie malnutrition: Secondary | ICD-10-CM | POA: Diagnosis present

## 2018-08-14 DIAGNOSIS — C530 Malignant neoplasm of endocervix: Secondary | ICD-10-CM

## 2018-08-14 DIAGNOSIS — I5032 Chronic diastolic (congestive) heart failure: Secondary | ICD-10-CM | POA: Diagnosis present

## 2018-08-14 DIAGNOSIS — J80 Acute respiratory distress syndrome: Secondary | ICD-10-CM | POA: Diagnosis present

## 2018-08-14 DIAGNOSIS — Z87891 Personal history of nicotine dependence: Secondary | ICD-10-CM

## 2018-08-14 DIAGNOSIS — D703 Neutropenia due to infection: Secondary | ICD-10-CM | POA: Diagnosis present

## 2018-08-14 DIAGNOSIS — I110 Hypertensive heart disease with heart failure: Secondary | ICD-10-CM | POA: Diagnosis present

## 2018-08-14 DIAGNOSIS — A409 Streptococcal sepsis, unspecified: Secondary | ICD-10-CM | POA: Diagnosis present

## 2018-08-14 DIAGNOSIS — J9601 Acute respiratory failure with hypoxia: Secondary | ICD-10-CM | POA: Diagnosis present

## 2018-08-14 DIAGNOSIS — D709 Neutropenia, unspecified: Secondary | ICD-10-CM | POA: Diagnosis not present

## 2018-08-14 LAB — BASIC METABOLIC PANEL
Anion gap: 9 (ref 5–15)
BUN: 40 mg/dL — ABNORMAL HIGH (ref 8–23)
CO2: 20 mmol/L — ABNORMAL LOW (ref 22–32)
Calcium: 7.8 mg/dL — ABNORMAL LOW (ref 8.9–10.3)
Chloride: 101 mmol/L (ref 98–111)
Creatinine, Ser: 1.13 mg/dL — ABNORMAL HIGH (ref 0.44–1.00)
GFR calc Af Amer: 59 mL/min — ABNORMAL LOW (ref >60)
GFR calc non Af Amer: 51 mL/min — ABNORMAL LOW (ref >60)
Glucose, Bld: 156 mg/dL — ABNORMAL HIGH (ref 70–99)
Potassium: 5.4 mmol/L — ABNORMAL HIGH (ref 3.5–5.1)
Sodium: 130 mmol/L — ABNORMAL LOW (ref 135–145)

## 2018-08-14 LAB — BLOOD GAS, ARTERIAL
Acid-base deficit: 3.7 mmol/L — ABNORMAL HIGH (ref 0.0–2.0)
Acid-base deficit: 5.2 mmol/L — ABNORMAL HIGH (ref 0.0–2.0)
Bicarbonate: 20 mmol/L (ref 20.0–28.0)
Bicarbonate: 18.8 mmol/L — ABNORMAL LOW (ref 20.0–28.0)
Drawn by: 225631
Drawn by: 232811
FIO2: 100
FIO2: 100
MECHVT: 0.39 mL
MECHVT: 380 mL
O2 Saturation: 97.5 %
O2 Saturation: 89.1 %
PEEP: 8 cmH2O
PEEP: 12 cmH2O
Patient temperature: 99
Patient temperature: 99
RATE: 24 resp/min
RATE: 24 resp/min
pCO2 arterial: 32.8 mmHg (ref 32.0–48.0)
pCO2 arterial: 33.7 mmHg (ref 32.0–48.0)
pH, Arterial: 7.403 (ref 7.350–7.450)
pH, Arterial: 7.368 (ref 7.350–7.450)
pO2, Arterial: 107 mmHg (ref 83.0–108.0)
pO2, Arterial: 65.7 mmHg — ABNORMAL LOW (ref 83.0–108.0)

## 2018-08-14 LAB — CBC
HCT: 29.2 % — ABNORMAL LOW (ref 36.0–46.0)
Hemoglobin: 9.5 g/dL — ABNORMAL LOW (ref 12.0–15.0)
MCH: 28.4 pg (ref 26.0–34.0)
MCHC: 32.5 g/dL (ref 30.0–36.0)
MCV: 87.4 fL (ref 80.0–100.0)
Platelets: 32 10*3/uL — ABNORMAL LOW (ref 150–400)
RBC: 3.34 MIL/uL — ABNORMAL LOW (ref 3.87–5.11)
RDW: 22.9 % — ABNORMAL HIGH (ref 11.5–15.5)
WBC: 1.5 10*3/uL — ABNORMAL LOW (ref 4.0–10.5)
nRBC: 0 % (ref 0.0–0.2)

## 2018-08-14 LAB — D-DIMER, QUANTITATIVE: D-Dimer, Quant: 2.01 ug/mL-FEU — ABNORMAL HIGH (ref 0.00–0.50)

## 2018-08-14 LAB — CBC WITH DIFFERENTIAL/PLATELET
Abs Immature Granulocytes: 0 10*3/uL (ref 0.00–0.07)
Basophils Absolute: 0 10*3/uL (ref 0.0–0.1)
Basophils Relative: 2 %
Eosinophils Absolute: 0 10*3/uL (ref 0.0–0.5)
Eosinophils Relative: 0 %
HCT: 26.6 % — ABNORMAL LOW (ref 36.0–46.0)
Hemoglobin: 8.2 g/dL — ABNORMAL LOW (ref 12.0–15.0)
Immature Granulocytes: 0 %
Lymphocytes Relative: 2 %
Lymphs Abs: 0 10*3/uL — ABNORMAL LOW (ref 0.7–4.0)
MCH: 28 pg (ref 26.0–34.0)
MCHC: 30.8 g/dL (ref 30.0–36.0)
MCV: 90.8 fL (ref 80.0–100.0)
Monocytes Absolute: 0.1 10*3/uL (ref 0.1–1.0)
Monocytes Relative: 4 %
Neutro Abs: 1.2 10*3/uL — ABNORMAL LOW (ref 1.7–7.7)
Neutrophils Relative %: 92 %
Platelets: 17 10*3/uL — CL (ref 150–400)
RBC: 2.93 MIL/uL — ABNORMAL LOW (ref 3.87–5.11)
RDW: 23.1 % — ABNORMAL HIGH (ref 11.5–15.5)
WBC: 1.3 10*3/uL — CL (ref 4.0–10.5)
nRBC: 1.6 % — ABNORMAL HIGH (ref 0.0–0.2)

## 2018-08-14 LAB — COMPREHENSIVE METABOLIC PANEL
ALT: 21 U/L (ref 0–44)
AST: 25 U/L (ref 15–41)
Albumin: 1.4 g/dL — ABNORMAL LOW (ref 3.5–5.0)
Alkaline Phosphatase: 116 U/L (ref 38–126)
Anion gap: 9 (ref 5–15)
BUN: 41 mg/dL — ABNORMAL HIGH (ref 8–23)
CO2: 21 mmol/L — ABNORMAL LOW (ref 22–32)
Calcium: 7.4 mg/dL — ABNORMAL LOW (ref 8.9–10.3)
Chloride: 102 mmol/L (ref 98–111)
Creatinine, Ser: 1.24 mg/dL — ABNORMAL HIGH (ref 0.44–1.00)
GFR calc Af Amer: 52 mL/min — ABNORMAL LOW (ref >60)
GFR calc non Af Amer: 45 mL/min — ABNORMAL LOW (ref >60)
Glucose, Bld: 118 mg/dL — ABNORMAL HIGH (ref 70–99)
Potassium: 6.5 mmol/L (ref 3.5–5.1)
Sodium: 132 mmol/L — ABNORMAL LOW (ref 135–145)
Total Bilirubin: 1.3 mg/dL — ABNORMAL HIGH (ref 0.3–1.2)
Total Protein: 4.7 g/dL — ABNORMAL LOW (ref 6.5–8.1)

## 2018-08-14 LAB — PROTIME-INR
INR: 1.7 — ABNORMAL HIGH (ref 0.8–1.2)
Prothrombin Time: 19.7 seconds — ABNORMAL HIGH (ref 11.4–15.2)

## 2018-08-14 LAB — LACTIC ACID, PLASMA
Lactic Acid, Venous: 2.7 mmol/L (ref 0.5–1.9)
Lactic Acid, Venous: 3.6 mmol/L (ref 0.5–1.9)

## 2018-08-14 LAB — GLUCOSE, CAPILLARY
Glucose-Capillary: 128 mg/dL — ABNORMAL HIGH (ref 70–99)
Glucose-Capillary: 113 mg/dL — ABNORMAL HIGH (ref 70–99)

## 2018-08-14 LAB — POTASSIUM: Potassium: 6.1 mmol/L — ABNORMAL HIGH (ref 3.5–5.1)

## 2018-08-14 LAB — SARS CORONAVIRUS 2 BY RT PCR (HOSPITAL ORDER, PERFORMED IN ~~LOC~~ HOSPITAL LAB): SARS Coronavirus 2: NEGATIVE

## 2018-08-14 LAB — MRSA PCR SCREENING: MRSA by PCR: NEGATIVE

## 2018-08-14 MED ORDER — IOHEXOL 350 MG/ML SOLN
100.0000 mL | Freq: Once | INTRAVENOUS | Status: AC | PRN
  Administered 2018-08-14: 100 mL via INTRAVENOUS

## 2018-08-14 MED ORDER — SODIUM CHLORIDE 0.9% FLUSH: 10.0000 mL | INTRAVENOUS | Status: DC | PRN

## 2018-08-14 MED ORDER — SODIUM CHLORIDE (PF) 0.9 % IJ SOLN
INTRAMUSCULAR | Status: AC
  Filled 2018-08-14: qty 50

## 2018-08-14 MED ORDER — FENTANYL BOLUS VIA INFUSION
25.0000 ug | INTRAVENOUS | Status: DC | PRN
  Administered 2018-08-14 – 2018-08-15 (×4): 25 ug via INTRAVENOUS
  Filled 2018-08-14: qty 25

## 2018-08-14 MED ORDER — SODIUM CHLORIDE 0.9% FLUSH
10.0000 mL | Freq: Two times a day (BID) | INTRAVENOUS | Status: DC
  Administered 2018-08-14: 10 mL

## 2018-08-14 MED ORDER — DEXTROSE 50 % IV SOLN
1.0000 | Freq: Once | INTRAVENOUS | Status: AC
  Administered 2018-08-14: 16:00:00 50 mL via INTRAVENOUS
  Filled 2018-08-14: qty 50

## 2018-08-14 MED ORDER — MIDAZOLAM HCL 2 MG/2ML IJ SOLN
2.0000 mg | Freq: Once | INTRAMUSCULAR | Status: AC
  Administered 2018-08-14: 2 mg via INTRAVENOUS

## 2018-08-14 MED ORDER — ORAL CARE MOUTH RINSE
15.0000 mL | OROMUCOSAL | Status: DC
  Administered 2018-08-14 – 2018-08-15 (×5): 15 mL via OROMUCOSAL

## 2018-08-14 MED ORDER — VANCOMYCIN HCL IN DEXTROSE 1-5 GM/200ML-% IV SOLN
1000.0000 mg | Freq: Once | INTRAVENOUS | Status: AC
  Administered 2018-08-14: 1000 mg via INTRAVENOUS
  Filled 2018-08-14: qty 200

## 2018-08-14 MED ORDER — SODIUM CHLORIDE 0.9 % IV SOLN
2.0000 g | Freq: Two times a day (BID) | INTRAVENOUS | Status: DC
  Administered 2018-08-15: 02:00:00 2 g via INTRAVENOUS
  Filled 2018-08-14 (×2): qty 2

## 2018-08-14 MED ORDER — NOREPINEPHRINE 4 MG/250ML-% IV SOLN
0.0000 ug/min | INTRAVENOUS | Status: DC
  Administered 2018-08-14: 20:00:00 15 ug/min via INTRAVENOUS
  Administered 2018-08-15 (×2): 60 ug/min via INTRAVENOUS
  Administered 2018-08-15: 10 ug/min via INTRAVENOUS
  Administered 2018-08-15 (×4): 60 ug/min via INTRAVENOUS
  Filled 2018-08-14 (×8): qty 250

## 2018-08-14 MED ORDER — FENTANYL CITRATE (PF) 100 MCG/2ML IJ SOLN
50.0000 ug | Freq: Once | INTRAMUSCULAR | Status: AC
  Administered 2018-08-14: 19:00:00 50 ug via INTRAVENOUS

## 2018-08-14 MED ORDER — INSULIN ASPART 100 UNIT/ML IV SOLN
5.0000 [IU] | Freq: Once | INTRAVENOUS | Status: AC
  Administered 2018-08-14: 5 [IU] via INTRAVENOUS
  Filled 2018-08-14: qty 0.05

## 2018-08-14 MED ORDER — SODIUM CHLORIDE 0.9 % IV BOLUS
1000.0000 mL | Freq: Once | INTRAVENOUS | Status: AC
  Administered 2018-08-14: 12:00:00 1000 mL via INTRAVENOUS

## 2018-08-14 MED ORDER — CHLORHEXIDINE GLUCONATE CLOTH 2 % EX PADS
6.0000 | MEDICATED_PAD | Freq: Every day | CUTANEOUS | Status: DC
  Administered 2018-08-14: 21:00:00 6 via TOPICAL

## 2018-08-14 MED ORDER — MIDAZOLAM HCL 2 MG/2ML IJ SOLN
INTRAMUSCULAR | Status: AC
  Filled 2018-08-14: qty 2

## 2018-08-14 MED ORDER — SODIUM CHLORIDE 0.9 % IV SOLN
2.0000 g | Freq: Once | INTRAVENOUS | Status: AC
  Administered 2018-08-14: 14:00:00 2 g via INTRAVENOUS
  Filled 2018-08-14: qty 2

## 2018-08-14 MED ORDER — PANTOPRAZOLE SODIUM 40 MG IV SOLR
40.0000 mg | INTRAVENOUS | Status: DC
  Administered 2018-08-14: 22:00:00 40 mg via INTRAVENOUS
  Filled 2018-08-14: qty 40

## 2018-08-14 MED ORDER — ACETAMINOPHEN 650 MG RE SUPP: 650.0000 mg | RECTAL | Status: DC | PRN

## 2018-08-14 MED ORDER — SODIUM CHLORIDE 0.9 % IV BOLUS
1000.0000 mL | Freq: Once | INTRAVENOUS | Status: AC
  Administered 2018-08-14: 22:00:00 1000 mL via INTRAVENOUS

## 2018-08-14 MED ORDER — FENTANYL CITRATE (PF) 100 MCG/2ML IJ SOLN
INTRAMUSCULAR | Status: AC
  Filled 2018-08-14: qty 2

## 2018-08-14 MED ORDER — LACTATED RINGERS IV SOLN
INTRAVENOUS | Status: DC
  Administered 2018-08-14 (×2): via INTRAVENOUS

## 2018-08-14 MED ORDER — FENTANYL CITRATE (PF) 100 MCG/2ML IJ SOLN: 25.0000 ug | Freq: Once | INTRAMUSCULAR | Status: DC

## 2018-08-14 MED ORDER — ACETAMINOPHEN 325 MG PO TABS
650.0000 mg | ORAL_TABLET | Freq: Four times a day (QID) | ORAL | Status: DC | PRN
  Administered 2018-08-15: 08:00:00 650 mg
  Filled 2018-08-14: qty 2

## 2018-08-14 MED ORDER — ETOMIDATE 2 MG/ML IV SOLN
10.0000 mg | Freq: Once | INTRAVENOUS | Status: AC
  Administered 2018-08-14: 19:00:00 10 mg via INTRAVENOUS

## 2018-08-14 MED ORDER — FENTANYL 2500MCG IN NS 250ML (10MCG/ML) PREMIX INFUSION
25.0000 ug/h | INTRAVENOUS | Status: DC
  Administered 2018-08-14: 21:00:00 25 ug/h via INTRAVENOUS
  Filled 2018-08-14: qty 250

## 2018-08-14 MED ORDER — INSULIN ASPART 100 UNIT/ML ~~LOC~~ SOLN
1.0000 [IU] | SUBCUTANEOUS | Status: DC
  Administered 2018-08-14: 21:00:00 1 [IU] via SUBCUTANEOUS
  Filled 2018-08-14: qty 0.03

## 2018-08-14 MED ORDER — BISACODYL 10 MG RE SUPP: 10.0000 mg | Freq: Every day | RECTAL | Status: DC | PRN

## 2018-08-14 MED ORDER — MIDAZOLAM HCL 2 MG/2ML IJ SOLN
1.0000 mg | INTRAMUSCULAR | Status: DC | PRN
  Administered 2018-08-14 – 2018-08-15 (×2): 1 mg via INTRAVENOUS
  Filled 2018-08-14 (×2): qty 2

## 2018-08-14 MED ORDER — MORPHINE SULFATE (PF) 4 MG/ML IV SOLN
4.0000 mg | Freq: Once | INTRAVENOUS | Status: AC
  Administered 2018-08-14: 4 mg via INTRAVENOUS
  Filled 2018-08-14: qty 1

## 2018-08-14 MED ORDER — ROCURONIUM BROMIDE 50 MG/5ML IV SOLN
60.0000 mg | Freq: Once | INTRAVENOUS | Status: AC
  Administered 2018-08-14: 19:00:00 60 mg via INTRAVENOUS
  Filled 2018-08-14: qty 6

## 2018-08-14 MED ORDER — DOCUSATE SODIUM 50 MG/5ML PO LIQD
100.0000 mg | Freq: Two times a day (BID) | ORAL | Status: DC | PRN
  Filled 2018-08-14: qty 10

## 2018-08-14 MED ORDER — HYDROCORTISONE NA SUCCINATE PF 100 MG IJ SOLR
50.0000 mg | Freq: Four times a day (QID) | INTRAMUSCULAR | Status: DC
  Administered 2018-08-15 (×2): 50 mg via INTRAVENOUS
  Filled 2018-08-14 (×2): qty 2

## 2018-08-14 MED ORDER — VANCOMYCIN HCL IN DEXTROSE 750-5 MG/150ML-% IV SOLN
750.0000 mg | INTRAVENOUS | Status: DC
  Filled 2018-08-14: qty 150

## 2018-08-14 MED ORDER — ONDANSETRON HCL 4 MG/2ML IJ SOLN: 4.0000 mg | Freq: Four times a day (QID) | INTRAMUSCULAR | Status: DC | PRN

## 2018-08-14 MED ORDER — CHLORHEXIDINE GLUCONATE 0.12% ORAL RINSE (MEDLINE KIT)
15.0000 mL | Freq: Two times a day (BID) | OROMUCOSAL | Status: DC
  Administered 2018-08-14 – 2018-08-15 (×2): 15 mL via OROMUCOSAL

## 2018-08-14 NOTE — H&P (Signed)
NAME:  Toni Peterson, MRN:  [id number], DOB:  August 10, 1950, LOS: 0 ADMISSION DATE:  08/31/2018, CONSULTATION DATE:  09/03/2018 REFERRING MD:  Dr. Venora Peterson EDP, CHIEF COMPLAINT:  Respiratory failure   Brief History   68 year old female with cervical and colon cancer on palliative chemotherapy intubated and admitted 7/6 for presumed pneumonia.   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 She is followed by Dr. Alvy Peterson. Other medical history is significant for HTN, HFpEF, and DM. She presented to [facility] ED 7/6 with complaints of nausea/vomiting and shortness of breath x several days. She was noted to be febrile and was immediately placed on 15L NRB with slow improvement of her oxygen saturation (76% on room air). CXR with bilateral infiltrates. She was started on broad spectrum antibiotics for presumed pneumonia. Despite oxygen therapy her work of breathing continued to be excessive and she ultimately required intubation upon arrival of Dr. Halford Peterson. There was concern for small bowel obstruction so NGT was placed with immediate 251mL bilious output.   Past Medical History   has a past medical history of Adenoma of left adrenal gland (2019), Cervical cancer ([place]), Colon cancer ([place]), Diabetes ([place]), Diverticulosis (01/2018), Family history of breast cancer, Grade II diastolic dysfunction (25/36/6440), Hemorrhoids (01/2018), History of cardiomegaly (2003), Hyperlipidemia, Hypertension, Hypertrophy of inter-atrial septum (12/20/2017), Multiple thyroid nodules (05/2017), Other abnormal glucose, PONV (postoperative nausea and vomiting), and PONV (postoperative nausea and vomiting) (02/28/2018).  Significant Hospital Events   7/6 admitted, intubated  Consults:    Procedures:  ETT 7/6 > CVL 7/6 >  Significant Diagnostic Tests:  CTA chest 7/6 > negative for PE. Numerous new nodular and  masslike pulmonary opacities with ill-defined margins throughout. These presumably represent metastatic disease, however very rapid evolution comparison to PET-CT dated 07/14/2018 is unusual and differential considerations of multifocal infection and organizing pneumonia (including secondary to drug toxicity) remain. Redemonstrated sclerotic osseous metastatic disease. CT abdomen/pelvis 7/6 > No definite acute CT abnormality of the abdomen or pelvis to explain pain. Unchanged moderate left hydronephrosis and proximal hydroureter. New mild right hydronephrosis and hydroureter without obvious distal obstructing calculus or lesion. Redemonstrated findings of advanced cervical malignancy, including Post curettage and resection appearance of the cervix, with fluid in the endometrial and cervical cavity and vagina, small volume ascites with hazy, nodular appearance of the omentum and peritoneal lining, likely reflecting peritoneal metastatic disease, and sclerotic osseous metastatic disease.  Micro Data:  Blood 7/6 > Tracheal aspirate 7/6 >  Antimicrobials:  Cefepime 7/6 > Vancomycin 7/6 >  Interim history/subjective:    Objective   Blood pressure (!) 56/46, pulse 84, temperature (!) 101.2 F (38.4 C), temperature source Rectal, resp. rate (!) 24, height 5\' 1"  (1.549 m), weight 68 kg, SpO2 (!) 84 %.        Intake/Output Summary (Last 24 hours) at 08/10/2018 1914 Last data filed at 08/21/2018 1132 Gross per 24 hour  Intake 250 ml  Output -  Net 250 ml   Filed Weights   08/24/2018 1144  Weight: 68 kg    Examination: General: Elderly appearing female in NAD on vent HENT: [place]/AT, PERRL, no JVD Lungs: Bilaterally course.  Cardiovascular: RRR, no MRG. Strong peripheral pulses.  Abdomen: Soft, non-tender, non-distended Extremities: No acute deformity Neuro: [name] Problem list     Assessment & Plan:   Acute hypoxemic respiratory failure: secondary  to pneumonia. She is  immunocompromised due to chemotherapy. Several areas on likely mets described on CT chest.  - Full vent support - Broad spectrum ABX - Follow culture - ABG, CXR follow - VAP bundle - Fentanyl infusion for sedation. Keep RASS -1 to -2.   Shock: septic shock in the setting of pneumonia, futher complicated by intubation medications and sedatives. I also suspect she is hypovolemic. 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  - IVF resuscitation - Norepinephrine infusion to keep MAP > 72mmHg - Ensure lactic clearing.  - Consider echocardiogram if shock not resolved with IVF.  - Stress dose steroids.  - Consider albumin. Newly low on labs and ascites on exam.   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 She is managed with palliative gemcitabine. Dr. Alvy Peterson suggests her prognosis is about 6 months.  - Holding gemcitabine - Consider oncology consultation  Hyperkalemia: hemolyzed specimen.  - Redraw  AKI: Creatinine 0.97 one month PTA.  - Follow BMP - Hydrate  Anemia: hemoglobin at recent baseline Thrombocytopenia: 32 on admit. Much lower than recent baseline.  - Follow CBC - Check INR  L hydronephrosis and proximal ureter. Unchanged from 6/5 PET.  - Place foley - Consider urology consultation.   Chronic HFpEF - Careful with IVF, although I believe she is hypovolemic.   DM: managed with diet. Glucose 118 on admit.  - Follow glucose on chemistry.   Best practice:  Diet: NPO Pain/Anxiety/Delirium protocol (if indicated):  VAP protocol (if indicated): Per protocol DVT prophylaxis: held due to thrombocytopenia GI prophylaxis: Protonix Glucose control: Na Mobility: BR Code Status: Full Family Communication: Daughter updated via phone by Dr. Halford Peterson. Wish for full code.  Disposition: ICU. Critically ill.   Labs   CBC: Recent Labs  Lab 08/09/18 0905 08/10/18  1418 08/13/2018 1158  WBC 14.2* 12.5* 1.5*  NEUTROABS 13.4* 12.3*  --   HGB 9.0* 9.3* 9.5*  HCT 27.5* 28.9* 29.2*  MCV 85.7 87.6 87.4  PLT 363 272 32*    Basic Metabolic Panel: Recent Labs  Lab 08/09/18 0905 08/10/18 1418 08/31/2018 1330 08/23/2018 1410  NA 130* 130* 132*  --   K 5.3* 4.6 6.5* 6.1*  CL 97* 100 102  --   CO2 19* 19* 21*  --   GLUCOSE 125* 221* 118*  --   BUN 44* 43* 41*  --   CREATININE 1.39* 1.17* 1.24*  --   CALCIUM 9.3 8.2* 7.4*  --   MG 2.0  --   --   --    GFR: Estimated Creatinine Clearance: 39.4 mL/min (A) (by C-G formula based on SCr of 1.24 mg/dL (H)). Recent Labs  Lab 08/09/18 0905 08/10/18 1418 08/10/18 1602 08/20/2018 1158 08/23/2018 1159  WBC 14.2* 12.5*  --  1.5*  --   LATICACIDVEN  --  1.8 1.2  --  2.7*    Liver Function Tests: Recent Labs  Lab 08/09/18 0905 08/10/18 1418 08/16/2018 1330  AST 10* 11* 25  ALT 18 16 21   ALKPHOS 170* 143* 116  BILITOT 0.3 0.7 1.3*  PROT 6.6 6.2* 4.7*  ALBUMIN 2.0* 2.2* 1.4*   Recent Labs  Lab 08/10/18 1418  LIPASE 20   No results for input(s): AMMONIA in the last 168 hours.  ABG    Component Value Date/Time   TCO2 25 02/28/2018 0602     Coagulation Profile: No  results for input(s): INR, PROTIME in the last 168 hours.  Cardiac Enzymes: No results for input(s): CKTOTAL, CKMB, CKMBINDEX, TROPONINI in the last 168 hours.  HbA1C: No results found for: HGBA1C  CBG: Recent Labs  Lab 08/10/18 1357  GLUCAP 210*    Review of Systems:   Unable as patient is intubated and encephalopathic.  Past Medical History  She,  has a past medical history of Adenoma of left adrenal gland (2019), Cervical cancer ([place]), Colon cancer ([place]), Diabetes ([place]), Diverticulosis (01/2018), Family history of breast cancer, Grade II diastolic dysfunction (96/29/5284), Hemorrhoids (01/2018), History of cardiomegaly (2003), Hyperlipidemia, Hypertension, Hypertrophy of inter-atrial septum (12/20/2017), Multiple thyroid  nodules (05/2017), Other abnormal glucose, PONV (postoperative nausea and vomiting), and PONV (postoperative nausea and vomiting) (02/28/2018).   Surgical History    Past Surgical History:  Procedure Laterality Date  . ADENOIDECTOMY    . COLONOSCOPY  2013   said it was in [place]   . COLONOSCOPY  01/2018  . DILATION AND CURETTAGE OF UTERUS    . DILATION AND CURETTAGE OF UTERUS  03/11/2011   Procedure: DILATATION AND CURETTAGE;  Surgeon: Darlyn Chamber, MD;  Location: [facility];  Service: Gynecology;  Laterality: N/A;  . HYSTEROSCOPY W/D&C  09-02-2006   ENDOMETRIAL BX'S  . HYSTEROSCOPY WITH RESECTOSCOPE  03/11/2011   Procedure: HYSTEROSCOPY WITH RESECTOSCOPE;  Surgeon: Darlyn Chamber, MD;  Location: [facility];  Service: Gynecology;  Laterality: N/A;  . IR FLUORO GUIDED NEEDLE PLC ASPIRATION/INJECTION LOC  12/21/2017  . IR IMAGING GUIDED PORT INSERTION  12/23/2017  . OPERATIVE ULTRASOUND N/A 03/20/2018   Procedure: OPERATIVE ULTRASOUND;  Surgeon: Gery Pray, MD;  Location: [facility];  Service: Gynecology;  Laterality: N/A;  . OPERATIVE ULTRASOUND N/A 03/27/2018   Procedure: OPERATIVE ULTRASOUND;  Surgeon: Gery Pray, MD;  Location: [facility];  Service: Gynecology;  Laterality: N/A;  . TANDEM RING INSERTION N/A 02/28/2018   Procedure: TANDEM RING INSERTION;  Surgeon: Gery Pray, MD;  Location: [facility];  Service: Urology;  Laterality: N/A;  . TANDEM RING INSERTION N/A 03/07/2018   Procedure: TANDEM RING INSERTION;  Surgeon: Gery Pray, MD;  Location: [facility];  Service: Urology;  Laterality: N/A;  . TANDEM RING INSERTION N/A 03/16/2018   Procedure: TANDEM RING INSERTION FOR HIGH DOSE RATE RADIATION THERAPY WITH ULTRASOUND;  Surgeon: Gery Pray, MD;  Location: [place];  Service: Gynecology;  Laterality: N/A;  . TANDEM RING INSERTION N/A 03/20/2018   Procedure:  TANDEM RING INSERTION;  Surgeon: Gery Pray, MD;  Location: [place];  Service: Gynecology;  Laterality: N/A;  . TANDEM RING INSERTION N/A 03/27/2018   Procedure: TANDEM RING INSERTION;  Surgeon: Gery Pray, MD;  Location: [place];  Service: Gynecology;  Laterality: N/A;     Social History   reports that she has quit smoking. She has never used smokeless tobacco. She reports that she does not drink alcohol or use drugs.   Family History   Her family history includes Breast cancer (age of onset: 93) in her paternal aunt; Breast cancer (age of onset: 40) in her cousin; Heart attack in her mother; Hyperlipidemia in an other family member; Hypertension in her father, mother, and another family member; Stroke in her brother. There is no history of Colon cancer, Esophageal cancer, Rectal cancer, or Stomach cancer.   Allergies No Known Allergies   Home Medications  Prior to Admission medications  Medication Sig Start Date End Date Taking? Authorizing Provider  amLODipine (NORVASC) 10 MG tablet Take 10 mg by mouth every evening.  09/01/17  Yes [provider]  atenolol (TENORMIN) 50 MG tablet Take 50 mg by mouth every evening.  07/25/17  Yes [provider]  dexamethasone (DECADRON) 4 MG tablet Take 1 tablet (4 mg total) by mouth 2 (two) times daily. 07/13/18  Yes Gorsuch, Ni, MD  enalapril (VASOTEC) 20 MG tablet Take 20 mg by mouth every evening.  05/11/18  Yes [provider]  fish oil-omega-3 fatty acids 1000 MG capsule Take 1 g by mouth daily.    Yes [provider]  mirtazapine (REMERON) 30 MG tablet TAKE 1 TABLET (30 MG TOTAL) BY MOUTH AT BEDTIME. 06/24/18  Yes Gorsuch, Ni, MD  spironolactone (ALDACTONE) 25 MG tablet Take 25 mg by mouth daily. 07/26/18  Yes [provider]  HYDROmorphone (DILAUDID) 4 MG tablet Take 1 tablet (4 mg total) by mouth every 4 (four) hours as needed for severe pain. 08/02/18   Heath Lark,  MD  lidocaine-prilocaine (EMLA) cream Apply to affected area once 12/28/17   Tish Men, MD  ondansetron (ZOFRAN) 8 MG tablet Take 1 tablet (8 mg total) by mouth every 8 (eight) hours as needed. 08/02/18   Heath Lark, MD  prochlorperazine (COMPAZINE) 10 MG tablet Take 1 tablet (10 mg total) by mouth every 6 (six) hours as needed (Nausea or vomiting). 05/18/18   Heath Lark, MD     Critical care time: 60 mins     Georgann Housekeeper, AGACNP-BC [place] Pager [phone] or [phone]  08/16/2018 8:07 PM

## 2018-08-14 NOTE — Progress Notes (Signed)
A consult was received from an ED physician for vancomycin and cefepime per pharmacy dosing.  The patient's profile has been reviewed for ht/wt/allergies/indication/available labs.    A one time order has been placed for vancomycin 1000 mg and cefepime 2gm.  Further antibiotics/pharmacy consults should be ordered by admitting physician if indicated.                       Thank you, Lynelle Doctor 08/10/2018  1:36 PM

## 2018-08-14 NOTE — Procedures (Signed)
Intubation Procedure Note Toni Peterson [id number] [birth year]  Procedure: Intubation Indications: Respiratory insufficiency  Procedure Details Consent: Risks of procedure as well as the alternatives and risks of each were explained to the (patient/caregiver).  Consent for procedure obtained. Time Out: Verified patient identification, verified procedure, site/side was marked, verified correct patient position, special equipment/implants available, medications/allergies/relevent history reviewed, required imaging and test results available.  Performed  Maximum sterile technique was used including gloves, hand hygiene and mask.  MAC and 3  Given 2 mg versed, 50 mcg fentanyl, 10 mg etomidate, 60 mg rocuronium.  Inserted 7.5 ETT to 22 cm at lip using glidescope.  Confirmed with CO2 detector and ausculation.  Evaluation Hemodynamic Status: Persistent hypotension treated with pressors; O2 sats: currently acceptable Patient's Current Condition: stable Complications: No apparent complications Patient did tolerate procedure well. Chest X-ray ordered to verify placement.  CXR: pending.   Toni Peterson 09/05/2018

## 2018-08-14 NOTE — ED Provider Notes (Signed)
  Provider Note MRN:  [id number]  Arrival date & time: 08/24/2018    ED Course and Medical Decision Making  Assumed care from Dr. Venora Maples at shift change.  History of metastatic cervical cancer on chemotherapy presenting with hypoxic respiratory failure, abdominal distention.  Concern for multifocal pneumonia, COVID swab is negative, currently on 15 L high flow nasal cannula, NG tube in place because she also has concern for small bowel obstruction.  CT scans of the chest and abdomen are pending.  Patient is full code.  3:37 PM update: Upon reassessment patient is with a lower respiratory rate, oxygen saturation 92% on nonrebreather, per nursing and respiratory therapy appears much more comfortable.  We will hold off on intubation given this sign of clinical improvement.  CT reveals no evidence of pulmonary embolism, likely multifocal pneumonia, no acute abdominal findings.  Admitted to intensivist service for further care.  Critical Care Documentation Critical care time provided by me (excluding procedures): 36 minutes  Condition necessitating critical care: Hypoxic respiratory failure  Components of critical care management: reviewing of prior records, laboratory and imaging interpretation, frequent re-examination and reassessment of vital signs, administration of IV fluids, IV antibiotics, discussion with consulting services.    Final Clinical Impressions(s) / ED Diagnoses     ICD-10-CM   1. Acute respiratory failure with hypoxemia (HCC)  J96.01 DG Chest [facility] 1 View    DG Chest Port 1 View  2. Patient has nasogastric tube  Z97.8 DG Abd 1 View    DG Chest 1 View    DG Abd 1 View    DG Chest 1 View  3. Patient has nasogastric tube  Z97.8 DG Abd 1 View    DG Chest 1 View    DG Abd 1 View    DG Chest 1 View    ED Discharge Orders    None       Barth Kirks. [place]onia Small, [place] [email or web address]    Maudie Flakes, MD 08/14/18  [phone]

## 2018-08-14 NOTE — Telephone Encounter (Signed)
-----   Message from Heath Lark, MD sent at 08/25/2018  7:47 AM EDT ----- Regarding: urgent message I saw her son sent a message Please remind him sending my chart message to address "excruciating pain" is not appropriate because it will not be answered in more than 48 hours I have 2 new patients and 18 total I cannot work her in I suggest either go to the ER or have Lucianne Lei see her

## 2018-08-14 NOTE — ED Notes (Signed)
Plan to intubate d/t pt unable to maintain airway, breahting labored, RT at bedside- Hospitalist at bedside 50 mcg  Fentanyl and Versed 2mg  given at 1902, pt lifted up in bed and laid flat, 10 mg ectomidate and 60 mg roc given at 1904. 1906 color change , symmetric chest rise and fall, bilat breath sounds noted, xray in route. LR bolous started at 1906 d/t hypotension

## 2018-08-14 NOTE — ED Notes (Signed)
NG tube suction placed on hold and patient has been transported to CT.

## 2018-08-14 NOTE — ED Provider Notes (Signed)
[facility] DEPT Provider Note   CSN: [id number] Arrival date & time: 09/08/2018  1119     History   Chief Complaint Chief Complaint  Patient presents with   Shortness of Breath   Weakness    HPI Toni Peterson is a 68 y.o. female.     HPI Patient is a 68 year old female with a history of cervical cancer and colon cancer currently receiving chemotherapy who presents to the emergency department with reports of abdominal pain as well as recent nausea vomiting and generalized weakness.  She has nearly short of breath.  She presents denies a fever to 101.2.  She was seen in the emergency department on August 10, 2022 weakness, abdominal pain, lightheadedness with nausea.  She is hypoxic on arrival to the emergency department with O2 sats of 76% on room air.  She is not on home oxygen.   Past Medical History:  Diagnosis Date   Adenoma of left adrenal gland 2019   Benign left adrenal gland adenoma   Cervical cancer ([place])    Colon cancer ([place])    Diabetes ([place])    no meds-diet control   Diverticulosis 01/2018   Sigmoid, Noted on colonoscopy   Family history of breast cancer    Grade II diastolic dysfunction 40/98/1191   Noted on ECHO   Hemorrhoids 01/2018   History of cardiomegaly 2003   noted on CXR   Hyperlipidemia    Hypertension    Hypertrophy of inter-atrial septum 12/20/2017   Noted on ECHO   Multiple thyroid nodules 05/2017   Other abnormal glucose    PONV (postoperative nausea and vomiting)    slow awakening after dental visit, pain after tandem removal 02-28-18   PONV (postoperative nausea and vomiting) 02/28/2018   pt told would get scopolamine patch for next tandem 03-07-18 for nausea/vomiting    Patient Active Problem List   Diagnosis Date Noted   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   Cancer associated pain 05/30/2018   Insomnia due to medical condition 05/30/2018   Other constipation 05/18/2018   Genetic  testing 04/11/2018   UTI (urinary tract infection) 03/24/2018   Family history of breast cancer    Hemorrhoids 02/07/2018   Anemia due to antineoplastic chemotherapy 01/24/2018   Colon cancer ([place]) 01/13/2018   Goals of care, counseling/discussion 12/28/2017   Metastasis to bone ([place]) 12/28/2017   Lesion of colon 12/28/2017   Cervical cancer ([place]) 12/02/2017   Obesity, unspecified 11/10/2012   Hypertension    PONV (postoperative nausea and vomiting)    Hyperlipidemia    Other abnormal glucose     Past Surgical History:  Procedure Laterality Date   ADENOIDECTOMY     COLONOSCOPY  2013   said it was in [place]    COLONOSCOPY  01/2018   DILATION AND CURETTAGE OF UTERUS     DILATION AND CURETTAGE OF UTERUS  03/11/2011   Procedure: DILATATION AND CURETTAGE;  Surgeon: Darlyn Chamber, MD;  Location: [facility];  Service: Gynecology;  Laterality: N/A;   HYSTEROSCOPY W/D&C  09-02-2006   ENDOMETRIAL BX'S   HYSTEROSCOPY WITH RESECTOSCOPE  03/11/2011   Procedure: HYSTEROSCOPY WITH RESECTOSCOPE;  Surgeon: Darlyn Chamber, MD;  Location: [facility];  Service: Gynecology;  Laterality: N/A;   IR FLUORO GUIDED NEEDLE PLC ASPIRATION/INJECTION LOC  12/21/2017   IR IMAGING GUIDED PORT INSERTION  12/23/2017   OPERATIVE ULTRASOUND N/A 03/20/2018   Procedure: OPERATIVE ULTRASOUND;  Surgeon: Gery Pray, MD;  Location: [facility];  Service: Gynecology;  Laterality: N/A;   OPERATIVE ULTRASOUND N/A 03/27/2018   Procedure: OPERATIVE ULTRASOUND;  Surgeon: Gery Pray, MD;  Location: [facility];  Service: Gynecology;  Laterality: N/A;   TANDEM RING INSERTION N/A 02/28/2018   Procedure: TANDEM RING INSERTION;  Surgeon: Gery Pray, MD;  Location[facility];  Service: Urology;  Laterality: N/A;   TANDEM RING INSERTION N/A 03/07/2018   Procedure: TANDEM RING INSERTION;  Surgeon: Gery Pray, MD;   Location: [facility];  Service: Urology;  Laterality: N/A;   TANDEM RING INSERTION N/A 03/16/2018   Procedure: TANDEM RING INSERTION FOR HIGH DOSE RATE RADIATION THERAPY WITH ULTRASOUND;  Surgeon: Gery Pray, MD;  Location: [facility];  Service: Gynecology;  Laterality: N/A;   TANDEM RING INSERTION N/A 03/20/2018   Procedure: TANDEM RING INSERTION;  Surgeon: Gery Pray, MD;  Location: [facility];  Service: Gynecology;  Laterality: N/A;   TANDEM RING INSERTION N/A 03/27/2018   Procedure: TANDEM RING INSERTION;  Surgeon: Gery Pray, MD;  Location: [facility];  Service: Gynecology;  Laterality: N/A;     OB History   No obstetric history on file.      Home Medications    Prior to Admission medications   Medication Sig Start Date End Date Taking? Authorizing Provider  amLODipine (NORVASC) 10 MG tablet Take 10 mg by mouth every evening.  09/01/17   [provider]  atenolol (TENORMIN) 50 MG tablet Take 50 mg by mouth every evening.  07/25/17   [provider]  Cholecalciferol (VITAMIN D3) 5000 units CAPS Take 5,000 Units by mouth daily.     [provider]  dexamethasone (DECADRON) 4 MG tablet Take 1 tablet (4 mg total) by mouth 2 (two) times daily. 07/13/18   Heath Lark, MD  enalapril (VASOTEC) 20 MG tablet Take 20 mg by mouth every evening.  05/11/18   [provider]  fish oil-omega-3 fatty acids 1000 MG capsule Take 1 g by mouth daily.     [provider]  hydrocortisone (ANUSOL-HC) 2.5 % rectal cream Apply 1 application topically 2 (two) times daily. Patient not taking: Reported on 08/10/2018 02/14/18   Gery Pray, MD  HYDROmorphone (DILAUDID) 4 MG tablet Take 1 tablet (4 mg total) by mouth every 4 (four) hours as needed for severe pain. 08/02/18   Heath Lark, MD  lidocaine-prilocaine (EMLA) cream Apply to affected area once 12/28/17   Tish Men, MD  lidocaine-prilocaine (EMLA)  cream Apply to affected area once Patient not taking: Reported on 08/10/2018 05/18/18   Heath Lark, MD  mirtazapine (REMERON) 30 MG tablet TAKE 1 TABLET (30 MG TOTAL) BY MOUTH AT BEDTIME. 06/24/18   Heath Lark, MD  ondansetron (ZOFRAN) 8 MG tablet Take 1 tablet (8 mg total) by mouth every 8 (eight) hours as needed. 08/02/18   Heath Lark, MD  pramoxine (PROCTOFOAM) 1 % foam Place 1 application rectally 3 (three) times daily as needed for anal itching. Patient not taking: Reported on 08/10/2018 02/09/18   Harle Stanford., PA-C  prochlorperazine (COMPAZINE) 10 MG tablet Take 1 tablet (10 mg total) by mouth every 6 (six) hours as needed (Nausea or vomiting). 05/18/18   Heath Lark, MD  spironolactone (ALDACTONE) 25 MG tablet Take 25 mg by mouth daily. 07/26/18   [provider]    Family History Family History  Problem Relation Age of Onset   Stroke Brother  Died 44   Heart attack Mother        Vague   Hypertension Mother    Hyperlipidemia Other    Hypertension Other    Hypertension Father    Breast cancer Paternal Aunt 75   Breast cancer Cousin 56   Colon cancer Neg Hx    Esophageal cancer Neg Hx    Rectal cancer Neg Hx    Stomach cancer Neg Hx     Social History Social History   Tobacco Use   Smoking status: Former Smoker   Smokeless tobacco: Never Used   Tobacco comment: quit 20 years ago  Substance Use Topics   Alcohol use: No   Drug use: No     Allergies   Patient has no known allergies.   Review of Systems Review of Systems  All other systems reviewed and are negative.    Physical Exam Updated Vital Signs BP 124/70    Pulse 77    Temp (!) 101.2 F (38.4 C) (Rectal)    Resp (!) 26    Ht 5\' 1"  (1.549 m)    Wt 68 kg    SpO2 92%    BMI 28.34 kg/m   Physical Exam Vitals signs and nursing note reviewed.  Constitutional:      General: She is in acute distress.     Appearance: She is well-developed. She is ill-appearing and toxic-appearing.   HENT:     Head: Normocephalic and atraumatic.  Neck:     Musculoskeletal: Normal range of motion.  Cardiovascular:     Rate and Rhythm: Normal rate and regular rhythm.     Heart sounds: Normal heart sounds.  Pulmonary:     Effort: Pulmonary effort is normal. No respiratory distress.     Breath sounds: Rhonchi present. No wheezing or rales.  Abdominal:     General: There is no distension.     Palpations: Abdomen is soft. There is no mass.     Tenderness: There is abdominal tenderness. There is no guarding or rebound.  Musculoskeletal: Normal range of motion.        General: No tenderness.  Skin:    General: Skin is warm and dry.     Coloration: Skin is not jaundiced.  Neurological:     General: No focal deficit present.     Mental Status: She is alert and oriented to person, place, and time.  Psychiatric:        Mood and Affect: Mood normal.      ED Treatments / Results  Labs (all labs ordered are listed, but only abnormal results are displayed) Labs Reviewed  CBC - Abnormal; Notable for the following components:      Result Value   WBC 1.5 (*)    RBC 3.34 (*)    Hemoglobin 9.5 (*)    HCT 29.2 (*)    RDW 22.9 (*)    Platelets 32 (*)    All other components within normal limits  LACTIC ACID, PLASMA - Abnormal; Notable for the following components:   Lactic Acid, Venous 2.7 (*)    All other components within normal limits  COMPREHENSIVE METABOLIC PANEL - Abnormal; Notable for the following components:   Sodium 132 (*)    Potassium 6.5 (*)    CO2 21 (*)    Glucose, Bld 118 (*)    BUN 41 (*)    Creatinine, Ser 1.24 (*)    Calcium 7.4 (*)    Total Protein 4.7 (*)  Albumin 1.4 (*)    Total Bilirubin 1.3 (*)    GFR calc non Af Amer 45 (*)    GFR calc Af Amer 52 (*)    All other components within normal limits  SARS CORONAVIRUS 2 (HOSPITAL ORDER, [facility])  CULTURE, BLOOD (ROUTINE X 2)  CULTURE, BLOOD (ROUTINE X 2)  CBC WITH  DIFFERENTIAL/PLATELET  CBC WITH DIFFERENTIAL/PLATELET  POTASSIUM    EKG None  Radiology Dg Chest Portable 1 View  Result Date: 08/26/2018 CLINICAL DATA:  Shortness of breath, abdominal pain, nausea. History of cervical cancer. EXAM: PORTABLE CHEST 1 VIEW COMPARISON:  Chest x-ray dated 01/11/2011. PET-CT dated 07/14/2018. FINDINGS: Patchy ill-defined opacities throughout both lungs, new compared to the recent PET-CT, indicating multifocal pneumonia, asymmetric pulmonary edema or rapidly developing metastases. No pneumothorax seen. Upper mediastinal appears slightly more prominent raising the possibility of developing lymphadenopathy. Diffuse osseous metastases better demonstrated on earlier PET-CT. IMPRESSION: 1. Patchy ill-defined opacities throughout both lungs, new compared to the recent PET-CT, compatible with multifocal pneumonia and/or asymmetric pulmonary edema versus rapidly developing metastases versus some combination of these differential possibilities. 2. Upper mediastinal appears slightly more prominent, raising the possibility of developing lymphadenopathy. Electronically Signed   By: Franki Cabot M.D.   On: 08/25/2018 13:19   Dg Abd Portable 1 View  Result Date: 08/19/2018 CLINICAL DATA:  Abdominal pain and nausea EXAM: PORTABLE ABDOMEN - 1 VIEW COMPARISON:  None. FINDINGS: Mildly prominent gas-filled loops of small bowel within the central abdomen, measuring up to 3.6 cm diameter. No evidence of soft tissue mass or abnormal fluid collections seen. No evidence of free intraperitoneal air seen. Small calcifications within the lower pelvis are favored to be phleboliths. Calcifications in the RIGHT lower quadrant are of uncertain etiology, appendicoliths not excluded. IMPRESSION: 1. Mildly prominent gas-filled loops of small bowel within the central abdomen, measuring up to 3.6 cm diameter, compatible with ileus versus partial small-bowel obstruction. 2. Indeterminate calcifications within  the RIGHT lower quadrant. Appendicoliths are not excluded. If symptoms could indicate appendicitis with associated small-bowel ileus, would consider CT abdomen and pelvis for further characterization. Electronically Signed   By: Franki Cabot M.D.   On: 08/18/2018 13:15    Procedures .Critical Care Performed by: Jola Schmidt, MD Authorized by: Jola Schmidt, MD   Critical care provider statement:    Critical care time (minutes):  40   Critical care was necessary to treat or prevent imminent or life-threatening deterioration of the following conditions:  Respiratory failure   Critical care was time spent personally by me on the following activities:  Discussions with consultants, evaluation of patient's response to treatment, examination of patient, ordering and performing treatments and interventions, ordering and review of laboratory studies, ordering and review of radiographic studies, pulse oximetry, re-evaluation of patient's condition, obtaining history from patient or surrogate and review of old charts NG placement Performed by: Jola Schmidt, MD Authorized by: Jola Schmidt, MD  Consent: Verbal consent obtained. Time out: Immediately prior to procedure a "time out" was called to verify the correct patient, procedure, equipment, support staff and site/side marked as required. Preparation: Patient was prepped and draped in the usual sterile fashion. Local anesthesia used: no  Anesthesia: Local anesthesia used: no  Sedation: Patient sedated: no  Patient tolerance: patient tolerated the procedure well with no immediate complications Comments: Unable to tolerate NG tube placement by Nursing Staff    (including critical care time)  Medications Ordered in ED Medications  vancomycin (VANCOCIN)  IVPB 1000 mg/200 mL premix (has no administration in time range)  sodium chloride 0.9 % bolus 1,000 mL (1,000 mLs Intravenous New Bag/Given 08/22/2018 1215)  morphine 4 MG/ML injection 4 mg (4  mg Intravenous Given 08/12/2018 1324)  ceFEPIme (MAXIPIME) 2 g in sodium chloride 0.9 % 100 mL IVPB (2 g Intravenous New Bag/Given 09/08/2018 1359)     Initial Impression / Assessment and Plan / ED Course  I have reviewed the triage vital signs and the nursing notes.  Pertinent labs & imaging results that were available during my care of the patient were reviewed by me and considered in my medical decision making (see chart for details).        Patient with new hypoxia.  Bilateral infiltrates present on chest x-ray.  Requiring 15 L nonrebreather mask.  Looks much better on oxygen.  Still with abdominal pain at this time.  Patient will undergo CT imaging of the chest as well as abdomen and pelvis.  Questionable small bowel obstruction on plain film.  NG tube returned approximately 200 cc of bilious material.  CT scan pending at this time.  Patient is a full code.  She does request ventilator support and CPR if necessary.  At this time we will attempt to manage her on nonrebreather mask.  She may benefit from BiPAP.  Pulmonary embolism is still in the differential given her history of active cancer however with fever to 101.2 and evidence of pancytopenia this is probably developing neutropenic fever.  Broad-spectrum antibiotics.  Pneumonia is the likely source.  Blood cultures obtained.  Patient is acutely ill.  She will require at least placement in the stepdown unit if not the intensive care unit depending on her oxygen needs.  Patient initially not tolerating NG tube placement by nursing staff and I was able to place this successfully.  Patient is negative for COVID-19.  Care to Dr Sedonia Small at 3:27 PM   Final Clinical Impressions(s) / ED Diagnoses   Final diagnoses:  None    ED Discharge Orders    None       Jola Schmidt, MD 09/07/2018 1527

## 2018-08-14 NOTE — ED Notes (Signed)
Patient transported to CT 

## 2018-08-14 NOTE — ED Triage Notes (Signed)
Pt to ED via EMS from home d/t [facility] and weakness since last chemo treatment last Wednesday. Hx: cervical cancer. Alert & Oriented x 4. Pt on nonrebreather.  Pt reports to her knowledge she has not been around anyone with Covid. Pt denies fever, cough. #20 RAC, 250ML NS bolus given by EMS.

## 2018-08-14 NOTE — ED Notes (Signed)
Date and time results received: 08/19/2018    Test: potassium   Critical Value: 6.5  Name of Provider Notified: Dr. Venora Maples  Orders Received? Or Actions Taken?: Actions Taken: awaiting orders

## 2018-08-14 NOTE — Telephone Encounter (Signed)
Daughter called and left a message that they need to cancel appt with Sandi Mealy, PA for today.   Called back. They called 911 and are going to the [facility] ER. Her oxygen saturation was 69% and she was unable to move her legs. Appts canceled.  Daughter is asking if Dr. Alvy Bimler can call her later with update.

## 2018-08-14 NOTE — Progress Notes (Signed)
[facility] Progress Note Patient Name: Toni Peterson DOB: 07/16/1950 MRN: [id number]   Date of Service  08/31/2018  HPI/Events of Note  ABG on 100%/PRVC 24/TV 380/P 12 = 7.368/33.7/65.7. Sat on bedside monitor now = 94%  eICU Interventions  Will order: 1. D-Dimer now.      Intervention Category Major Interventions: Hypoxemia - evaluation and management  Sommer,Steven Eugene 08/26/2018, 10:47 PM

## 2018-08-14 NOTE — Telephone Encounter (Signed)
Daughter, [name] called and left a message. They are waiting on update on her Mom. She has called several times to the ER.  Called ER charge nurse and ask if someone could call the daughter. She will pass the message on.  Just FYI!

## 2018-08-14 NOTE — ED Notes (Signed)
EDP at bedside  

## 2018-08-14 NOTE — ED Notes (Signed)
Date and time results received: 08/26/2018  (use smartphrase ".now" to insert current time)  Test: Lactic Acid Critical Value: 2.7  Name of Provider Notified: [place]

## 2018-08-14 NOTE — Progress Notes (Signed)
[facility] Progress Note Patient Name: Toni Peterson DOB: 03-03-1950 MRN: [id number]   Date of Service  08/11/2018  HPI/Events of Note  Lactic Acid = 3.6 - LVEF = 60=65%  eICU Interventions  Will order: 1. Bolus with 0.9 NaCl 1 liter IV over 1 hour now.      Intervention Category Major Interventions: Acid-Base disturbance - evaluation and management  Sommer,Steven Cornelia Copa 09/08/2018, 9:39 PM

## 2018-08-14 NOTE — Progress Notes (Signed)
Pharmacy Antibiotic Note  Toni Peterson is a 68 y.o. female admitted on 08/13/2018 with pneumonia.  Pharmacy has been consulted for vanc/cefepime dosing.  Plan: 1) Vanc 1g x 1 then 750mg  IV q24 - goal AUC 400-550 2) Cefepime 2g IV q12 for CrCl 39  Height: 5\' 1"  (154.9 cm) Weight: 150 lb (68 kg) IBW/kg (Calculated) : 47.8  Temp (24hrs), Avg:99 F (37.2 C), Min:97.4 F (36.3 C), Max:101.2 F (38.4 C)  Recent Labs  Lab 08/09/18 0905 08/10/18 1418 08/10/18 1602 08/19/2018 1158 08/29/2018 1159 08/22/2018 1330  WBC 14.2* 12.5*  --  1.5*  --   --   CREATININE 1.39* 1.17*  --   --   --  1.24*  LATICACIDVEN  --  1.8 1.2  --  2.7*  --     Estimated Creatinine Clearance: 39.4 mL/min (A) (by C-G formula based on SCr of 1.24 mg/dL (H)).    No Known Allergies  Thank you for allowing pharmacy to be a part of this patient's care.  Kara Mead 08/23/2018 6:56 PM

## 2018-08-14 NOTE — Telephone Encounter (Signed)
Called and given below message to Toni Peterson and her daughter. They verbalized understanding.  They would like appt with Sandi Mealy, PA and not go to ER. Appt scheduled.  My chart message. "This is Toni Peterson . She has been in Excruciating pain since Thursday . Taking 2-3 hydro morphine every 4 hrs  but the medicine isn't working . She fainted  Thursday which prompted the visit to the ER. She has also began to urinate on herself Thursday  and has to wear briefs. Noticed feet were swollen Sunday 7/5. Not able to eat or tolerate much by mouth. Very weak can't walk more than a few steps . Relying heavenly on a drive walker  to sit up."

## 2018-08-14 NOTE — ED Notes (Signed)
Date and time results received: 09/01/2018 (use smartphrase ".now" to insert current time)  Test: K Critical Value: 6.5  Name of Provider Notified: [place]

## 2018-08-14 NOTE — Progress Notes (Signed)
[facility] Progress Note Patient Name: Toni Peterson DOB: 04-12-1950 MRN: [id number]   Date of Service  08/25/2018  HPI/Events of Note  D-Dimer = 2.01. However, Chest CTA was negative for PE earlier today. I suspect that hypoxia is driven by metastatic disease and +/- pneumonia.   eICU Interventions  Will continue present management.      Intervention Category Major Interventions: Hypoxemia - evaluation and management  Sommer,Steven Eugene 08/23/2018, 11:33 PM

## 2018-08-14 NOTE — ED Notes (Signed)
Pt c/o pain, this nurse notified EDP

## 2018-08-15 ENCOUNTER — Inpatient Hospital Stay (HOSPITAL_COMMUNITY): Payer: BC Managed Care – PPO

## 2018-08-15 DIAGNOSIS — J80 Acute respiratory distress syndrome: Secondary | ICD-10-CM

## 2018-08-15 DIAGNOSIS — T451X5A Adverse effect of antineoplastic and immunosuppressive drugs, initial encounter: Secondary | ICD-10-CM

## 2018-08-15 DIAGNOSIS — R5081 Fever presenting with conditions classified elsewhere: Secondary | ICD-10-CM

## 2018-08-15 DIAGNOSIS — D709 Neutropenia, unspecified: Secondary | ICD-10-CM

## 2018-08-15 DIAGNOSIS — D61810 Antineoplastic chemotherapy induced pancytopenia: Secondary | ICD-10-CM

## 2018-08-15 LAB — BASIC METABOLIC PANEL
Anion gap: 11 (ref 5–15)
BUN: 46 mg/dL — ABNORMAL HIGH (ref 8–23)
CO2: 17 mmol/L — ABNORMAL LOW (ref 22–32)
Calcium: 7.3 mg/dL — ABNORMAL LOW (ref 8.9–10.3)
Chloride: 104 mmol/L (ref 98–111)
Creatinine, Ser: 1.3 mg/dL — ABNORMAL HIGH (ref 0.44–1.00)
GFR calc Af Amer: 50 mL/min — ABNORMAL LOW (ref >60)
GFR calc non Af Amer: 43 mL/min — ABNORMAL LOW (ref >60)
Glucose, Bld: 102 mg/dL — ABNORMAL HIGH (ref 70–99)
Potassium: 5.5 mmol/L — ABNORMAL HIGH (ref 3.5–5.1)
Sodium: 132 mmol/L — ABNORMAL LOW (ref 135–145)

## 2018-08-15 LAB — CBC WITH DIFFERENTIAL/PLATELET
Abs Immature Granulocytes: 0 10*3/uL (ref 0.00–0.07)
Abs Immature Granulocytes: 0.01 10*3/uL (ref 0.00–0.07)
Basophils Absolute: 0 10*3/uL (ref 0.0–0.1)
Basophils Absolute: 0 10*3/uL (ref 0.0–0.1)
Basophils Relative: 1 %
Basophils Relative: 1 %
Eosinophils Absolute: 0 10*3/uL (ref 0.0–0.5)
Eosinophils Absolute: 0 10*3/uL (ref 0.0–0.5)
Eosinophils Relative: 0 %
Eosinophils Relative: 0 %
HCT: 22.8 % — ABNORMAL LOW (ref 36.0–46.0)
HCT: 26.1 % — ABNORMAL LOW (ref 36.0–46.0)
Hemoglobin: 7.1 g/dL — ABNORMAL LOW (ref 12.0–15.0)
Hemoglobin: 7.7 g/dL — ABNORMAL LOW (ref 12.0–15.0)
Immature Granulocytes: 0 %
Immature Granulocytes: 1 %
Lymphocytes Relative: 3 %
Lymphocytes Relative: 5 %
Lymphs Abs: 0 10*3/uL — ABNORMAL LOW (ref 0.7–4.0)
Lymphs Abs: 0.1 10*3/uL — ABNORMAL LOW (ref 0.7–4.0)
MCH: 27.7 pg (ref 26.0–34.0)
MCH: 28.5 pg (ref 26.0–34.0)
MCHC: 31.1 g/dL (ref 30.0–36.0)
MCHC: 29.5 g/dL — ABNORMAL LOW (ref 30.0–36.0)
MCV: 89.1 fL (ref 80.0–100.0)
MCV: 96.7 fL (ref 80.0–100.0)
Monocytes Absolute: 0 10*3/uL — ABNORMAL LOW (ref 0.1–1.0)
Monocytes Absolute: 0 10*3/uL — ABNORMAL LOW (ref 0.1–1.0)
Monocytes Relative: 3 %
Monocytes Relative: 3 %
Neutro Abs: 1.1 10*3/uL — ABNORMAL LOW (ref 1.7–7.7)
Neutro Abs: 1.1 10*3/uL — ABNORMAL LOW (ref 1.7–7.7)
Neutrophils Relative %: 93 %
Neutrophils Relative %: 90 %
Platelets: 7 10*3/uL — CL (ref 150–400)
Platelets: <5 10*3/uL — CL (ref 150–400)
RBC: 2.56 MIL/uL — ABNORMAL LOW (ref 3.87–5.11)
RBC: 2.7 MIL/uL — ABNORMAL LOW (ref 3.87–5.11)
RDW: 23 % — ABNORMAL HIGH (ref 11.5–15.5)
RDW: 23.8 % — ABNORMAL HIGH (ref 11.5–15.5)
WBC: 1.1 10*3/uL — CL (ref 4.0–10.5)
WBC: 1.2 10*3/uL — CL (ref 4.0–10.5)
nRBC: 2.7 % — ABNORMAL HIGH (ref 0.0–0.2)
nRBC: 4 % — ABNORMAL HIGH (ref 0.0–0.2)

## 2018-08-15 LAB — TRIGLYCERIDES: Triglycerides: 140 mg/dL (ref <150)

## 2018-08-15 LAB — BLOOD GAS, ARTERIAL
Acid-base deficit: 9.2 mmol/L — ABNORMAL HIGH (ref 0.0–2.0)
Bicarbonate: 16.4 mmol/L — ABNORMAL LOW (ref 20.0–28.0)
Drawn by: 232811
FIO2: 100
MECHVT: 380 mL
O2 Saturation: 77.9 %
PEEP: 16 cmH2O
Patient temperature: 100.9
RATE: 24 resp/min
pCO2 arterial: 39.4 mmHg (ref 32.0–48.0)
pH, Arterial: 7.251 — ABNORMAL LOW (ref 7.350–7.450)
pO2, Arterial: 57.9 mmHg — ABNORMAL LOW (ref 83.0–108.0)

## 2018-08-15 LAB — GLUCOSE, CAPILLARY
Glucose-Capillary: 95 mg/dL (ref 70–99)
Glucose-Capillary: 82 mg/dL (ref 70–99)

## 2018-08-15 LAB — BLOOD CULTURE ID PANEL (REFLEXED)

## 2018-08-15 LAB — ABO/RH: ABO/RH(D): A POS

## 2018-08-15 LAB — PHOSPHORUS: Phosphorus: 5.3 mg/dL — ABNORMAL HIGH (ref 2.5–4.6)

## 2018-08-15 LAB — TYPE AND SCREEN
ABO/RH(D): A POS
Antibody Screen: NEGATIVE

## 2018-08-15 LAB — STREP PNEUMONIAE URINARY ANTIGEN: Strep Pneumo Urinary Antigen: NEGATIVE

## 2018-08-15 LAB — MAGNESIUM: Magnesium: 2.5 mg/dL — ABNORMAL HIGH (ref 1.7–2.4)

## 2018-08-15 LAB — CORTISOL: Cortisol, Plasma: 74.8 ug/dL

## 2018-08-15 MED ORDER — GLYCOPYRROLATE 0.2 MG/ML IJ SOLN: 0.2000 mg | INTRAMUSCULAR | Status: DC | PRN

## 2018-08-15 MED ORDER — VECURONIUM BROMIDE 10 MG IV SOLR
0.0000 ug/kg/min | INTRAVENOUS | Status: DC
  Administered 2018-08-15: 1 ug/kg/min via INTRAVENOUS
  Filled 2018-08-15: qty 100

## 2018-08-15 MED ORDER — VECURONIUM BOLUS VIA INFUSION
7.0000 mg | Freq: Once | INTRAVENOUS | Status: AC
  Administered 2018-08-15: 03:00:00 7 mg via INTRAVENOUS
  Filled 2018-08-15: qty 7

## 2018-08-15 MED ORDER — FUROSEMIDE 10 MG/ML IJ SOLN
40.0000 mg | Freq: Once | INTRAMUSCULAR | Status: AC
  Administered 2018-08-15: 40 mg via INTRAVENOUS
  Filled 2018-08-15: qty 4

## 2018-08-15 MED ORDER — DIPHENHYDRAMINE HCL 50 MG/ML IJ SOLN: 25.0000 mg | INTRAMUSCULAR | Status: DC | PRN

## 2018-08-15 MED ORDER — MIDAZOLAM HCL 2 MG/2ML IJ SOLN: 2.0000 mg | INTRAMUSCULAR | Status: DC | PRN

## 2018-08-15 MED ORDER — FENTANYL 2500MCG IN NS 250ML (10MCG/ML) PREMIX INFUSION: 0.0000 ug/h | INTRAVENOUS | Status: DC

## 2018-08-15 MED ORDER — PROPOFOL 1000 MG/100ML IV EMUL
25.0000 ug/kg/min | INTRAVENOUS | Status: DC
  Administered 2018-08-15: 03:00:00 25 ug/kg/min via INTRAVENOUS
  Filled 2018-08-15: qty 100

## 2018-08-15 MED ORDER — PANTOPRAZOLE SODIUM 40 MG PO PACK: 40.0000 mg | PACK | ORAL | Status: DC

## 2018-08-15 MED ORDER — GLYCOPYRROLATE 1 MG PO TABS: 1.0000 mg | ORAL_TABLET | ORAL | Status: DC | PRN

## 2018-08-15 MED ORDER — NOREPINEPHRINE 4 MG/250ML-% IV SOLN
0.0000 ug/min | INTRAVENOUS | Status: DC
  Filled 2018-08-15: qty 250

## 2018-08-15 MED ORDER — ARTIFICIAL TEARS OPHTHALMIC OINT
1.0000 "application " | TOPICAL_OINTMENT | Freq: Three times a day (TID) | OPHTHALMIC | Status: DC
  Administered 2018-08-15: 1 via OPHTHALMIC
  Filled 2018-08-15: qty 3.5

## 2018-08-15 MED ORDER — DEXTROSE 5 % IV SOLN: INTRAVENOUS | Status: DC

## 2018-08-15 MED ORDER — FENTANYL BOLUS VIA INFUSION
25.0000 ug | INTRAVENOUS | Status: DC | PRN
  Filled 2018-08-15: qty 25

## 2018-08-15 MED ORDER — FENTANYL CITRATE (PF) 100 MCG/2ML IJ SOLN
25.0000 ug | Freq: Once | INTRAMUSCULAR | Status: AC
  Administered 2018-08-15: 03:00:00 25 ug via INTRAVENOUS
  Filled 2018-08-15: qty 2

## 2018-08-15 MED ORDER — SODIUM CHLORIDE 0.9% IV SOLUTION: Freq: Once | INTRAVENOUS | Status: DC

## 2018-08-15 MED ORDER — NOREPINEPHRINE 16 MG/250ML-% IV SOLN
0.0000 ug/min | INTRAVENOUS | Status: DC
  Filled 2018-08-15: qty 250

## 2018-08-15 MED ORDER — TBO-FILGRASTIM 300 MCG/0.5ML ~~LOC~~ SOSY
300.0000 ug | PREFILLED_SYRINGE | Freq: Every day | SUBCUTANEOUS | Status: DC
  Administered 2018-08-15: 07:00:00 300 ug via SUBCUTANEOUS
  Filled 2018-08-15: qty 0.5

## 2018-08-15 MED ORDER — FENTANYL BOLUS VIA INFUSION
100.0000 ug | INTRAVENOUS | Status: DC | PRN
  Filled 2018-08-15: qty 100

## 2018-08-15 MED ORDER — VITAL HIGH PROTEIN PO LIQD: 1000.0000 mL | ORAL | Status: DC

## 2018-08-15 MED ORDER — FENTANYL 2500MCG IN NS 250ML (10MCG/ML) PREMIX INFUSION
25.0000 ug/h | INTRAVENOUS | Status: DC
  Administered 2018-08-15: 11:00:00 100 ug/h via INTRAVENOUS
  Filled 2018-08-15: qty 250

## 2018-08-15 MED ORDER — FENTANYL CITRATE (PF) 100 MCG/2ML IJ SOLN: 50.0000 ug | INTRAMUSCULAR | Status: DC | PRN

## 2018-08-15 MED ORDER — POLYVINYL ALCOHOL 1.4 % OP SOLN
1.0000 [drp] | Freq: Four times a day (QID) | OPHTHALMIC | Status: DC | PRN
  Filled 2018-08-15: qty 15

## 2018-08-16 ENCOUNTER — Ambulatory Visit: Payer: BC Managed Care – PPO | Admitting: Hematology and Oncology

## 2018-08-16 ENCOUNTER — Ambulatory Visit: Payer: BC Managed Care – PPO

## 2018-08-16 ENCOUNTER — Other Ambulatory Visit: Payer: BC Managed Care – PPO

## 2018-08-16 LAB — PREPARE PLATELET PHERESIS: Unit division: 0

## 2018-08-16 LAB — BPAM PLATELET PHERESIS
Blood Product Expiration Date: 202007092359
ISSUE DATE / TIME: 202007070839
Unit Type and Rh: 6200

## 2018-08-16 LAB — CULTURE, BLOOD (ROUTINE X 2): Special Requests: ADEQUATE

## 2018-08-17 LAB — LEGIONELLA PNEUMOPHILA SEROGP 1 UR AG: L. pneumophila Serogp 1 Ur Ag: NEGATIVE

## 2018-08-19 LAB — CULTURE, BLOOD (ROUTINE X 2): Culture: NO GROWTH

## 2018-08-23 ENCOUNTER — Other Ambulatory Visit: Payer: BC Managed Care – PPO

## 2018-08-23 ENCOUNTER — Ambulatory Visit: Payer: BC Managed Care – PPO

## 2018-08-24 ENCOUNTER — Telehealth: Payer: Self-pay

## 2018-08-24 NOTE — Telephone Encounter (Signed)
Received dc from [place].. DC is for burial and a patient of Doctor Sood.  DC will be taken to Pulmonary Unit for signature.  On 08/25/2018 Received dc back from [facility] [place].  I called the funeral home to let them know the dc is ready for pickup.

## 2018-09-09 NOTE — Death Summary Note (Signed)
68 yr old female with history of metastatic cervical cancer on palliative chemotherapy presented to the ER with fever neutropenia, hypoxia, pulmonary infiltrates from HCAP with septic shock and ARDS.  She was intubated in the ER and started on antibiotics.  She developed refractory shock and worsening hypoxia.  She was started on nimbex.  Oncology was consulted.  After detailed d/w the family, decision was made for DNR status and comfort measures.  She expired on 09-13-2018 at 11:14 AM.  Final diagnoses: Acute hypoxic respiratory failure with ARDS HCAP [place] Streptococcus bacteremia Neutropenic fever Chemotherapy induced pancytopenia Septic shock Relative adrenal insufficiency AKI from ATN Hyperkalemia Metabolic acidosis with lactic acidosis Stage 4 cervical cancer History of colon cancer DM type II Moderate protein calorie malnutrition History of chronic diastolic CHF SARS Coronavirus 2 negative  Chesley Mires, MD [facility] Pulmonary/Critical Care 08/24/2018, 7:47 AM

## 2018-09-09 NOTE — Progress Notes (Signed)
eLink Physician-Brief Progress Note Patient Name: CAMBRIE SONNENFELD DOB: 1950/02/27 MRN: [id number]   Date of Service  08/17/2018  HPI/Events of Note  Remains hypoxic in spite of 100%/P 16.   eICU Interventions  Will order: 1. NMB per PCCM NMB protocol.      Intervention Category Major Interventions: Hypoxemia - evaluation and management  Sommer,Steven Eugene August 17, 2018, 2:31 AM

## 2018-09-09 NOTE — Progress Notes (Addendum)
Spoke with daughter [name]; scheduled family to arrive at [facility] 08-19-18 to proceed with comfort care measures. Family s/w Oncology MD Alvy Bimler and CCM MD Sood this AM per note regarding worsening condition and goals of care were discussed, plan for full DNR and withdrawal with comfort care measures today.

## 2018-09-09 NOTE — Progress Notes (Signed)
[facility]  Toni Peterson was referred to our palliative services in the community.  Prior to engaging for her first visit, she was admitted to the hospital.  We will continue to follow, plan to meet with her once discharged.  Elbert Ewings RN, BSN, CCRN [facility] (in [place]) [phone]

## 2018-09-09 NOTE — Progress Notes (Signed)
eLink Physician-Brief Progress Note Patient Name: Toni Peterson DOB: Mar 07, 1950 MRN: [id number]   Date of Service  2018/08/27  HPI/Events of Note  Hypoxemia - Oximetry remains 80 - 88% on PEEP = 14.   eICU Interventions  Will increase PEEP to 16.      Intervention Category Major Interventions: Hypoxemia - evaluation and management  [name] 08/27/2018, 2:03 AM

## 2018-09-09 NOTE — Progress Notes (Signed)
Spoke with Dr. Halford Chessman concerning patient Vt on 6 cc (Vt:280); patient to remain at 8 cc at this time (Vt:380). RT will continue to monitor patient.

## 2018-09-09 NOTE — Progress Notes (Signed)
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 Her condition is terminal  Severe pancytopenia Neutropenic sepsis She is currently on broad-spectrum IV antibiotics Her worsening pancytopenia is likely related to recent chemotherapy and has severe bone marrow disease Again, her condition is terminal  Goals of care, counseling/discussion I have extensive discussions about goals of care with the patient over the last year I spoke with her daughter Toni Peterson today I reviewed with her blood work, clinical findings and imaging studies Her condition is terminal and not treatable I recommend withdrawal of aggressive care and focus on comfort measures She seems to be in agreement for this approach but would like to visit the patient before her care is withdrawn I will contact primary service to let them know about this I will sign off Please call if questions arise  All questions were answered.    Heath Lark, MD 2018-08-24 8:23 AM  Subjective: The patient is well-known to me.  She has metastatic cervical cancer as well as concurrent colon cancer She just received 1 dose of chemotherapy recently and presented to the emergency department with hypoxia, respiratory failure and sepsis.  She is also found to have bowel obstruction She was intubated overnight and is on broad-spectrum IV antibiotics as well as aggressive maximum life support The patient is currently sedated and ventilated  Objective:  Vitals:   08/24/18 0800 08/24/2018 0812  BP: 116/60   Pulse: 96   Resp: (!) 24   Temp:    SpO2: (!) 88% 90%     Intake/Output Summary (Last 24 hours) at 24-Aug-2018 0823 Last data filed at 24-Aug-2018 0500 Gross per 24 hour  Intake 3074.84 ml   Output 450 ml  Net 2624.84 ml    GENERAL: She is currently ventilated and sedated EYES: Eyes are closed OROPHARYNX: ET tube and NG tube in situ LUNGS: Coarse breath sounds bilaterally  hEART: Tachycardia  ABDOMEN:abdomen mildly distended   Labs:  Recent Labs    08/09/18 0905 08/10/18 1418 08/09/2018 1330 08/11/2018 1410 08/18/2018 2033 24-Aug-2018 0412  NA 130* 130* 132*  --  130* 132*  K 5.3* 4.6 6.5* 6.1* 5.4* 5.5*  CL 97* 100 102  --  101 104  CO2 19* 19* 21*  --  20* 17*  GLUCOSE 125* 221* 118*  --  156* 102*  BUN 44* 43* 41*  --  40* 46*  CREATININE 1.39* 1.17* 1.24*  --  1.13* 1.30*  CALCIUM 9.3 8.2* 7.4*  --  7.8* 7.3*  GFRNONAA 39* 49* 45*  --  51* 43*  GFRAA 46* 56* 52*  --  59* 50*  PROT 6.6 6.2* 4.7*  --   --   --   ALBUMIN 2.0* 2.2* 1.4*  --   --   --   AST 10* 11* 25  --   --   --   ALT 18 16 21   --   --   --   ALKPHOS 170* 143* 116  --   --   --   BILITOT 0.3 0.7 1.3*  --   --   --     Studies: I have personally reviewed imaging studies Dg Chest 1 View  Result Date: August 24, 2018 CLINICAL DATA:  This study identified as missing a report  at 8:10 am on 2018/08/16. 68 year old female intubated. Metastatic cancer. EXAM: CHEST  1 VIEW COMPARISON:  CTA chest 09/02/2018 and earlier. Subsequent portable chest from 2018/08/16 0034 hours. FINDINGS: Portable AP semi upright view at 2153 hours. Intubated with endotracheal tube tip about 10 millimeters above the carina., side hole enteric tube courses to the stomach at the gastric body. Right chest porta catheter is stable. Stable cardiac size and mediastinal contours. Slightly larger lung volumes. Bilateral pulmonary metastases. No pneumothorax or pleural effusion. IMPRESSION: 1. Intubated, ET tube tip about 10 mm above the carina. Enteric tube courses to the stomach. 2. Slightly larger lung volumes. Stable bilateral pulmonary metastases. Electronically Signed   By: Genevie Ann M.D.   On: 08-16-2018 08:14   Dg Abd 1 View  Result Date:  08/16/2018 CLINICAL DATA:  This study identified as missing a report at 8:09 am on 2018/08/16. 68 year old female enteric tube placement. EXAM: ABDOMEN - 1 VIEW COMPARISON:  CT Abdomen and Pelvis 09/05/2018 and earlier. FINDINGS: Portable AP view on 08/25/2018 at 2153 hours. Enteric tube mildly looped in the proximal stomach with side hole at the level of the gastric body. Excreted IV contrast in urinary collecting systems, with left hydronephrosis. Negative visible bowel gas pattern. Partially visible right chest power port and nodular bilateral pulmonary opacity. IMPRESSION: Enteric tube in the stomach with side hole at the level of the gastric body. Electronically Signed   By: Genevie Ann M.D.   On: 2018/08/16 08:10   Ct Angio Chest Pe W And/or Wo Contrast  Result Date: 08/19/2018 CLINICAL DATA:  Shortness of breath, weakness, chemotherapy EXAM: CT ANGIOGRAPHY CHEST WITH CONTRAST TECHNIQUE: Multidetector CT imaging of the chest was performed using the standard protocol during bolus administration of intravenous contrast. Multiplanar CT image reconstructions and MIPs were obtained to evaluate the vascular anatomy. CONTRAST:  172mL OMNIPAQUE IOHEXOL 350 MG/ML SOLN COMPARISON:  PET-CT, 07/14/2018 FINDINGS: Cardiovascular: Satisfactory opacification of the pulmonary arteries to the segmental level. No evidence of pulmonary embolism. Normal heart size. Left coronary artery calcifications. No pericardial effusion. Mediastinum/Nodes: No enlarged mediastinal, hilar, or axillary lymph nodes. Enlarged thyroid with internal calcifications. Trachea and esophagus demonstrate no significant findings. Esophagogastric tube with tip and side port below the diaphragm Lungs/Pleura: Numerous new nodular and masslike pulmonary opacities with ill-defined margins throughout. No pleural effusion or pneumothorax. Upper Abdomen: Please see separately dictated CT examination of the abdomen and pelvis. Musculoskeletal: No chest wall  abnormality. Redemonstrated sclerotic osseous metastatic disease. Review of the MIP images confirms the above findings. IMPRESSION: 1.  Negative examination for pulmonary embolism. 2. Numerous new nodular and masslike pulmonary opacities with ill-defined margins throughout. These presumably represent metastatic disease, however very rapid evolution comparison to PET-CT dated 07/14/2018 is unusual and differential considerations of multifocal infection and organizing pneumonia (including secondary to drug toxicity) remain. 3.  Redemonstrated sclerotic osseous metastatic disease. Electronically Signed   By: Eddie Candle M.D.   On: 08/18/2018 17:37   Ct Abdomen Pelvis W Contrast  Result Date: 08/14/2018 CLINICAL DATA:  Abnormal bowel loops on plain film, rule out bowel obstruction, nausea, vomiting, abdominal pain EXAM: CT ABDOMEN AND PELVIS WITH CONTRAST TECHNIQUE: Multidetector CT imaging of the abdomen and pelvis was performed using the standard protocol following bolus administration of intravenous contrast. CONTRAST:  126mL OMNIPAQUE IOHEXOL 350 MG/ML SOLN COMPARISON:  Same-day abdominal radiographs, PET-CT, 07/14/2018 FINDINGS: Lower chest: Please see separately reported CT examination of the chest. Hepatobiliary: No solid liver abnormality is seen. No gallstones, gallbladder wall  thickening, or biliary dilatation. Pancreas: Unremarkable. No pancreatic ductal dilatation or surrounding inflammatory changes. Spleen: Normal in size without significant abnormality. Adrenals/Urinary Tract: Adrenal glands are unremarkable. Unchanged moderate left hydronephrosis and proximal hydroureter. New mild right hydronephrosis and hydroureter without obvious distal obstructing calculus or lesion. Stomach/Bowel: Stomach is within normal limits. Appendix appears normal. No evidence of bowel wall thickening, distention, or inflammatory changes. Vascular/Lymphatic: Aortic atherosclerosis. No enlarged abdominal or pelvic lymph  nodes. Reproductive: Post curettage and resection appearance of the cervix, with fluid in the endometrial and cervical cavity and vagina. Other: No abdominal wall hernia or abnormality. Small volume ascites. Hazy, nodular appearance of the omentum and peritoneal lining, particularly in the left upper quadrant (series 7, image 34). Musculoskeletal: Redemonstrated sclerotic osseous metastatic disease. IMPRESSION: 1. No definite acute CT abnormality of the abdomen or pelvis to explain pain. 2. Unchanged moderate left hydronephrosis and proximal hydroureter. New mild right hydronephrosis and hydroureter without obvious distal obstructing calculus or lesion. 3. Redemonstrated findings of advanced cervical malignancy, including Post curettage and resection appearance of the cervix, with fluid in the endometrial and cervical cavity and vagina, small volume ascites with hazy, nodular appearance of the omentum and peritoneal lining, likely reflecting peritoneal metastatic disease, and sclerotic osseous metastatic disease. Electronically Signed   By: Eddie Candle M.D.   On: 09/01/2018 17:51   Dg Chest Port 1 View  Result Date: 08/29/2018 CLINICAL DATA:  Endotracheal tube position. Acute respiratory failure. Hypoxemia. EXAM: PORTABLE CHEST 1 VIEW COMPARISON:  Radiograph and chest CT yesterday. FINDINGS: Endotracheal tube tip 18 mm from the carina. Tip and side port of the enteric tube below the diaphragm in the stomach. Right chest port remains in place. Progressive bibasilar airspace opacities since prior exam. Additional multifocal nodular masslike densities throughout both lungs again seen. No pneumothorax. No evidence of pulmonary edema. IMPRESSION: 1. Progression in bibasilar opacities since prior exam which may be progression of airspace disease or development of pleural effusions. 2. Additional multifocal pulmonary opacities are grossly unchanged. Electronically Signed   By: Keith Rake M.D.   On: 08-29-18  01:08   Dg Chest Portable 1 View  Result Date: 09/01/2018 CLINICAL DATA:  Shortness of breath, abdominal pain, nausea. History of cervical cancer. EXAM: PORTABLE CHEST 1 VIEW COMPARISON:  Chest x-ray dated 01/11/2011. PET-CT dated 07/14/2018. FINDINGS: Patchy ill-defined opacities throughout both lungs, new compared to the recent PET-CT, indicating multifocal pneumonia, asymmetric pulmonary edema or rapidly developing metastases. No pneumothorax seen. Upper mediastinal appears slightly more prominent raising the possibility of developing lymphadenopathy. Diffuse osseous metastases better demonstrated on earlier PET-CT. IMPRESSION: 1. Patchy ill-defined opacities throughout both lungs, new compared to the recent PET-CT, compatible with multifocal pneumonia and/or asymmetric pulmonary edema versus rapidly developing metastases versus some combination of these differential possibilities. 2. Upper mediastinal appears slightly more prominent, raising the possibility of developing lymphadenopathy. Electronically Signed   By: Franki Cabot M.D.   On: 08/12/2018 13:19   Dg Abd Portable 1 View  Result Date: 09/07/2018 CLINICAL DATA:  Abdominal pain and nausea EXAM: PORTABLE ABDOMEN - 1 VIEW COMPARISON:  None. FINDINGS: Mildly prominent gas-filled loops of small bowel within the central abdomen, measuring up to 3.6 cm diameter. No evidence of soft tissue mass or abnormal fluid collections seen. No evidence of free intraperitoneal air seen. Small calcifications within the lower pelvis are favored to be phleboliths. Calcifications in the RIGHT lower quadrant are of uncertain etiology, appendicoliths not excluded. IMPRESSION: 1. Mildly prominent gas-filled loops of small bowel  within the central abdomen, measuring up to 3.6 cm diameter, compatible with ileus versus partial small-bowel obstruction. 2. Indeterminate calcifications within the RIGHT lower quadrant. Appendicoliths are not excluded. If symptoms could indicate  appendicitis with associated small-bowel ileus, would consider CT abdomen and pelvis for further characterization. Electronically Signed   By: Franki Cabot M.D.   On: 08/13/2018 13:15

## 2018-09-09 NOTE — Progress Notes (Signed)
Called Daughter Toni Peterson to request family come to the hospital as soon as possible due to change in blood pressure and vital signs after Vecuronium was turned off, verbalized understanding and reports they plan to get to the hospital within the next 15-30 minutes.

## 2018-09-09 NOTE — Progress Notes (Signed)
Vecuronium turned off at this time for planned transition to withdrawal/comfort care at [facility] [place] with family.

## 2018-09-09 NOTE — Progress Notes (Signed)
[facility] Progress Note Patient Name: Toni Peterson DOB: 1950-02-11 MRN: [id number]   Date of Service  2018-08-24  HPI/Events of Note  Hypoxemia - Sat = 84-87%. Currently on 100%/P 12.  eICU Interventions  Will order: 1. Portable CXR STAT.      Intervention Category Major Interventions: Hypoxemia - evaluation and management  Telena Peyser Eugene Aug 24, 2018, 12:28 AM

## 2018-09-09 NOTE — Telephone Encounter (Signed)
Dr. Alvy Bimler, advise refill

## 2018-09-09 NOTE — Progress Notes (Signed)
[facility] Progress Note Patient Name: Toni Peterson DOB: 1951/01/06 MRN: [id number]   Date of Service  09-01-2018  HPI/Events of Note  Now NMBed and sats appear to be worse. Sat = 71%. Now at ceiling dose of Norepinephrine IV infusion. Patient is full code.   eICU Interventions  Will order: 1. Lasix 40 mg IV X 1 now.  2. Increase ceiling on Norepinephrine IV infusion to 60 mcg/min. 3. Will ask ground team to evaluate at bedside. Inform family of deterioration and discuss GOC.     Intervention Category Major Interventions: Hypoxemia - evaluation and management  Toni Peterson 2018-09-01, 3:25 AM

## 2018-09-09 NOTE — Progress Notes (Signed)
Patient Asystole, time of death 81, DNR order in place. 2nd Nurse verification Berton Bon. Respiratory therapist at bedside to extubate patient.

## 2018-09-09 NOTE — Progress Notes (Signed)
I spoke with pt's daughter and son over the phone.  Confirmed their understanding of plan after discussion with Dr. Alvy Bimler.  They are in agreement that she would not want to be sustained on life support given her prognosis.  They have agreed to DNR status.  They would like to visit with their mother, and then transition to comfort measures.  Reviewed process with them, and explained that time course for dying process is difficult to predict.  They will communicate with nursing staff to make arrangements to visit.  Chesley Mires, MD [facility]/Critical [facility] 08/24/2018, 9:05 AM

## 2018-09-09 NOTE — Progress Notes (Signed)
eLink Physician-Brief Progress Note Patient Name: Toni Peterson DOB: Aug 06, 1950 MRN: [id number]   Date of Service  August 29, 2018  HPI/Events of Note  Portable CXR c/w worsening of bilateral patchy ill-defined opacities throughout both lungs, No pneumothorax.   eICU Interventions  Will order: 1. Increase PEEP to 14.      Intervention Category Major Interventions: Hypoxemia - evaluation and management  Hugo Lybrand Eugene 08/29/18, 1:08 AM

## 2018-09-09 NOTE — Progress Notes (Signed)
Notified Attending MD Halford Chessman of change in vital signs and notification of family to arrive as soon as possible, transition and comfort care orders placed.

## 2018-09-09 NOTE — Progress Notes (Signed)
   09/09/18 1100  Clinical Encounter Type  Visited With Family  Visit Type Initial;Psychological support;Spiritual support;Death  Referral From Nurse  Consult/Referral To Chaplain  Spiritual Encounters  Spiritual Needs Emotional;Grief support  Stress Factors  Family Stress Factors Loss   I provided grief support for thei patient's son, cousin and daughter-in-law who were present at the bedside.   Chaplain Shanon Ace M.Div., [facility]

## 2018-09-09 NOTE — Progress Notes (Signed)
[facility] Progress Note Patient Name: Toni Peterson DOB: 06-23-1950 MRN: [id number]   Date of Service  Sep 12, 2018  HPI/Events of Note  Multiple issues: 1. Neutropenia - WBC = 1.1 and 2. Thrombocytopenia.   eICU Interventions  Will order: 1. Granix 300 mcg [place] now and Q day X 3. 2. Transfuse 1 unit single donor platelets now.  3. CBC with platelets at 12 noon.          Toni Peterson,Toni Peterson 09/12/18, 5:13 AM

## 2018-09-09 NOTE — Progress Notes (Signed)
Nutrition Brief Note  Patient screened for MST and vent. Chart reviewed. Pt now transitioning to comfort care. Confirmed with RN. No further nutrition interventions warranted at this time.  Please re-consult as needed.     Jarome Matin, MS, RD, LDN, [facility] Inpatient Clinical Dietitian Pager # [phone] After hours/weekend pager # [phone]

## 2018-09-09 NOTE — Progress Notes (Addendum)
NAME:  Toni Peterson, MRN:  [id number], DOB:  05/24/1950, LOS: 1 ADMISSION DATE:  09/02/2018, CONSULTATION DATE:  08/11/2018 REFERRING MD:  Dr. Venora Maples EDP, CHIEF COMPLAINT:  Respiratory failure   Brief History   68 yo female with metastatic cervical cancer on palliative chemotherapy presented with fever neutropenia, hypoxia, pulmonary infiltrates from HCAP with septic shock and ARDS.   Past Medical History   Colon cancer, DM, Diverticulosis, Diastolic CHF, HLD, HTN, thyroid nodule  Significant Hospital Events   7/06 admitted, intubated 7/07 refractory shock, ARDS protocol, start nimbex  Consults:  Oncology 7/07 Cervical cancer  Procedures:  ETT 7/06 >> Rt port 7/06 >>   Significant Diagnostic Tests:  CTA chest 7/6 > negative for PE. Numerous new nodular and masslike pulmonary opacities with ill-defined margins throughout. These presumably represent metastatic disease, however very rapid evolution comparison to PET-CT dated 07/14/2018 is unusual and differential considerations of multifocal infection and organizing pneumonia (including secondary to drug toxicity) remain. Redemonstrated sclerotic osseous metastatic disease. CT abdomen/pelvis 7/6 > No definite acute CT abnormality of the abdomen or pelvis to explain pain. Unchanged moderate left hydronephrosis and proximal hydroureter. New mild right hydronephrosis and hydroureter without obvious distal obstructing calculus or lesion. Redemonstrated findings of advanced cervical malignancy, including Post curettage and resection appearance of the cervix, with fluid in the endometrial and cervical cavity and vagina, small volume ascites with hazy, nodular appearance of the omentum and peritoneal lining, likely reflecting peritoneal metastatic disease, and sclerotic osseous metastatic disease.  Micro Data:  COVID 7/6 > negative Blood 7/6 > Tracheal aspirate 7/6 > Pneumococcal Ag 7/6 > negative Legionella Ag 7/6 >   Antimicrobials:   Cefepime 7/6 > Vancomycin 7/6 >  Interim history/subjective:  On max dose levophed.  ARDS protocol.  Nimbex.  Objective   Blood pressure 116/60, pulse 96, temperature (!) 100.9 F (38.3 C), temperature source Axillary, resp. rate (!) 24, height 5\' 1"  (1.549 m), weight 68 kg, SpO2 (!) 88 %.    Vent Mode: PRVC FiO2 (%):  [80 %-100 %] 100 % Set Rate:  [24 bmp] 24 bmp Vt Set:  [380 mL-460 mL] 380 mL PEEP:  [8 cmH20-16 cmH20] 16 cmH20 Plateau Pressure:  [21 cmH20-33 cmH20] 33 cmH20   Intake/Output Summary (Last 24 hours) at 18-Aug-2018 0813 Last data filed at 08-18-2018 0500 Gross per 24 hour  Intake 3074.84 ml  Output 450 ml  Net 2624.84 ml   Filed Weights   08/26/2018 1144  Weight: 68 kg    Examination:  General - sedated Eyes - pupils reactive ENT - ETT in place Cardiac - regular rate/rhythm, no murmur Chest - coarse BS b/l Abdomen - soft, non tender, decreased bowel sounds Extremities - extremities cool, decreased muscle bulk Skin - no rashes Neuro - RASS - 5  CXR (reviewed by me) - b/l ASD  Resolved Hospital Problem list     Assessment & Plan:   Acute hypoxic respiratory failure with pulmonary infiltrates. Discussion: Chest image findings likely combination of pneumonia in setting of febrile neutropenia and metastatic lesions. Plan - full vent support - adjust PEEP/FiO2 per ARDS protocol - goal 8 cc/kg Vt >> didn't tolerate lower Vt due to acidosis - f/u CXR  Septic shock from HCAP. Plan - pressors to keep MAP > 65 - day 2 of ABx  Relative adrenal insufficiency. Plan - continue solu cortef while on pressors  AKI from ATN >> baseline creatinine 0.97 from June 2020. Hyperkalemia in setting of acidosis. Metabolic acidosis  with lactic acidosis. Plan - f/u BMET - monitor renal fx - would not be candidate for renal replacement  Chemotherapy induced pancytopenia. Stage 4 cervical cancer with hx of colon cancer. Plan - neutropenic precautions - granix  - f/u CBC - oncology consulted  DM type II. Plan - SSI  Moderate protein calorie malnutrition. Plan - tube feeds  Goals of care. Plan - very poor prognosis - would likely not be candidate for additional chemotherapy at this point - Dr. Alvy Bimler will d/w family >> best option seems to be transition to comfort measures   Best practice:  Diet: tube feeds DVT prophylaxis: SCDs GI prophylaxis: Protonix Mobility: bed rest Code Status: Full Disposition: ICU  Labs    CMP Latest Ref Rng & Units 09-03-18 08/13/2018 08/19/2018  Glucose 70 - 99 mg/dL 102(H) 156(H) -  BUN 8 - 23 mg/dL 46(H) 40(H) -  Creatinine 0.44 - 1.00 mg/dL 1.30(H) 1.13(H) -  Sodium 135 - 145 mmol/L 132(L) 130(L) -  Potassium 3.5 - 5.1 mmol/L 5.5(H) 5.4(H) 6.1(H)  Chloride 98 - 111 mmol/L 104 101 -  CO2 22 - 32 mmol/L 17(L) 20(L) -  Calcium 8.9 - 10.3 mg/dL 7.3(L) 7.8(L) -  Total Protein 6.5 - 8.1 g/dL - - -  Total Bilirubin 0.3 - 1.2 mg/dL - - -  Alkaline Phos 38 - 126 U/L - - -  AST 15 - 41 U/L - - -  ALT 0 - 44 U/L - - -   CBC Latest Ref Rng & Units 09/03/2018 08/11/2018 08/20/2018  WBC 4.0 - 10.5 K/uL 1.1(LL) 1.3(LL) 1.5(L)  Hemoglobin 12.0 - 15.0 g/dL 7.1(L) 8.2(L) 9.5(L)  Hematocrit 36.0 - 46.0 % 22.8(L) 26.6(L) 29.2(L)  Platelets 150 - 400 K/uL 7(LL) 17(LL) 32(L)   ABG    Component Value Date/Time   PHART 7.251 (L) 09-03-2018 0330   PCO2ART 39.4 03-Sep-2018 0330   PO2ART 57.9 (L) 09-03-2018 0330   HCO3 16.4 (L) 2018/09/03 0330   TCO2 25 02/28/2018 0602   ACIDBASEDEF 9.2 (H) 2018-09-03 0330   O2SAT 77.9 September 03, 2018 0330   CBG (last 3)  Recent Labs    08/30/2018 2324 09/03/2018 0327 2018/09/03 0802  GLUCAP 113* 95 82    CC time 36 minutes  D/w Dr. Rogelia Boga, MD [place] 09/03/18, 8:25 AM

## 2018-09-09 NOTE — Progress Notes (Signed)
Family at bedside, Daughter in [place] and cousin. Chaplain notified to provide grief support. 1 pair of gold in color earrings and 1 gold in color necklace sent home with daughter in law Jocelyn-Murphy Alllen. No other patient belongings.   Funeral Home information obtained by family.

## 2018-09-09 DEATH — deceased

## 2018-10-05 ENCOUNTER — Ambulatory Visit: Payer: Self-pay | Admitting: Radiation Oncology

## 2019-07-29 IMAGING — CT CT ABDOMEN AND PELVIS WITH CONTRAST
3 of 11 series · 11 of 46 positions shown, 17 images · IV contrast (ISOVUE)
Comparison: Same-day abdominal radiographs, PET-CT, 07/14/2018

CLINICAL DATA: Abnormal bowel loops on plain film, rule out bowel
obstruction, nausea, vomiting, abdominal pain

EXAM:
CT ABDOMEN AND PELVIS WITH CONTRAST
TECHNIQUE: Multidetector CT imaging of the abdomen and pelvis was performed
using the standard protocol following bolus administration of
intravenous contrast.
CONTRAST:  100mL OMNIPAQUE IOHEXOL 350 MG/ML SOLN

[Series 6: thins · axial · 0.79mm/px · z∈[-206,-86]mm · 5 of 241 slices shown]
[im 18/241  soft-tissue]
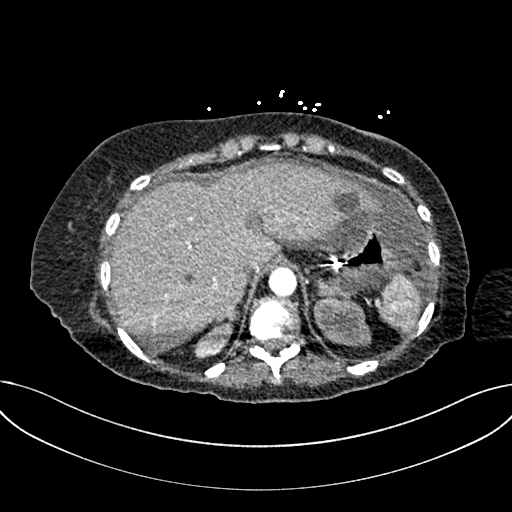
[im 52/241  soft-tissue]
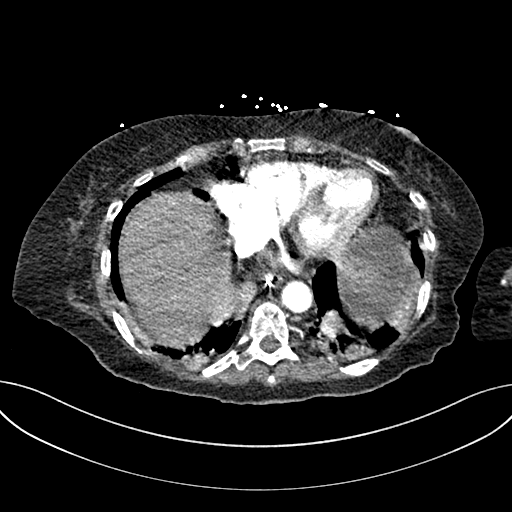
[im 86/241  soft-tissue]
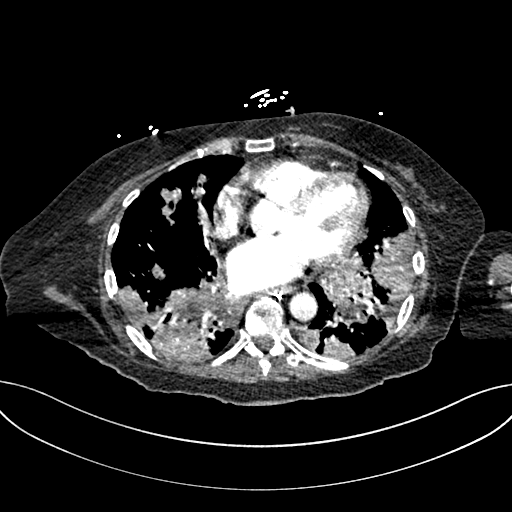
[im 103/241  soft-tissue]
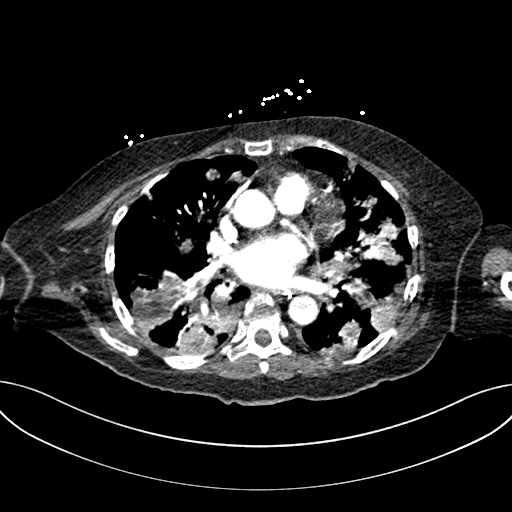
[im 138/241  soft-tissue]
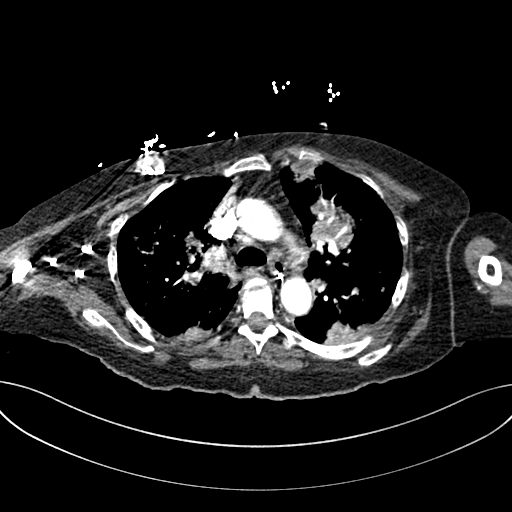

[Series 7: axial st · axial · 0.90mm/px · z∈[-508,-208]mm · 4 of 100 slices shown, 9 images]
[im 20/100  soft-tissue]
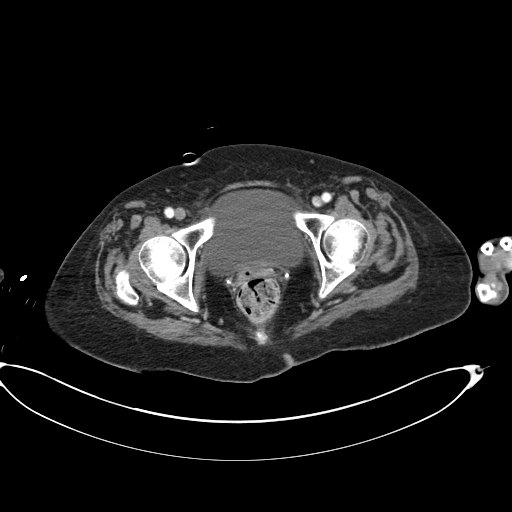
[im 20/100  lung]
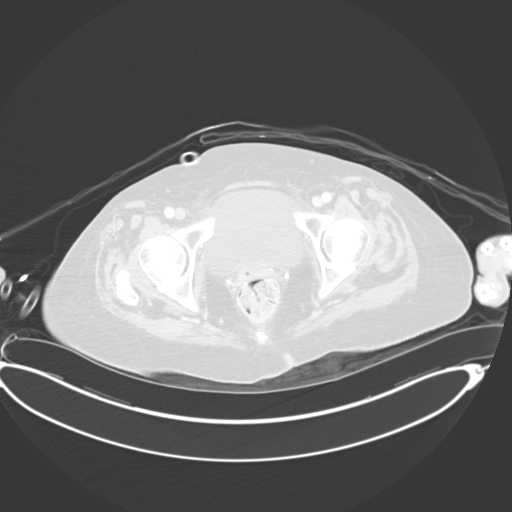
[im 20/100  bone]
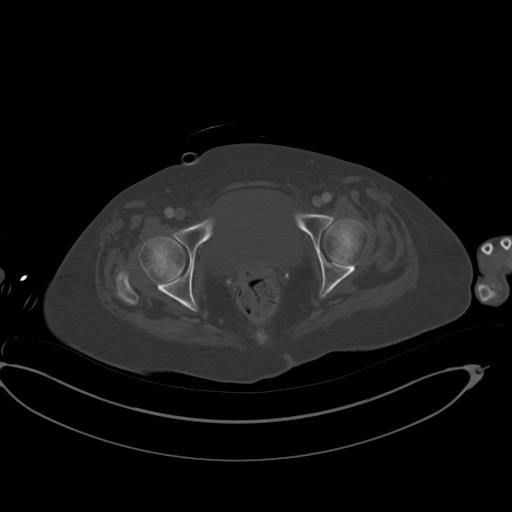
[im 40/100  soft-tissue]
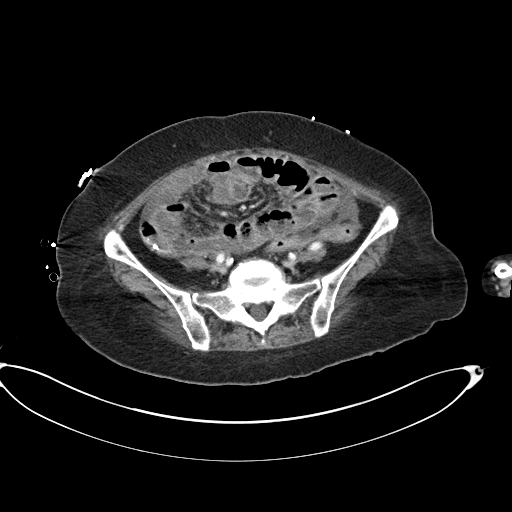
[im 40/100  lung]
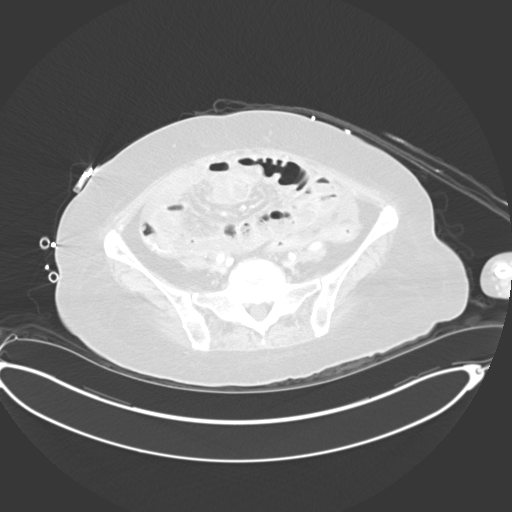
[im 60/100  soft-tissue]
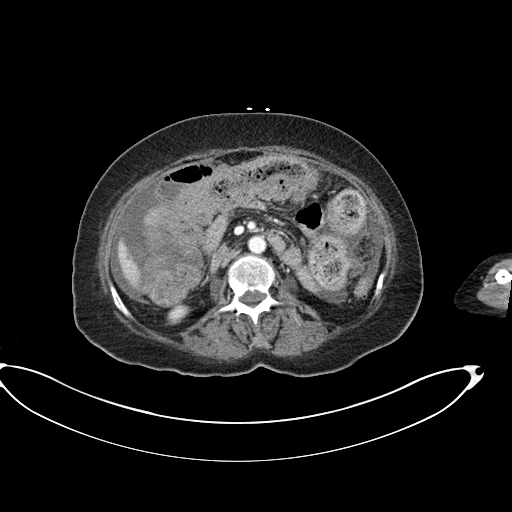
[im 60/100  lung]
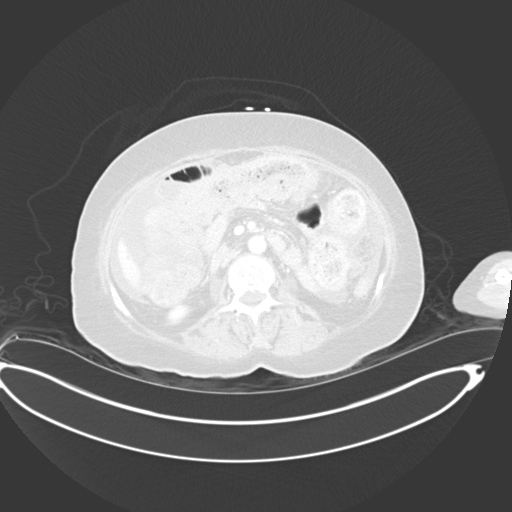
[im 80/100  soft-tissue]
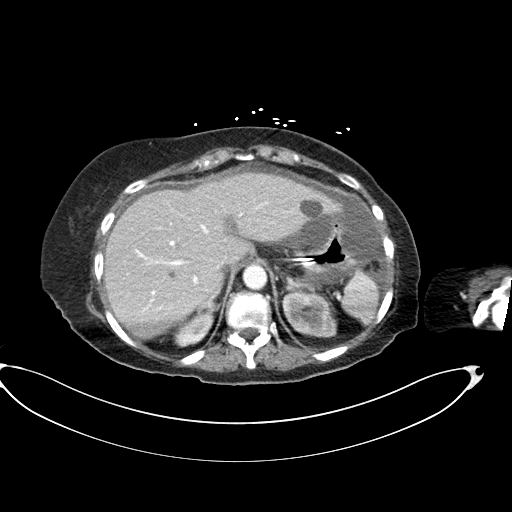
[im 80/100  lung]
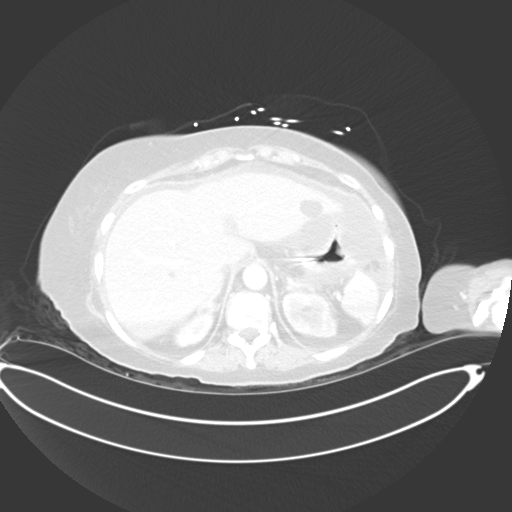

[Series 15: coronal st · coronal · 0.71mm/px · 2 of 133 slices shown, 3 images]
[im 45/133  soft-tissue]
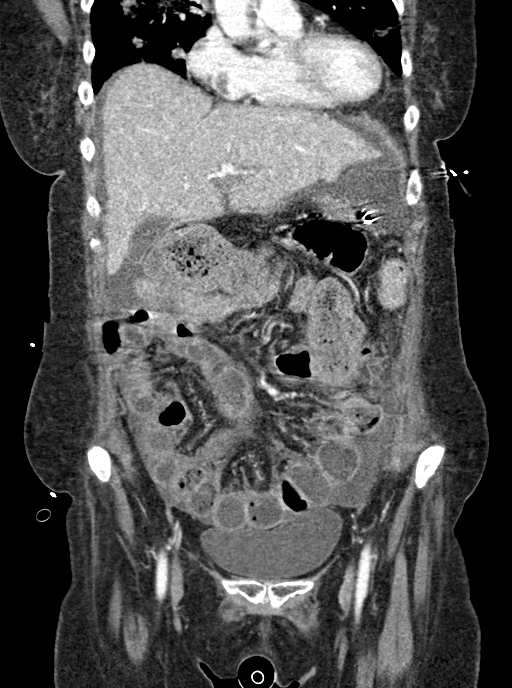
[im 45/133  bone]
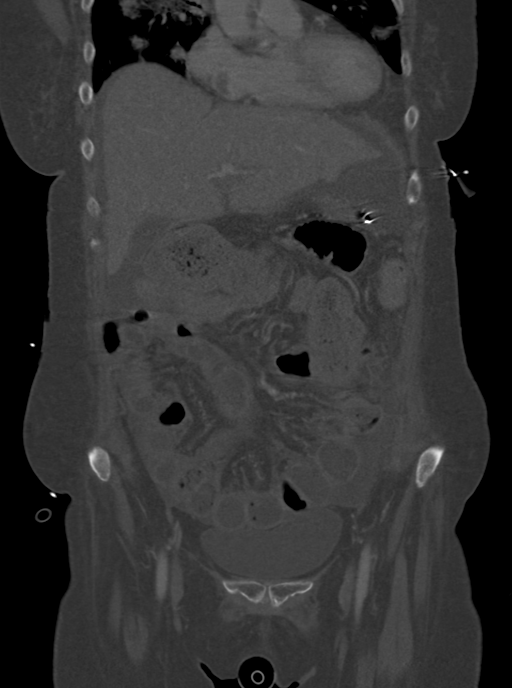
[im 89/133  soft-tissue]
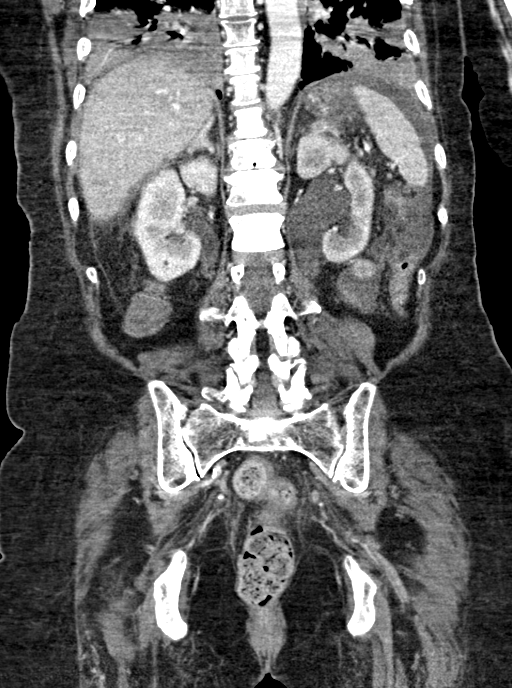

[11 of 46 positions shown; findings below may reference images not displayed]

FINDINGS: Lower chest: Please see separately reported CT examination of the
chest.

Hepatobiliary: No solid liver abnormality is seen. No gallstones,
gallbladder wall thickening, or biliary dilatation.

Pancreas: Unremarkable. No pancreatic ductal dilatation or
surrounding inflammatory changes.

Spleen: Normal in size without significant abnormality.

Adrenals/Urinary Tract: Adrenal glands are unremarkable. Unchanged
moderate left hydronephrosis and proximal hydroureter. New mild
right hydronephrosis and hydroureter without obvious distal
obstructing calculus or lesion.

Stomach/Bowel: Stomach is within normal limits. Appendix appears
normal. No evidence of bowel wall thickening, distention, or
inflammatory changes.

Vascular/Lymphatic: Aortic atherosclerosis. No enlarged abdominal or
pelvic lymph nodes.

Reproductive: Post curettage and resection appearance of the cervix,
with fluid in the endometrial and cervical cavity and vagina.

Other: No abdominal wall hernia or abnormality. Small volume
ascites. Hazy, nodular appearance of the omentum and peritoneal
lining, particularly in the left upper quadrant (series 7, image
34).

Musculoskeletal: Redemonstrated sclerotic osseous metastatic
disease.
IMPRESSION: 1. No definite acute CT abnormality of the abdomen or pelvis to
explain pain.

2. Unchanged moderate left hydronephrosis and proximal hydroureter.
New mild right hydronephrosis and hydroureter without obvious distal
obstructing calculus or lesion.

3. Redemonstrated findings of advanced cervical malignancy,
including Post curettage and resection appearance of the cervix,
with fluid in the endometrial and cervical cavity and vagina, small
volume ascites with hazy, nodular appearance of the omentum and
peritoneal lining, likely reflecting peritoneal metastatic disease,
and sclerotic osseous metastatic disease.

## 2019-07-30 IMAGING — DX PORTABLE CHEST - 1 VIEW
1 series · 1 of 1 positions shown · non-contrast
Comparison: Radiograph and chest CT yesterday.

CLINICAL DATA: Endotracheal tube position. Acute respiratory
failure. Hypoxemia.

EXAM:
PORTABLE CHEST 1 VIEW

[chest ap]
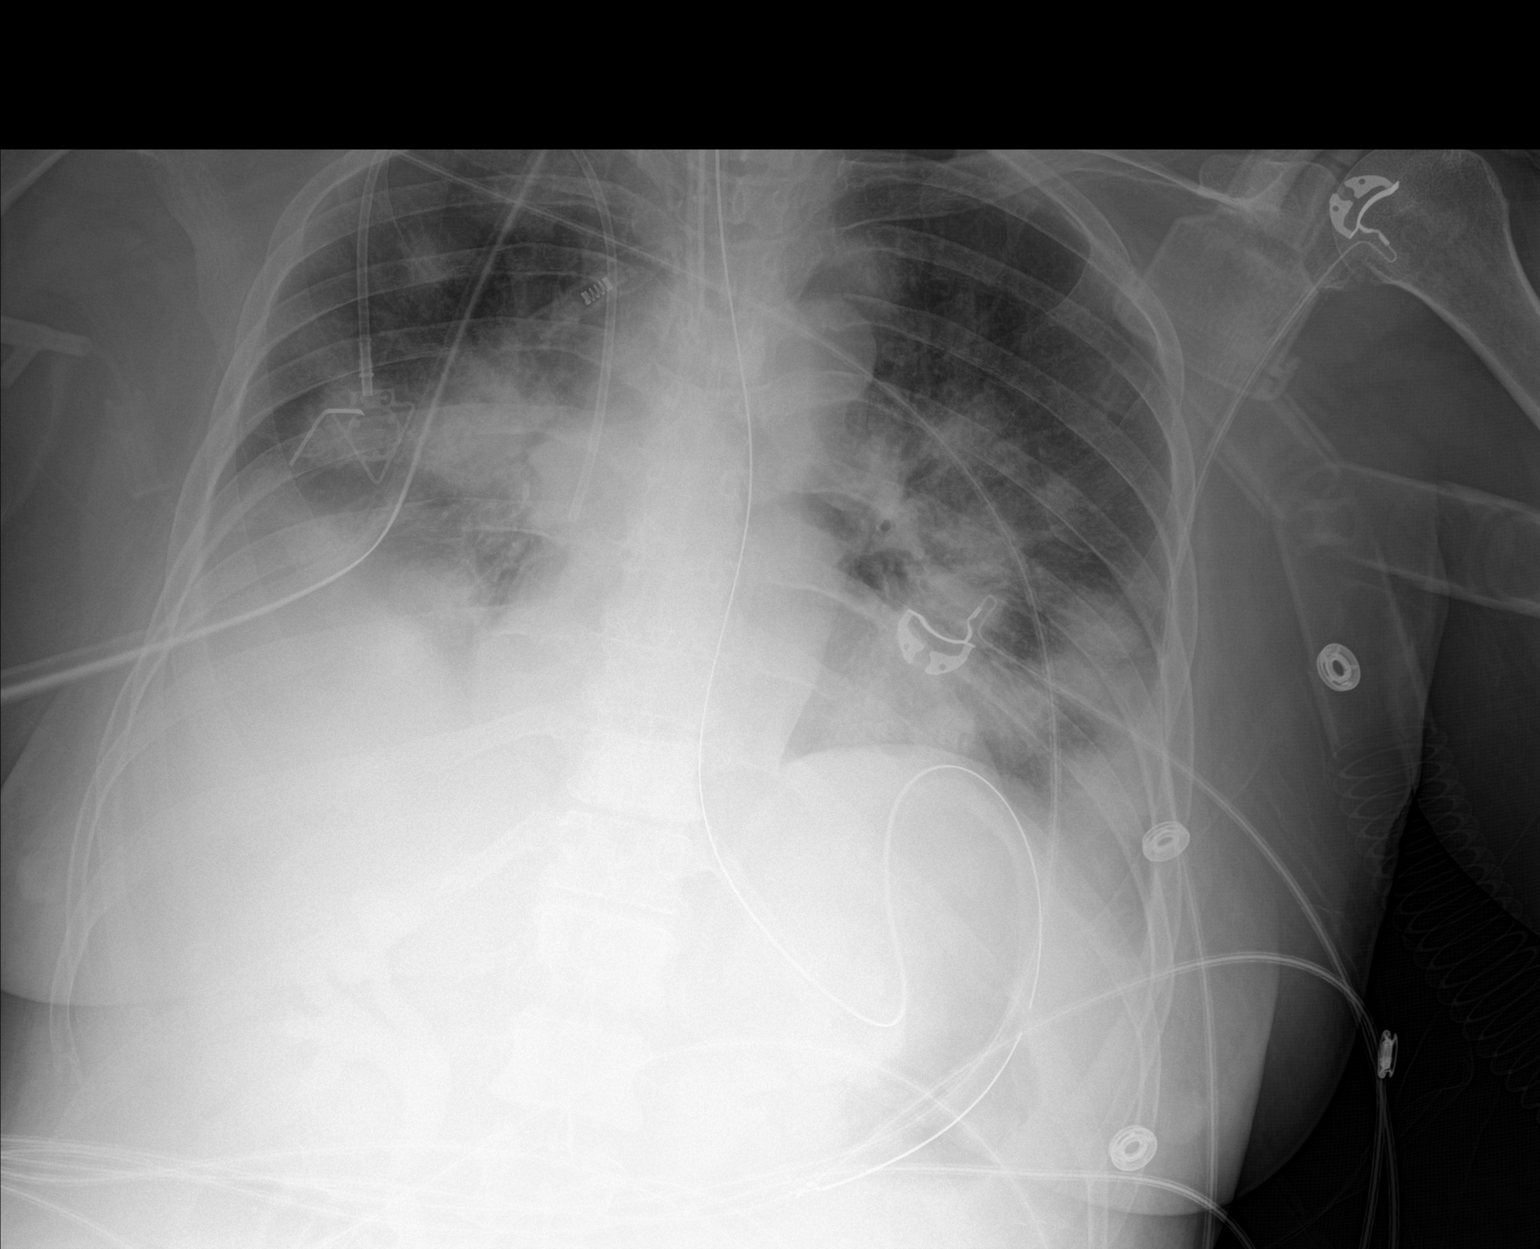

[1 of 1 positions shown; findings below may reference images not displayed]

FINDINGS: Endotracheal tube tip 18 mm from the carina. Tip and side port of
the enteric tube below the diaphragm in the stomach. Right chest
port remains in place.

Progressive bibasilar airspace opacities since prior exam.
Additional multifocal nodular masslike densities throughout both
lungs again seen. No pneumothorax. No evidence of pulmonary edema.
IMPRESSION: 1. Progression in bibasilar opacities since prior exam which may be
progression of airspace disease or development of pleural effusions.
2. Additional multifocal pulmonary opacities are grossly unchanged.
